# Patient Record
Sex: Male | Born: 1945 | Race: White | Hispanic: No | Smoking: Former smoker
Health system: Southern US, Community
[De-identification: ages and names within clinical notes are randomized; demographics above are authoritative.]

## PROBLEM LIST (undated history)

## (undated) DIAGNOSIS — R0601 Orthopnea: Secondary | ICD-10-CM

## (undated) DIAGNOSIS — I5032 Chronic diastolic (congestive) heart failure: Secondary | ICD-10-CM

## (undated) DIAGNOSIS — M542 Cervicalgia: Secondary | ICD-10-CM

## (undated) DIAGNOSIS — M5090 Cervical disc disorder, unspecified, unspecified cervical region: Secondary | ICD-10-CM

## (undated) DIAGNOSIS — E039 Hypothyroidism, unspecified: Secondary | ICD-10-CM

## (undated) DIAGNOSIS — M549 Dorsalgia, unspecified: Secondary | ICD-10-CM

## (undated) DIAGNOSIS — I2699 Other pulmonary embolism without acute cor pulmonale: Secondary | ICD-10-CM

## (undated) DIAGNOSIS — I2510 Atherosclerotic heart disease of native coronary artery without angina pectoris: Secondary | ICD-10-CM

## (undated) DIAGNOSIS — I119 Hypertensive heart disease without heart failure: Secondary | ICD-10-CM

## (undated) DIAGNOSIS — L309 Dermatitis, unspecified: Secondary | ICD-10-CM

## (undated) DIAGNOSIS — D649 Anemia, unspecified: Secondary | ICD-10-CM

## (undated) DIAGNOSIS — I499 Cardiac arrhythmia, unspecified: Secondary | ICD-10-CM

## (undated) DIAGNOSIS — I739 Peripheral vascular disease, unspecified: Secondary | ICD-10-CM

## (undated) DIAGNOSIS — S82409A Unspecified fracture of shaft of unspecified fibula, initial encounter for closed fracture: Secondary | ICD-10-CM

## (undated) DIAGNOSIS — F329 Major depressive disorder, single episode, unspecified: Secondary | ICD-10-CM

## (undated) DIAGNOSIS — I4892 Unspecified atrial flutter: Secondary | ICD-10-CM

## (undated) DIAGNOSIS — F419 Anxiety disorder, unspecified: Secondary | ICD-10-CM

## (undated) DIAGNOSIS — F32A Depression, unspecified: Secondary | ICD-10-CM

## (undated) DIAGNOSIS — I639 Cerebral infarction, unspecified: Secondary | ICD-10-CM

## (undated) DIAGNOSIS — S82892A Other fracture of left lower leg, initial encounter for closed fracture: Secondary | ICD-10-CM

## (undated) DIAGNOSIS — R011 Cardiac murmur, unspecified: Secondary | ICD-10-CM

## (undated) DIAGNOSIS — N183 Chronic kidney disease, stage 3 unspecified: Secondary | ICD-10-CM

## (undated) DIAGNOSIS — I480 Paroxysmal atrial fibrillation: Secondary | ICD-10-CM

## (undated) DIAGNOSIS — G629 Polyneuropathy, unspecified: Secondary | ICD-10-CM

## (undated) DIAGNOSIS — G4733 Obstructive sleep apnea (adult) (pediatric): Secondary | ICD-10-CM

## (undated) DIAGNOSIS — R609 Edema, unspecified: Secondary | ICD-10-CM

## (undated) DIAGNOSIS — F4310 Post-traumatic stress disorder, unspecified: Secondary | ICD-10-CM

## (undated) DIAGNOSIS — R0600 Dyspnea, unspecified: Secondary | ICD-10-CM

## (undated) DIAGNOSIS — I779 Disorder of arteries and arterioles, unspecified: Secondary | ICD-10-CM

## (undated) DIAGNOSIS — M1990 Unspecified osteoarthritis, unspecified site: Secondary | ICD-10-CM

## (undated) DIAGNOSIS — N200 Calculus of kidney: Secondary | ICD-10-CM

## (undated) DIAGNOSIS — Z972 Presence of dental prosthetic device (complete) (partial): Secondary | ICD-10-CM

## (undated) DIAGNOSIS — G8929 Other chronic pain: Secondary | ICD-10-CM

## (undated) HISTORY — DX: Other fracture of left lower leg, initial encounter for closed fracture: S82.892A

## (undated) HISTORY — DX: Paroxysmal atrial fibrillation: I48.0

## (undated) HISTORY — DX: Hypertensive heart disease without heart failure: I11.9

## (undated) HISTORY — DX: Atherosclerotic heart disease of native coronary artery without angina pectoris: I25.10

## (undated) HISTORY — DX: Polyneuropathy, unspecified: G62.9

## (undated) HISTORY — DX: Calculus of kidney: N20.0

## (undated) HISTORY — DX: Chronic diastolic (congestive) heart failure: I50.32

## (undated) HISTORY — DX: Cervical disc disorder, unspecified, unspecified cervical region: M50.90

## (undated) HISTORY — DX: Dorsalgia, unspecified: M54.9

## (undated) HISTORY — DX: Chronic kidney disease, stage 3 (moderate): N18.3

## (undated) HISTORY — DX: Unspecified atrial flutter: I48.92

## (undated) HISTORY — DX: Chronic kidney disease, stage 3 unspecified: N18.30

## (undated) HISTORY — DX: Peripheral vascular disease, unspecified: I73.9

## (undated) HISTORY — PX: HEMORRHOID SURGERY: SHX153

## (undated) HISTORY — PX: HERNIA REPAIR: SHX51

## (undated) HISTORY — DX: Post-traumatic stress disorder, unspecified: F43.10

## (undated) HISTORY — DX: Other chronic pain: G89.29

## (undated) HISTORY — DX: Cervicalgia: M54.2

## (undated) HISTORY — DX: Obstructive sleep apnea (adult) (pediatric): G47.33

## (undated) HISTORY — PX: ROTATOR CUFF REPAIR: SHX139

## (undated) HISTORY — DX: Disorder of arteries and arterioles, unspecified: I77.9

## (undated) HISTORY — PX: ABLATION: SHX5711

## (undated) HISTORY — DX: Unspecified fracture of shaft of unspecified fibula, initial encounter for closed fracture: S82.409A

---

## 1993-11-29 HISTORY — PX: CORONARY ARTERY BYPASS GRAFT: SHX141

## 1994-06-21 ENCOUNTER — Encounter: Payer: Self-pay | Admitting: Cardiology

## 2001-05-12 ENCOUNTER — Inpatient Hospital Stay (HOSPITAL_COMMUNITY): Admission: EM | Admit: 2001-05-12 | Discharge: 2001-05-15 | Payer: Self-pay | Admitting: Emergency Medicine

## 2001-05-12 ENCOUNTER — Encounter: Payer: Self-pay | Admitting: Emergency Medicine

## 2004-09-28 ENCOUNTER — Ambulatory Visit: Payer: Self-pay | Admitting: Oncology

## 2004-12-23 ENCOUNTER — Ambulatory Visit: Payer: Self-pay | Admitting: Oncology

## 2004-12-30 ENCOUNTER — Ambulatory Visit: Payer: Self-pay | Admitting: Oncology

## 2005-04-23 ENCOUNTER — Ambulatory Visit: Payer: Self-pay | Admitting: Internal Medicine

## 2005-04-29 ENCOUNTER — Ambulatory Visit: Payer: Self-pay | Admitting: Internal Medicine

## 2005-08-20 ENCOUNTER — Ambulatory Visit: Payer: Self-pay | Admitting: Oncology

## 2005-08-29 ENCOUNTER — Ambulatory Visit: Payer: Self-pay | Admitting: Oncology

## 2006-02-16 ENCOUNTER — Ambulatory Visit: Payer: Self-pay | Admitting: Oncology

## 2006-02-27 ENCOUNTER — Ambulatory Visit: Payer: Self-pay | Admitting: Oncology

## 2006-03-29 ENCOUNTER — Ambulatory Visit: Payer: Self-pay | Admitting: Oncology

## 2006-06-30 ENCOUNTER — Ambulatory Visit: Payer: Self-pay | Admitting: Oncology

## 2006-07-30 ENCOUNTER — Ambulatory Visit: Payer: Self-pay | Admitting: Oncology

## 2006-09-05 ENCOUNTER — Ambulatory Visit: Payer: Self-pay | Admitting: Oncology

## 2006-09-29 ENCOUNTER — Ambulatory Visit: Payer: Self-pay | Admitting: Oncology

## 2006-10-27 ENCOUNTER — Other Ambulatory Visit: Payer: Self-pay

## 2006-10-27 ENCOUNTER — Inpatient Hospital Stay: Payer: Self-pay | Admitting: Internal Medicine

## 2006-12-05 ENCOUNTER — Ambulatory Visit: Payer: Self-pay | Admitting: Oncology

## 2006-12-30 ENCOUNTER — Ambulatory Visit: Payer: Self-pay | Admitting: Oncology

## 2007-01-28 ENCOUNTER — Ambulatory Visit: Payer: Self-pay | Admitting: Internal Medicine

## 2007-02-07 ENCOUNTER — Ambulatory Visit: Payer: Self-pay | Admitting: Oncology

## 2007-02-14 ENCOUNTER — Other Ambulatory Visit: Payer: Self-pay

## 2007-02-14 ENCOUNTER — Emergency Department: Payer: Self-pay | Admitting: Emergency Medicine

## 2007-02-28 ENCOUNTER — Ambulatory Visit: Payer: Self-pay | Admitting: Oncology

## 2007-02-28 ENCOUNTER — Ambulatory Visit: Payer: Self-pay | Admitting: Internal Medicine

## 2007-03-30 ENCOUNTER — Ambulatory Visit: Payer: Self-pay | Admitting: Oncology

## 2007-03-30 ENCOUNTER — Ambulatory Visit: Payer: Self-pay | Admitting: Family Medicine

## 2007-03-30 ENCOUNTER — Ambulatory Visit: Payer: Self-pay | Admitting: Internal Medicine

## 2007-03-31 ENCOUNTER — Ambulatory Visit: Payer: Self-pay | Admitting: Cardiology

## 2007-04-30 ENCOUNTER — Ambulatory Visit: Payer: Self-pay | Admitting: Internal Medicine

## 2007-05-02 ENCOUNTER — Ambulatory Visit: Payer: Self-pay | Admitting: Internal Medicine

## 2007-05-17 ENCOUNTER — Encounter: Payer: Self-pay | Admitting: Family Medicine

## 2007-05-30 ENCOUNTER — Encounter: Payer: Self-pay | Admitting: Family Medicine

## 2007-05-30 ENCOUNTER — Ambulatory Visit: Payer: Self-pay | Admitting: Internal Medicine

## 2007-06-13 ENCOUNTER — Ambulatory Visit: Payer: Self-pay | Admitting: Internal Medicine

## 2007-06-30 ENCOUNTER — Encounter: Payer: Self-pay | Admitting: Family Medicine

## 2007-06-30 ENCOUNTER — Ambulatory Visit: Payer: Self-pay | Admitting: Internal Medicine

## 2007-07-31 ENCOUNTER — Ambulatory Visit: Payer: Self-pay | Admitting: Internal Medicine

## 2007-08-30 ENCOUNTER — Ambulatory Visit: Payer: Self-pay | Admitting: Internal Medicine

## 2007-09-30 ENCOUNTER — Ambulatory Visit: Payer: Self-pay | Admitting: Internal Medicine

## 2007-10-30 ENCOUNTER — Ambulatory Visit: Payer: Self-pay | Admitting: Internal Medicine

## 2007-11-30 ENCOUNTER — Ambulatory Visit: Payer: Self-pay | Admitting: Internal Medicine

## 2007-12-05 ENCOUNTER — Ambulatory Visit: Payer: Self-pay | Admitting: Internal Medicine

## 2007-12-31 ENCOUNTER — Ambulatory Visit: Payer: Self-pay | Admitting: Internal Medicine

## 2008-01-02 ENCOUNTER — Ambulatory Visit: Payer: Self-pay | Admitting: Family Medicine

## 2008-01-09 ENCOUNTER — Ambulatory Visit: Payer: Self-pay | Admitting: Internal Medicine

## 2008-01-28 ENCOUNTER — Ambulatory Visit: Payer: Self-pay | Admitting: Internal Medicine

## 2008-02-28 ENCOUNTER — Ambulatory Visit: Payer: Self-pay | Admitting: Internal Medicine

## 2008-03-29 ENCOUNTER — Ambulatory Visit: Payer: Self-pay | Admitting: Internal Medicine

## 2008-04-29 ENCOUNTER — Ambulatory Visit: Payer: Self-pay | Admitting: Internal Medicine

## 2008-05-29 ENCOUNTER — Ambulatory Visit: Payer: Self-pay | Admitting: Internal Medicine

## 2008-05-29 ENCOUNTER — Ambulatory Visit: Payer: Self-pay | Admitting: Urology

## 2008-06-12 ENCOUNTER — Ambulatory Visit: Payer: Self-pay | Admitting: Urology

## 2008-06-29 ENCOUNTER — Ambulatory Visit: Payer: Self-pay | Admitting: Internal Medicine

## 2008-07-23 ENCOUNTER — Ambulatory Visit: Payer: Self-pay | Admitting: Internal Medicine

## 2008-07-30 ENCOUNTER — Ambulatory Visit: Payer: Self-pay | Admitting: Internal Medicine

## 2008-08-29 ENCOUNTER — Ambulatory Visit: Payer: Self-pay | Admitting: Internal Medicine

## 2008-09-29 ENCOUNTER — Ambulatory Visit: Payer: Self-pay | Admitting: Internal Medicine

## 2008-10-29 ENCOUNTER — Ambulatory Visit: Payer: Self-pay | Admitting: Internal Medicine

## 2008-10-31 ENCOUNTER — Ambulatory Visit: Payer: Self-pay | Admitting: Internal Medicine

## 2008-11-26 ENCOUNTER — Inpatient Hospital Stay: Payer: Self-pay | Admitting: Specialist

## 2008-11-26 ENCOUNTER — Ambulatory Visit: Payer: Self-pay | Admitting: Cardiovascular Disease

## 2008-11-29 ENCOUNTER — Ambulatory Visit: Payer: Medicare Other | Admitting: Internal Medicine

## 2008-11-29 DIAGNOSIS — I2699 Other pulmonary embolism without acute cor pulmonale: Secondary | ICD-10-CM

## 2008-11-29 HISTORY — DX: Other pulmonary embolism without acute cor pulmonale: I26.99

## 2008-12-02 ENCOUNTER — Ambulatory Visit: Payer: Self-pay | Admitting: Cardiovascular Disease

## 2008-12-02 LAB — CONVERTED CEMR LAB
MCV: 90.1 fL (ref 78.0–100.0)
Platelets: 162 10*3/uL (ref 150–400)
WBC: 8.3 10*3/uL (ref 4.0–10.5)

## 2008-12-09 ENCOUNTER — Ambulatory Visit: Payer: Self-pay | Admitting: Internal Medicine

## 2008-12-13 ENCOUNTER — Inpatient Hospital Stay: Payer: Medicare Other | Admitting: Internal Medicine

## 2008-12-30 ENCOUNTER — Ambulatory Visit: Payer: Medicare Other | Admitting: Internal Medicine

## 2009-01-02 ENCOUNTER — Ambulatory Visit: Payer: Medicare Other | Admitting: Family Medicine

## 2009-01-06 ENCOUNTER — Ambulatory Visit: Payer: Self-pay | Admitting: Cardiology

## 2009-01-06 ENCOUNTER — Inpatient Hospital Stay: Payer: Medicare Other | Admitting: Internal Medicine

## 2009-01-09 ENCOUNTER — Ambulatory Visit: Payer: Self-pay | Admitting: Cardiology

## 2009-01-09 ENCOUNTER — Ambulatory Visit: Payer: Self-pay | Admitting: Pulmonary Disease

## 2009-01-09 ENCOUNTER — Inpatient Hospital Stay (HOSPITAL_COMMUNITY): Admission: AD | Admit: 2009-01-09 | Discharge: 2009-01-26 | Payer: Self-pay | Admitting: Pulmonary Disease

## 2009-01-10 ENCOUNTER — Ambulatory Visit: Payer: Medicare Other | Admitting: Internal Medicine

## 2009-01-14 ENCOUNTER — Encounter: Payer: Self-pay | Admitting: Cardiology

## 2009-01-16 ENCOUNTER — Encounter: Payer: Self-pay | Admitting: Internal Medicine

## 2009-01-16 ENCOUNTER — Ambulatory Visit: Payer: Self-pay | Admitting: Vascular Surgery

## 2009-01-24 ENCOUNTER — Encounter: Payer: Self-pay | Admitting: Cardiology

## 2009-01-27 ENCOUNTER — Ambulatory Visit: Payer: Medicare Other | Admitting: Internal Medicine

## 2009-01-30 ENCOUNTER — Ambulatory Visit: Payer: Medicare Other | Admitting: Internal Medicine

## 2009-01-31 ENCOUNTER — Ambulatory Visit: Payer: Self-pay | Admitting: Internal Medicine

## 2009-01-31 ENCOUNTER — Ambulatory Visit: Payer: Medicare Other | Admitting: Family Medicine

## 2009-02-17 ENCOUNTER — Ambulatory Visit: Payer: Self-pay | Admitting: Internal Medicine

## 2009-02-25 ENCOUNTER — Ambulatory Visit: Payer: Medicare Other | Admitting: Family Medicine

## 2009-02-27 ENCOUNTER — Ambulatory Visit: Payer: Medicare Other | Admitting: Internal Medicine

## 2009-03-28 ENCOUNTER — Ambulatory Visit: Payer: Medicare Other | Admitting: Family Medicine

## 2009-03-29 ENCOUNTER — Ambulatory Visit: Payer: Medicare Other | Admitting: Internal Medicine

## 2009-04-03 ENCOUNTER — Ambulatory Visit: Payer: Medicare Other | Admitting: Internal Medicine

## 2009-04-07 ENCOUNTER — Ambulatory Visit: Payer: Medicare Other | Admitting: Family Medicine

## 2009-04-29 ENCOUNTER — Ambulatory Visit: Payer: Medicare Other | Admitting: Internal Medicine

## 2009-05-01 ENCOUNTER — Ambulatory Visit: Payer: Medicare Other | Admitting: Internal Medicine

## 2009-05-27 ENCOUNTER — Ambulatory Visit: Payer: Medicare Other | Admitting: Family Medicine

## 2009-05-29 ENCOUNTER — Ambulatory Visit: Payer: Medicare Other | Admitting: Internal Medicine

## 2009-06-16 ENCOUNTER — Ambulatory Visit: Payer: Self-pay | Admitting: Cardiovascular Disease

## 2009-06-16 DIAGNOSIS — I10 Essential (primary) hypertension: Secondary | ICD-10-CM

## 2009-06-16 DIAGNOSIS — M79609 Pain in unspecified limb: Secondary | ICD-10-CM

## 2009-06-16 DIAGNOSIS — I4892 Unspecified atrial flutter: Secondary | ICD-10-CM

## 2009-06-16 DIAGNOSIS — I25810 Atherosclerosis of coronary artery bypass graft(s) without angina pectoris: Secondary | ICD-10-CM | POA: Insufficient documentation

## 2009-06-29 ENCOUNTER — Ambulatory Visit: Payer: Medicare Other | Admitting: Internal Medicine

## 2009-07-14 ENCOUNTER — Encounter: Payer: Self-pay | Admitting: *Deleted

## 2009-07-16 ENCOUNTER — Other Ambulatory Visit: Payer: Medicare Other | Admitting: Family Medicine

## 2009-07-18 ENCOUNTER — Ambulatory Visit: Payer: Medicare Other | Admitting: Internal Medicine

## 2009-07-30 ENCOUNTER — Ambulatory Visit: Payer: Medicare Other | Admitting: Internal Medicine

## 2009-08-11 ENCOUNTER — Encounter: Payer: Self-pay | Admitting: Cardiology

## 2009-08-12 ENCOUNTER — Ambulatory Visit: Payer: Self-pay | Admitting: Cardiology

## 2009-08-12 DIAGNOSIS — I5032 Chronic diastolic (congestive) heart failure: Secondary | ICD-10-CM

## 2009-08-12 DIAGNOSIS — R609 Edema, unspecified: Secondary | ICD-10-CM

## 2009-08-21 ENCOUNTER — Encounter: Payer: Self-pay | Admitting: Cardiology

## 2009-08-26 ENCOUNTER — Ambulatory Visit: Payer: Self-pay | Admitting: Cardiovascular Disease

## 2009-08-26 ENCOUNTER — Encounter: Payer: Self-pay | Admitting: Cardiology

## 2009-08-26 ENCOUNTER — Ambulatory Visit: Payer: Self-pay

## 2009-08-28 ENCOUNTER — Encounter (INDEPENDENT_AMBULATORY_CARE_PROVIDER_SITE_OTHER): Payer: Self-pay | Admitting: *Deleted

## 2009-08-28 LAB — CONVERTED CEMR LAB
Chloride: 98 meq/L (ref 96–112)
Creatinine, Ser: 1.93 mg/dL — ABNORMAL HIGH (ref 0.40–1.50)

## 2009-08-29 ENCOUNTER — Ambulatory Visit: Payer: Medicare Other | Admitting: Internal Medicine

## 2009-09-08 ENCOUNTER — Ambulatory Visit: Payer: Self-pay | Admitting: Cardiology

## 2009-09-08 DIAGNOSIS — I4891 Unspecified atrial fibrillation: Secondary | ICD-10-CM | POA: Insufficient documentation

## 2009-09-08 DIAGNOSIS — I6529 Occlusion and stenosis of unspecified carotid artery: Secondary | ICD-10-CM

## 2009-09-12 ENCOUNTER — Ambulatory Visit: Payer: Self-pay | Admitting: Internal Medicine

## 2009-09-12 LAB — CONVERTED CEMR LAB: POC INR: 1.3

## 2009-09-15 LAB — CONVERTED CEMR LAB
ALT: 14 units/L (ref 0–53)
AST: 17 units/L (ref 0–37)
Alkaline Phosphatase: 83 units/L (ref 39–117)
Bilirubin, Direct: <0.1 mg/dL (ref 0.0–0.3)
Cholesterol: 160 mg/dL (ref 0–200)
Total CHOL/HDL Ratio: 4.2

## 2009-09-16 ENCOUNTER — Ambulatory Visit: Payer: Self-pay

## 2009-09-16 ENCOUNTER — Encounter: Payer: Self-pay | Admitting: Cardiology

## 2009-09-19 ENCOUNTER — Ambulatory Visit: Payer: Self-pay | Admitting: Cardiology

## 2009-09-19 LAB — CONVERTED CEMR LAB: POC INR: 2.6

## 2009-09-29 ENCOUNTER — Ambulatory Visit: Payer: Medicare Other | Admitting: Internal Medicine

## 2009-10-06 ENCOUNTER — Ambulatory Visit: Payer: Self-pay | Admitting: Cardiovascular Disease

## 2009-10-07 ENCOUNTER — Ambulatory Visit: Payer: Medicare Other | Admitting: Internal Medicine

## 2009-10-15 ENCOUNTER — Ambulatory Visit: Payer: Self-pay | Admitting: Internal Medicine

## 2009-10-17 ENCOUNTER — Telehealth (INDEPENDENT_AMBULATORY_CARE_PROVIDER_SITE_OTHER): Payer: Self-pay | Admitting: *Deleted

## 2009-10-29 ENCOUNTER — Ambulatory Visit: Payer: Medicare Other | Admitting: Internal Medicine

## 2009-11-05 ENCOUNTER — Ambulatory Visit: Payer: Self-pay | Admitting: Internal Medicine

## 2009-11-05 LAB — CONVERTED CEMR LAB

## 2009-11-10 IMAGING — CT CT HEAD WITHOUT CONTRAST
1 series · 16 of 30 positions shown, 20 images · non-contrast
Comparison: none

REASON FOR EXAM: headaches
COMMENTS:

PROCEDURE:     CT  - CT HEAD WITHOUT CONTRAST  - January 31, 2009  [DATE]
RESULT:
COMPARISON STUDIES:  Comparison to prior CT of 01/02/2009.
TECHNIQUE: Nonenhanced head CT is obtained.

[Series 2: soft tissue · axial · 0.41mm/px · z∈[-606,-462]mm · 16 of 33 slices shown, 20 images]
[im 2/33  brain]
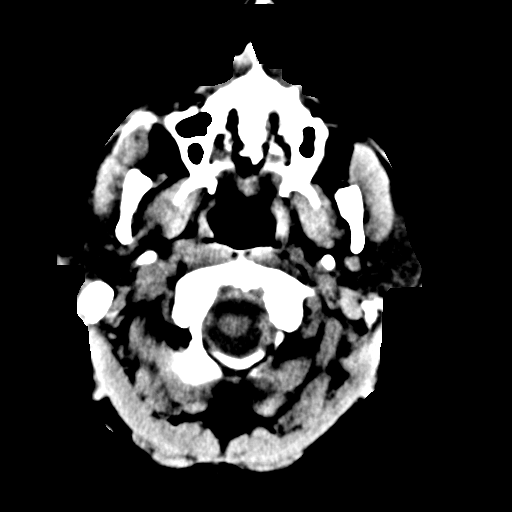
[im 2/33  bone]
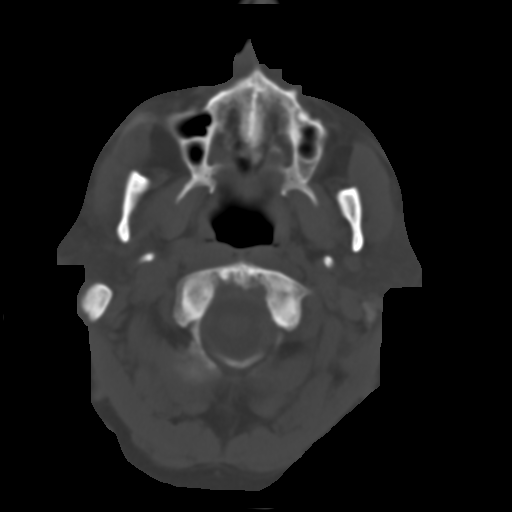
[im 4/33  brain]
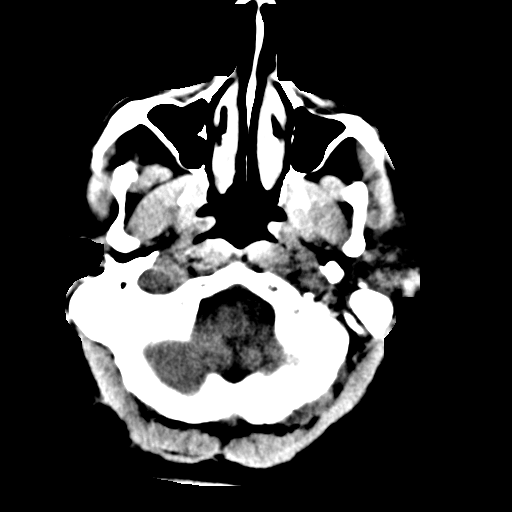
[im 6/33  brain]
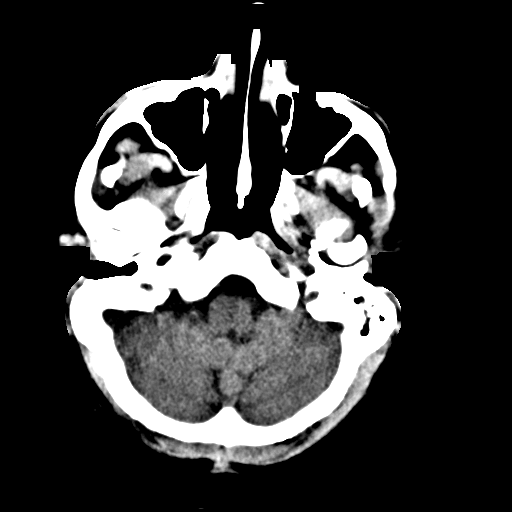
[im 8/33  brain]
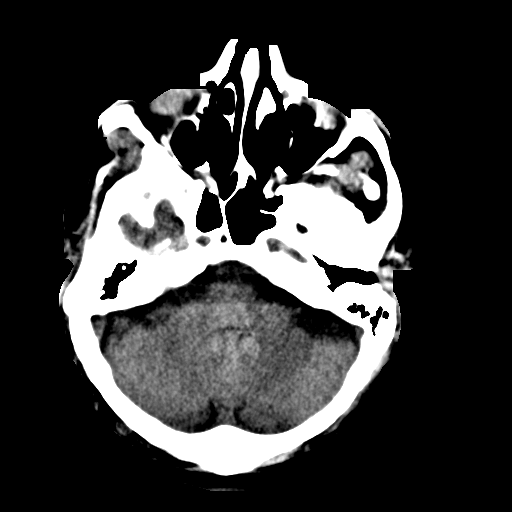
[im 9/33  brain]
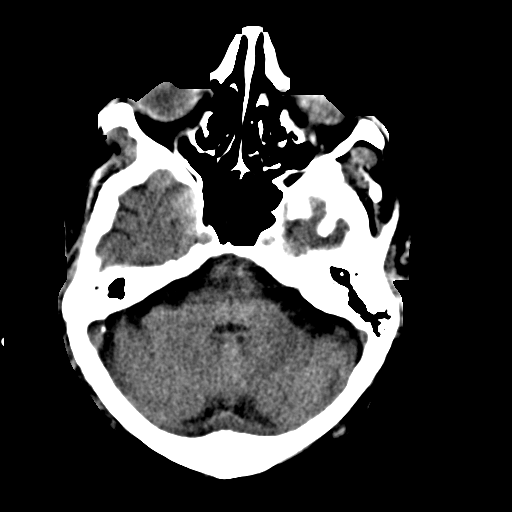
[im 9/33  bone]
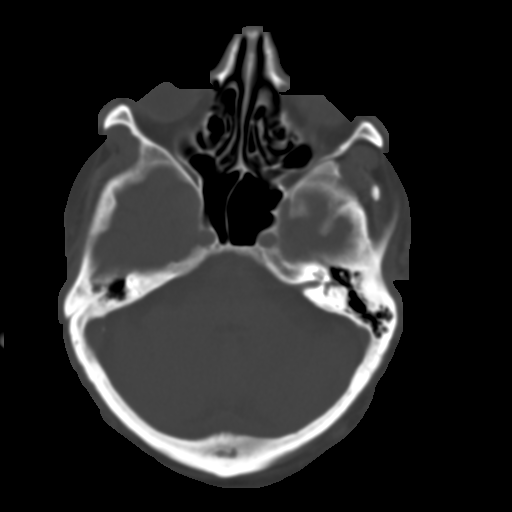
[im 12/33  brain]
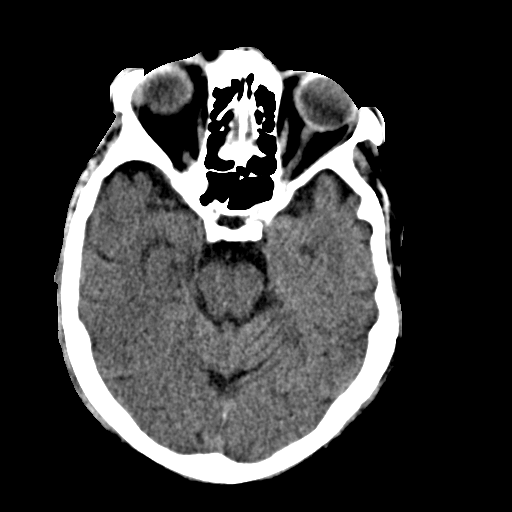
[im 14/33  brain]
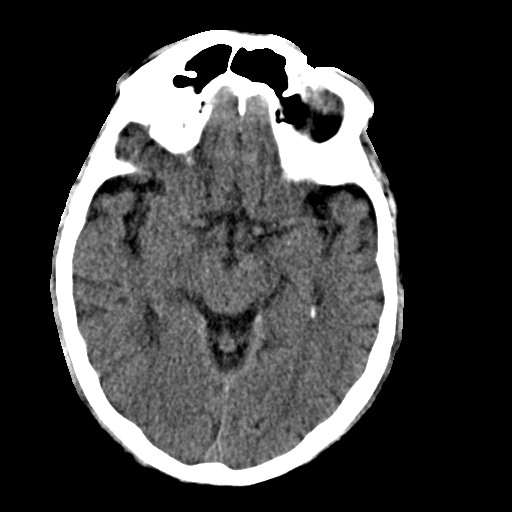
[im 16/33  brain]
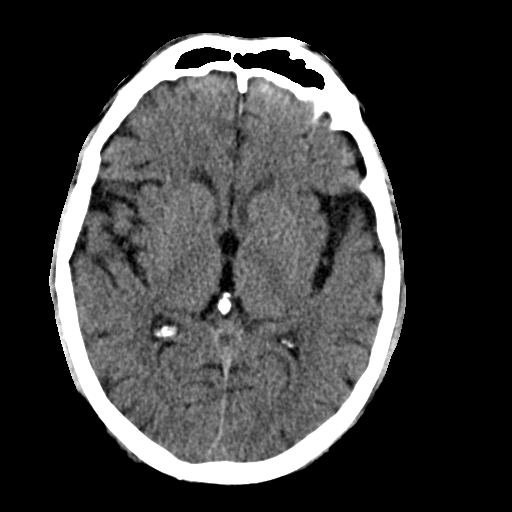
[im 17/33  brain]
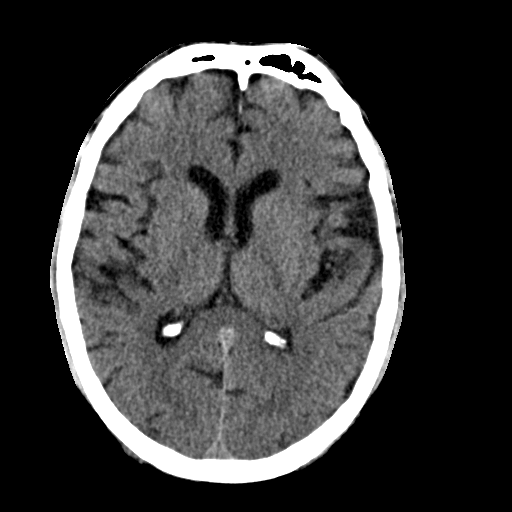
[im 17/33  bone]
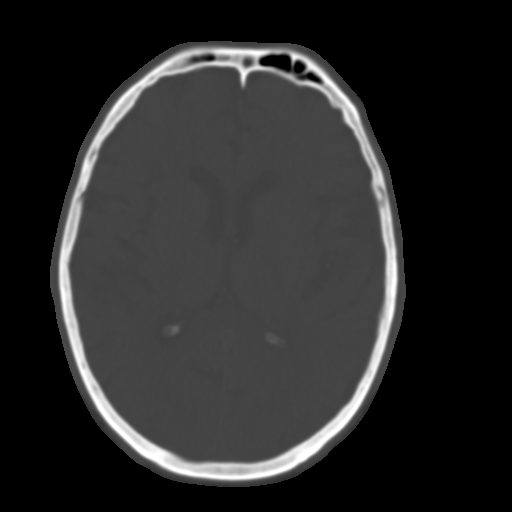
[im 19/33  brain]
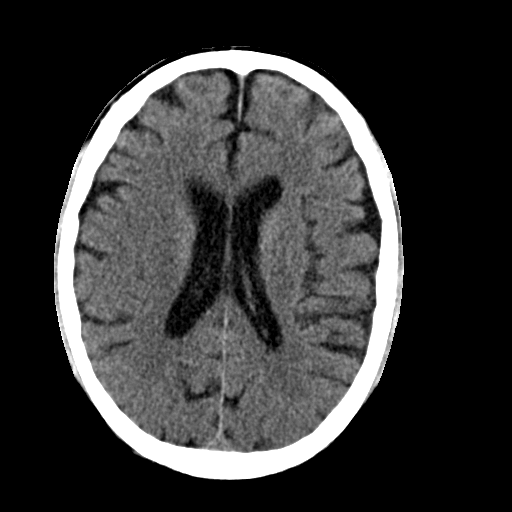
[im 21/33  brain]
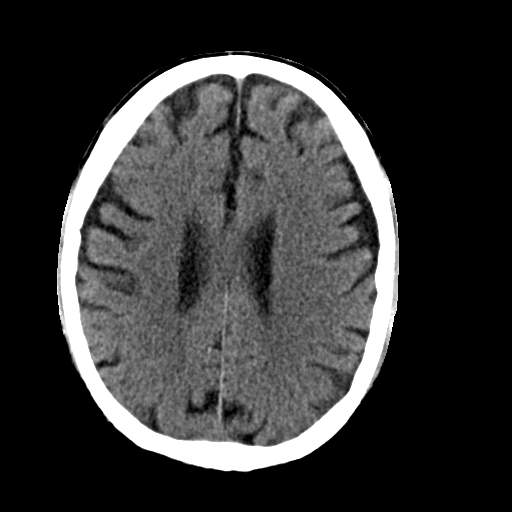
[im 24/33  brain]
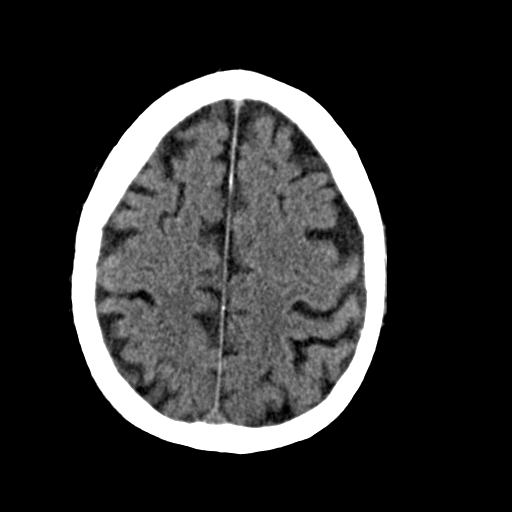
[im 25/33  brain]
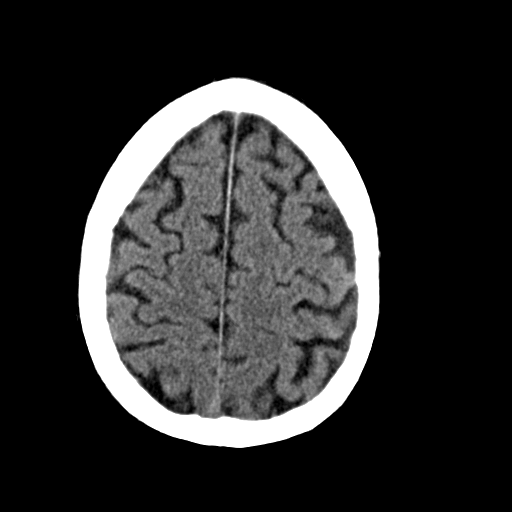
[im 25/33  bone]
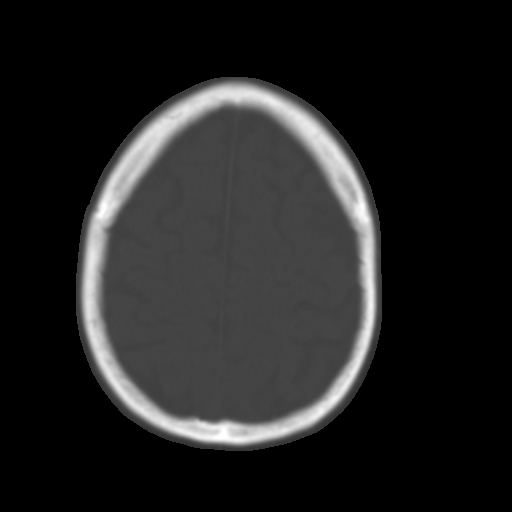
[im 27/33  brain]
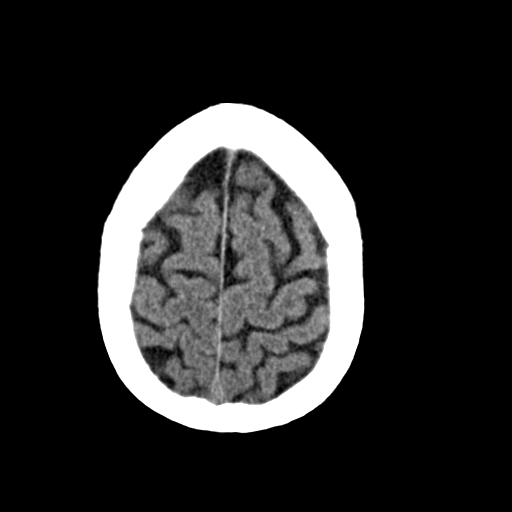
[im 29/33  brain]
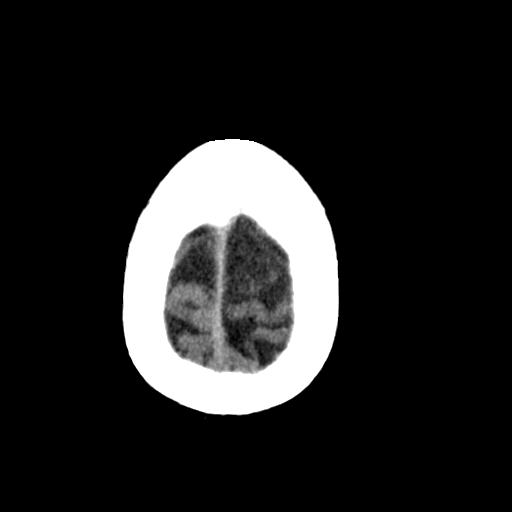
[im 31/33  brain]
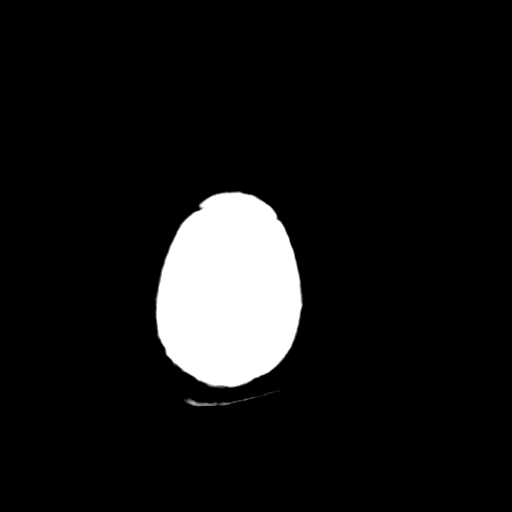

[16 of 30 positions shown; findings below may reference images not displayed]

FINDINGS: No intra-axial or extra-axial pathologic fluid or blood
collections are identified. No mass lesions are noted. There is no
hydrocephalus. No acute bony abnormalities are identified.
IMPRESSION: No acute or focal abnormalities.

## 2009-11-20 ENCOUNTER — Ambulatory Visit: Payer: Self-pay | Admitting: Cardiology

## 2009-11-20 LAB — CONVERTED CEMR LAB: POC INR: 2.1

## 2009-11-29 ENCOUNTER — Ambulatory Visit: Payer: Medicare Other | Admitting: Internal Medicine

## 2009-12-19 ENCOUNTER — Ambulatory Visit: Payer: Self-pay | Admitting: Cardiovascular Disease

## 2009-12-19 LAB — CONVERTED CEMR LAB: POC INR: 1.9

## 2010-01-15 IMAGING — NM NM BONE LIMITED
1 series · 4 of 4 positions shown · non-contrast
Comparison: none

REASON FOR EXAM: right foot  right foot pain and swelling  eval for
stress fx
COMMENTS:

[Series 1000: bone statics · 2.40mm/px · 2 acquisitions, 4 frames shown]
[im 1/2]
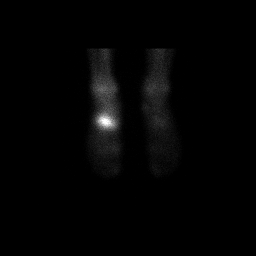
[im 1/2]
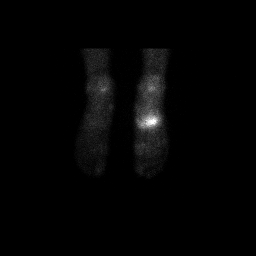
[im 2/2]
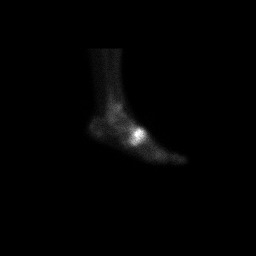
[im 2/2]
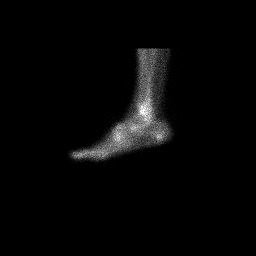

[4 of 4 positions shown; findings below may reference images not displayed]

PROCEDURE:     NM  - NM LIMITED BONE SCAN 3HR [DATE]  [DATE]

RESULT:     The patient has unexplained pain and swelling in the right foot
and is being evaluated for possible occult fracture. The patient received
19.92 mCi of technetium 99m labeled MDP. Adequate soft tissue clearance is
seen. There are no plain films for comparison.

There is increased uptake in the region of the metatarsal bases diffusely.
Mildly increased uptake is noted over the lateral aspect of the right ankle.
Activity in the left foot appears normal.
IMPRESSION: There is intensely increased uptake in the region of the
metatarsal bases diffusely on the right. Correlation with plain films would
be useful.

## 2010-01-20 ENCOUNTER — Ambulatory Visit: Payer: Self-pay | Admitting: Cardiology

## 2010-01-20 LAB — CONVERTED CEMR LAB: POC INR: 1.8

## 2010-01-21 ENCOUNTER — Ambulatory Visit: Payer: Self-pay | Admitting: Cardiology

## 2010-01-21 DIAGNOSIS — R42 Dizziness and giddiness: Secondary | ICD-10-CM

## 2010-01-27 ENCOUNTER — Ambulatory Visit: Payer: Medicare Other | Admitting: Internal Medicine

## 2010-02-18 ENCOUNTER — Ambulatory Visit: Payer: Self-pay | Admitting: Cardiology

## 2010-02-18 LAB — CONVERTED CEMR LAB: POC INR: 1.6

## 2010-02-24 ENCOUNTER — Ambulatory Visit: Payer: Medicare Other | Admitting: Internal Medicine

## 2010-02-25 LAB — CONVERTED CEMR LAB
ALT: 21 units/L (ref 0–53)
AST: 19 units/L (ref 0–37)
Creatinine, Ser: 1.71 mg/dL — ABNORMAL HIGH (ref 0.40–1.50)
Pro B Natriuretic peptide (BNP): 7 pg/mL (ref 0.0–100.0)
Total Bilirubin: 0.4 mg/dL (ref 0.3–1.2)
Total CHOL/HDL Ratio: 4.5
VLDL: 46 mg/dL — ABNORMAL HIGH (ref 0–40)

## 2010-02-27 ENCOUNTER — Ambulatory Visit: Payer: Medicare Other | Admitting: Internal Medicine

## 2010-03-04 ENCOUNTER — Ambulatory Visit: Payer: Self-pay | Admitting: Cardiovascular Disease

## 2010-03-04 LAB — CONVERTED CEMR LAB: POC INR: 2.2

## 2010-03-10 ENCOUNTER — Encounter: Payer: Self-pay | Admitting: Cardiology

## 2010-04-01 ENCOUNTER — Ambulatory Visit: Payer: Self-pay | Admitting: Cardiovascular Disease

## 2010-04-21 ENCOUNTER — Ambulatory Visit: Payer: Self-pay | Admitting: Cardiovascular Disease

## 2010-05-19 ENCOUNTER — Ambulatory Visit: Payer: Self-pay | Admitting: Cardiovascular Disease

## 2010-06-10 ENCOUNTER — Encounter (INDEPENDENT_AMBULATORY_CARE_PROVIDER_SITE_OTHER): Payer: Self-pay | Admitting: *Deleted

## 2010-06-10 ENCOUNTER — Telehealth: Payer: Self-pay | Admitting: Cardiology

## 2010-06-11 ENCOUNTER — Inpatient Hospital Stay: Payer: Medicare Other | Admitting: Internal Medicine

## 2010-06-11 ENCOUNTER — Ambulatory Visit: Payer: Self-pay | Admitting: Cardiovascular Disease

## 2010-06-22 ENCOUNTER — Ambulatory Visit: Payer: Self-pay | Admitting: Cardiology

## 2010-06-24 ENCOUNTER — Encounter: Payer: Self-pay | Admitting: Cardiology

## 2010-06-24 DIAGNOSIS — R269 Unspecified abnormalities of gait and mobility: Secondary | ICD-10-CM

## 2010-06-29 ENCOUNTER — Ambulatory Visit: Payer: Medicare Other | Admitting: Internal Medicine

## 2010-06-30 ENCOUNTER — Ambulatory Visit: Payer: Medicare Other | Admitting: Internal Medicine

## 2010-07-01 ENCOUNTER — Telehealth: Payer: Self-pay | Admitting: Cardiology

## 2010-07-03 ENCOUNTER — Ambulatory Visit: Payer: Medicare Other | Admitting: Family Medicine

## 2010-07-08 ENCOUNTER — Encounter: Payer: Self-pay | Admitting: Cardiovascular Disease

## 2010-07-21 ENCOUNTER — Ambulatory Visit: Payer: Self-pay | Admitting: Cardiology

## 2010-07-27 LAB — CONVERTED CEMR LAB
AST: 23 units/L (ref 0–37)
Bilirubin, Direct: 0.1 mg/dL (ref 0.0–0.3)
CO2: 28 meq/L (ref 19–32)
Calcium: 8.2 mg/dL — ABNORMAL LOW (ref 8.4–10.5)
Chloride: 102 meq/L (ref 96–112)
Creatinine, Ser: 1.67 mg/dL — ABNORMAL HIGH (ref 0.40–1.50)
Glucose, Bld: 133 mg/dL — ABNORMAL HIGH (ref 70–99)
LDL Cholesterol: 68 mg/dL (ref 0–99)
Sodium: 140 meq/L (ref 135–145)
Total Bilirubin: 0.3 mg/dL (ref 0.3–1.2)
Total CHOL/HDL Ratio: 2.8
Vitamin B-12: 730 pg/mL (ref 211–911)

## 2010-07-28 ENCOUNTER — Ambulatory Visit: Payer: Self-pay | Admitting: Cardiology

## 2010-07-30 ENCOUNTER — Ambulatory Visit: Payer: Medicare Other | Admitting: Internal Medicine

## 2010-08-04 ENCOUNTER — Encounter: Payer: Self-pay | Admitting: Cardiology

## 2010-08-21 ENCOUNTER — Encounter: Payer: Self-pay | Admitting: Cardiology

## 2010-08-21 ENCOUNTER — Encounter: Admission: RE | Admit: 2010-08-21 | Discharge: 2010-08-21 | Payer: Self-pay | Admitting: Diagnostic Neuroimaging

## 2010-08-21 ENCOUNTER — Encounter: Admission: RE | Admit: 2010-08-21 | Discharge: 2010-08-21 | Payer: Self-pay | Admitting: Neurosurgery

## 2010-10-20 ENCOUNTER — Ambulatory Visit: Payer: Medicare Other | Admitting: Internal Medicine

## 2010-10-29 ENCOUNTER — Ambulatory Visit: Payer: Medicare Other | Admitting: Internal Medicine

## 2010-11-04 ENCOUNTER — Ambulatory Visit: Payer: Self-pay | Admitting: Cardiology

## 2010-11-09 ENCOUNTER — Ambulatory Visit: Payer: Self-pay | Admitting: Cardiology

## 2010-11-09 ENCOUNTER — Encounter: Payer: Self-pay | Admitting: Cardiology

## 2010-11-11 LAB — CONVERTED CEMR LAB
BUN: 21 mg/dL (ref 6–23)
CO2: 30 meq/L (ref 19–32)
Calcium: 9.2 mg/dL (ref 8.4–10.5)
Chloride: 100 meq/L (ref 96–112)
Glucose, Bld: 175 mg/dL — ABNORMAL HIGH (ref 70–99)
Lymphocytes Relative: 23 % (ref 12–46)
Lymphs Abs: 1.7 10*3/uL (ref 0.7–4.0)
Neutrophils Relative %: 60 % (ref 43–77)
Platelets: 169 10*3/uL (ref 150–400)
Potassium: 4.4 meq/L (ref 3.5–5.3)
WBC: 7.4 10*3/uL (ref 4.0–10.5)

## 2010-12-15 ENCOUNTER — Telehealth (INDEPENDENT_AMBULATORY_CARE_PROVIDER_SITE_OTHER): Payer: Self-pay | Admitting: *Deleted

## 2010-12-27 LAB — CONVERTED CEMR LAB: Pro B Natriuretic peptide (BNP): 43 pg/mL

## 2010-12-31 NOTE — Assessment & Plan Note (Signed)
Summary: F3M/AMD   Visit Type:  Follow-up Primary Provider:  Julieanne Manson, MD  CC:  c/o chest muscle tightness, has fallen recently- states that he "blacks out", and lightheaded and dizzy esp when getting up in the morning.  History of Present Illness: 65 yo with history of CAD s/p CABG 1995, atrial fib/flutter s/p atrial flutter ablation 2/10, HTN, and diastolic CHF returns for followup.  Patient is symptomatically stable.  He continues to have baseline imbalance and walks with a cane.  He has long-standing orthostatic-type symptoms (gets lightheaded if he stands up too fast).  He fell once 3 weeks ago after getting lightheaded soon after standing up.  Prior to that episode, last fall was about a year ago.  We checked orthostatics today, and though he was lightheaded with standing, he was not orthostatic by BP or pulse.  He is on coumadin with goal INR 2-2.5 for paroxysmal atrial fibrillation. He has had no significant bleeding. No chest pain episodes.  He is chronically short of breath after walking about 200 feet.  No orthopnea or PND.   Labs (9/10): K 4.7, creatinine 1.93.  Labs (10/10): HDL 38, LDL 86  ECG: NSR, nonspecific T wave inversions   Current Medications (verified): 1)  Prazosin Hcl 5 Mg Caps (Prazosin Hcl) .... One Tab At Noon/ Two Tabs At Bedtime 2)  Omeprazole 20 Mg Tbec (Omeprazole) .... One Tab By Mouth Once Daily 3)  Niacin Cr 1000 Mg Cr-Tabs (Niacin) .... One Tab By Mouth Once Daily 4)  Methadone Hcl 10 Mg Tabs (Methadone Hcl) .... One Tab 10mg  Three Times A Day 5)  Lisinopril 10 Mg Tabs (Lisinopril) .... Take One Tablet By Mouth Daily 6)  Clonazepam 1 Mg Tabs (Clonazepam) .... One Tab Once Daily At [facility] and  Two Tabs At Bedtime 7)  Bupropion Hcl 150 Mg Xr24h-Tab (Bupropion Hcl) .... One Tab By Mouth Two Times A Day 8)  Docusate Sodium 100 Mg Caps (Docusate Sodium) .... 3 Tabs By Mouth Once Daily 9)  Simvastatin 40 Mg Tabs (Simvastatin) .... Take One Tablet By Mouth  Daily At Bedtime 10)  Aspirin 81 Mg Tbec (Aspirin) .... Take One Tablet By Mouth Daily 11)  Venlafaxine Hcl 225 Mg Xr24h-Tab (Venlafaxine Hcl) .... Take 1 By Mouth Once Daily 12)  Lantus 100 Unit/ml Soln (Insulin Glargine) .... 24 Units At Bedtime 13)  Novolog 100 Unit/ml Soln (Insulin Aspart) .... As Directed 14)  Metoprolol Succinate 100 Mg Xr24h-Tab (Metoprolol Succinate) .... Take 1 By Mouth Once Daily 15)  Cialis 5 Mg Tabs (Tadalafil) .... Take 1 By Mouth Once Daily 16)  Gabapentin 300 Mg Caps (Gabapentin) .... Take 1 By Mouth At Bedtime 17)  Furosemide 40 Mg Tabs (Furosemide) .... Take One Tablet By Mouth Every Other Day 18)  Warfarin Sodium 5 Mg Tabs (Warfarin Sodium) .... Use As Directed By Anticoagulation Clinic 19)  Vitamin D (Ergocalciferol) 50000 Unit Caps (Ergocalciferol) .Marland Kitchen.. 1 Tab By Mouth Weekly  Allergies (verified): No Known Drug Allergies  Past History:  Past Medical History: 1. Coronary artery disease status post coronary artery bypass grafting surgery in 1995. 2. Chronic kidney disease stage III. 3. Chronic anemia secondary to underlying kidney disease. 4. Type 2 diabetes mellitus. 5. History of nephrolithiasis. 6. Essential hypertension. 7. Peripheral neuropathy. 8. Post-traumatic stress disorder. Takes prazosin for this.  9. Hernia repair. 10. Rotator cuff repair. 11. Atrial fibrillation/atrial flutter with ablation of flutter 2/10. Holter monitor 9/10 showed predominantly NSR with some short runs of atrial fibrillation.  12. Diastolic CHF: Last echo was in 9/10 showing EF 50-55%, mild LVH, grade I diastolic dysfunction, mild MR.  13. OSA: Unable to tolerate CPAP because of allergic reaction to straps of face mask.  14. Carotid dopplers (10/10): bilateral ICAs did not show significant disease. The right vertebral may have been occluded.    Family History: Reviewed history from 08/12/2009 and no changes required. Noncontributory.   Social History: Reviewed  history from 05/30/2009 and no changes required. Disabled  Tobacco Use - Former.  Alcohol Use - no  Review of Systems       All systems reviewed and negative except as per HPI.   Vital Signs:  Patient profile:   65 year old male Weight:      214.50 pounds Pulse rate:   93 / minute Pulse (ortho):   96 / minute Pulse rhythm:   regular BP sitting:   158 / 70  (left arm) BP standing:   146 / 80 Cuff size:   regular  Vitals Entered By: Charlena Cross, RN, BSN (January 21, 2010 3:53 PM)  Serial Vital Signs/Assessments:  Time      Position  BP       Pulse  Resp  Temp     By 4:50      Lying LA  151/92   86                    Melissa Howdeshell, RN, BSN 4:51      Sitting   144/78   93                    Melissa Howdeshell, RN, BSN 4:52      Standing  146/80   96                    Melissa Howdeshell, RN, BSN 4:54      Standing  147/94   94                    Melissa Howdeshell, RN, BSN 4:57      Standing  154/97   96                    Melissa Howdeshell, RN, BSN   Physical Exam  General:  Chronically ill-appearing male in no apparent distress.  Neck:  Neck supple, JVP 7 cm. No masses, thyromegaly or abnormal cervical nodes. Lungs:  Clear bilaterally to auscultation and percussion. Heart:  Non-displaced PMI, chest non-tender; regular rate and rhythm, S1, S2 without rubs or gallops. 2/6 crescendo-descrescendo murmur at RUSB.  Carotid upstroke normal, right carotid bruit. Pedals normal pulses. No edema.  Abdomen:  Bowel sounds positive; abdomen soft and non-tender without masses, organomegaly, or hernias noted. No hepatosplenomegaly. Extremities:  No clubbing or cyanosis. Neurologic:  Alert and oriented x 3. Psych:  depressed affect.     Impression & Recommendations:  Problem # 1:  ATRIAL FIBRILLATION (ICD-427.31) Patient has h/o atrial fibrillation.  On holter, he was noted to have short bursts of paroxysmal atrial fibrillation.  He is on Toprol XL to control his rate.  He  is at increased risk of stroke with CHADS2 score 2 (CHF, HTN).  I do worry about his fall risk.  We talked extensively about coumadin. He has had mechanical falls without injury two times in the last year.  He is careful when standing from a seated position and is using a cane.  I am aiming for a lower goal INR (2-2.5).  I do not think he would be a good candidate for Pradaxa given his CKD (last creatinine 1.9).   Problem # 2:  DIZZINESS (ICD-780.4) Patient has orthostatic-type symptoms.  He is unstable on his feet but uses a cane and moves slowly, allowing him to avoid falls most of the time.  I have not been able to document clear orthostasis by BP or pulse.  Orthostatic BP and pulse check was negative today though he felt lightheaded when he stood up. I will have him wear compression stockings.  He is on prazosin for PTSD.  This could certainly cause orthostasis.  However, patient states that prazosin is the only thing that has helped his nightmares.  [facility].  I asked him to talk to the psychiatrist about any possible alternatives prazosin use.   Problem # 3:  DIASTOLIC HEART FAILURE, CHRONIC (ICD-428.32) Patient does not appear particularly volume overloaded on exam.  Continue current lasix dosing.   Problem # 4:  CAD, ARTERY BYPASS GRAFT (ICD-414.04) Continue ASA, ACEI, beta blocker, and statin.  Needs lipids/LFTs.  Goal LDL <   Will check BMET as well.   Patient Instructions: 1)  Your physician recommends that you schedule a follow-up appointment in: 4 months 2)  Your physician recommends that you return for a FASTING lipid profile: at your earliest convenience (Lipids. cmet. bnp) 3)  Your physician recommends that you continue on your current medications as directed. Please refer to the Current Medication list given to you today. 4)  Please wear compression stockings during the day.

## 2010-12-31 NOTE — Letter (Signed)
Summary: [facility]  [facility]   Imported By: Harlon Flor 08/12/2010 15:31:41  _____________________________________________________________________  External Attachment:    Type:   Image     Comment:   External Document

## 2010-12-31 NOTE — Assessment & Plan Note (Signed)
Summary: f69m   Visit Type:  Follow-up Primary Provider:  Julieanne Manson, MD  CC:  Has been falling a lot and has cellulitis in legs & feet.  Denies chest pain or shortness of breath..  History of Present Illness: 65 yo with history of CAD s/p CABG 1995, atrial fib/flutter s/p atrial flutter ablation 2/10, HTN, and diastolic CHF returns for followup. Patient has been falling more recently and has been less stable.  He had a mechanical fall recently with plan films showing a C2 fracture (? old versus acute).  His coumadin has been stopped.  He continues to have significant gait difficulty with decreased sensation in his feet due to peripheral neuropathy.  He is now walking with a walker.  He has had increased lower extremity edema recently and has had left lower leg cellulitis recently treated with antibiotics.  He denies exertional dyspnea but is not doing much.  No orthopnea.  No chest pain.   Labs (9/10): K 4.7, creatinine 1.93.  Labs (10/10): HDL 38, LDL 86 Labs (3/11): K 4.2, creatinine 1.7, BNP 7   Current Medications (verified): 1)  Prazosin Hcl 5 Mg Caps (Prazosin Hcl) .... One Tab At Noon/ Two Tabs At Bedtime 2)  Omeprazole 20 Mg Tbec (Omeprazole) .... One Tab By Mouth Once Daily 3)  Niacin Cr 1000 Mg Cr-Tabs (Niacin) .... One Tab By Mouth Once Daily 4)  Methadone Hcl 10 Mg Tabs (Methadone Hcl) .... One Tab 10mg  Three Times A Day 5)  Lisinopril 10 Mg Tabs (Lisinopril) .... Take One Tablet By Mouth Daily 6)  Clonazepam 1 Mg Tabs (Clonazepam) .... One Tab Once Daily At [facility] and  Two Tabs At Bedtime 7)  Bupropion Hcl 150 Mg Xr24h-Tab (Bupropion Hcl) .... One Tab By Mouth Two Times A Day 8)  Docusate Sodium 100 Mg Caps (Docusate Sodium) .... 2 Tabs By Mouth Once Daily 9)  Crestor 20 Mg Tabs (Rosuvastatin Calcium) .... Take One Tablet By Mouth Daily. 10)  Aspirin 81 Mg Tbec (Aspirin) .... Take One Tablet By Mouth Daily 11)  Venlafaxine Hcl 225 Mg Xr24h-Tab (Venlafaxine Hcl) .... Take 1  By Mouth Once Daily 12)  Lantus 100 Unit/ml Soln (Insulin Glargine) .... 24 Units At Bedtime 13)  Novolog 100 Unit/ml Soln (Insulin Aspart) .... As Directed 14)  Metoprolol Succinate 100 Mg Xr24h-Tab (Metoprolol Succinate) .... Take 1 By Mouth Once Daily 15)  Cialis 5 Mg Tabs (Tadalafil) .... Take 1 By Mouth Once Daily 16)  Gabapentin 300 Mg Caps (Gabapentin) .... Take 1 By Mouth At Bedtime 17)  Furosemide 40 Mg Tabs (Furosemide) .... Take One Tablet By Mouth Every Other Day 18)  Vitamin D (Ergocalciferol) 50000 Unit Caps (Ergocalciferol) .Marland Kitchen.. 1 Tab By Mouth Weekly 19)  Testosterone Injection 400 Mg .... Every 2 Weeks 20)  Protopic 0.03 % Oint (Tacrolimus) .... Apply To Face and Neck Three Times A Week 21)  Ketoconazole 2 % Sham (Ketoconazole) .... Use Daily For Head 22)  Eucerin Plus 2.5-10 % Crea (Emollient) .... Apply Daily or As Needed  Allergies (verified): No Known Drug Allergies  Past History:  Past Surgical History: Last updated: 05/30/2009 CABG Rotator Cuff Repair  Family History: Last updated: 08/12/2009 Noncontributory.   Social History: Last updated: 05/30/2009 Disabled  Tobacco Use - Former.  Alcohol Use - no  Risk Factors: Smoking Status: quit (05/30/2009)  Past Medical History: 1. Coronary artery disease status post coronary artery bypass grafting surgery in 1995. 2. Chronic kidney disease stage III. 3. Chronic anemia  secondary to underlying kidney disease. 4. Type 2 diabetes mellitus. 5. History of nephrolithiasis. 6. Essential hypertension. 7. 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  8. Post-traumatic stress disorder. Takes prazosin for this.  9. Hernia repair. 10. Rotator cuff repair. 11. Atrial fibrillation/atrial flutter with ablation of flutter 2/10. Holter monitor 9/10 showed predominantly NSR with some short runs of atrial fibrillation.  Coumadin stopped in 7/11 due to frequent falls.  12. Diastolic CHF: Last echo was in 9/10 showing EF  50-55%, mild LVH, grade I diastolic dysfunction, mild MR.  13. OSA: Unable to tolerate CPAP because of allergic reaction to straps of face mask.  14. Carotid dopplers (10/10): bilateral ICAs did not show significant disease. The right vertebral may have been occluded.    Family History: Reviewed history from 08/12/2009 and no changes required. Noncontributory.   Social History: Reviewed history from 05/30/2009 and no changes required. Disabled  Tobacco Use - Former.  Alcohol Use - no  Review of Systems       All systems reviewed and negative except as per HPI.   Vital Signs:  Patient profile:   65 year old male Height:      68 inches Weight:      204 pounds BMI:     31.13 Pulse rate:   71 / minute BP sitting:   128 / 65  (left arm) Cuff size:   large  Vitals Entered By: Bishop Dublin, CMA (June 22, 2010 1:39 PM)  Physical Exam  General:  Chronically ill-appearing male in no apparent distress.  Neck:  Has [place] cervical collar in place.  Lungs:  Clear bilaterally to auscultation and percussion. Heart:  Non-displaced PMI, chest non-tender; regular rate and rhythm, S1, S2 without rubs or gallops. 2/6 crescendo-descrescendo murmur at RUSB.  Carotid upstroke normal, right carotid bruit. 2+ edema to knees bilaterally, erythema (mild) of left lower leg.  Abdomen:  Bowel sounds positive; abdomen soft and non-tender without masses, organomegaly, or hernias noted. No hepatosplenomegaly. Extremities:  No clubbing or cyanosis. Neurologic:  Alert and oriented x 3. Psych:  Normal affect.   Impression & Recommendations:  Problem # 1:  ATRIAL FIBRILLATION (ICD-427.31) Patient has h/o atrial fibrillation.  On holter, he was noted to have short bursts of paroxysmal atrial fibrillation.  He is on Toprol XL to control his rate.  He is at increased risk of stroke with CHADS2 score 2 (CHF, HTN).  However, he is having more frequent falls (mechanical, seem to be due to gait difficulty from  peripheral neuropathy).  He is off coumadin because of the falls.  I will increase his aspirin to 325 mg daily.   Problem # 2:  DIASTOLIC HEART FAILURE, CHRONIC (ICD-428.32) Significantly increased lower extremity edema though unable to assess JVP due to cervical collar.  Will increase Lasix to 40 mg daily.  He needs to avoid sodium. I will have him followup for reassessment in about a month.   Problem # 3:  CAD, ARTERY BYPASS GRAFT (ICD-414.04) Stable, no ischemic symptoms.  Continue current meds including ASA, lisinopril, Toprol XL, Crestor.  Will need lipids/LFTs at next appointment, goal LDL < 70.   Problem # 4:  GAIT INSTABILITY This seems to be due to peripheral neuropathy.  He has decreased sensation in his feet.  I suspect this is due to diabetes.  I will check B12 and TSH.  He needs meticulous blood glucose control to help ameliorate this.  I will have him see a neurologist to see if there is  any further workup that should be done for his gait instability.   Problem # 5:  CELLULITIS He is going to see Dr. Sullivan Lone today for followup on this.  He still has significant erythema.   Other Orders: EKG w/ Interpretation (93000)  Patient Instructions: 1)  Your physician recommends that you schedule a follow-up appointment in: 4-6 weeks 2)  Your physician recommends that you return for a FASTING lipid profile: 3 weeks (B12/TSH/BMET/Lipid/LFT) 3)  Your physician has recommended you make the following change in your medication: Increase furosemide 40 daily 4)  You have been referred to [facility] Prescriptions: FUROSEMIDE 40 MG TABS (FUROSEMIDE) Take one tablet by mouth daily  #30 x 4   Entered by:   Benedict Needy, RN   Authorized by:   Marca Ancona, MD   Signed by:   Benedict Needy, RN on 06/22/2010   Method used:   Electronically to        [facility]* (retail)       [address]       [place], [place]  [address]       Ph: [phone]       Fax:  [phone]   RxID:   [id number]

## 2010-12-31 NOTE — Medication Information (Signed)
Summary: CCR/AMD  Anticoagulant Therapy  Managed by: Charlena Cross, RN, BSN Referring MD: Verne Carrow MD PCP: Julieanne Manson, MD Supervising MD: Mariah Milling Indication 1: Atrial Fibrillation (ICD-427.31)  Site: [place] INR POC 1.8 INR RANGE 2.0-2.5  Dietary changes: no    Health status changes: no    Bleeding/hemorrhagic complications: no    Recent/future hospitalizations: no    Any changes in medication regimen? no    Recent/future dental: no  Any missed doses?: no       Is patient compliant with meds? yes       Allergies: No Known Drug Allergies  Anticoagulation Management History:      The patient is taking warfarin and comes in today for a routine follow up visit.  Positive risk factors for bleeding include presence of serious comorbidities.  Negative risk factors for bleeding include an age less than 5 years old.  The bleeding index is 'intermediate risk'.  Positive CHADS2 values include History of CHF and History of HTN.  Negative CHADS2 values include Age > 66 years old.  The start date was 11/29/2008.  Anticoagulation responsible provider: [facility].  INR POC: 1.8.    Anticoagulation Management Assessment/Plan:      The patient's current anticoagulation dose is Warfarin sodium 5 mg tabs: Use as directed by Anticoagulation Clinic.  The target INR is 0 - 0.  The next INR is due 02/17/2010.  Results were reviewed/authorized by Charlena Cross, RN, BSN.  He was notified by Charlena Cross, RN, BSN.         Prior Anticoagulation Instructions: coumadin 7.5 mg today then resume dose of coumadin 5 mg daily with 7.5 mg on Tues.  Current Anticoagulation Instructions: coumadin 5 mg daily with 7.5 mg on Tues and Thurs

## 2010-12-31 NOTE — Letter (Signed)
Summary: Department of [facility]  Department of [facility]   Imported By: Harlon Flor 03/24/2010 15:05:07  _____________________________________________________________________  External Attachment:    Type:   Image     Comment:   External Document

## 2010-12-31 NOTE — Medication Information (Signed)
Summary: CCR  Anticoagulant Therapy  Managed by: Cloyde Reams, RN, BSN Referring MD: Verne Carrow MD PCP: Julieanne Manson, MD Supervising MD: Mariah Milling Indication 1: Atrial Fibrillation (ICD-427.31) [place] Site: [place] INR POC 2.2 INR RANGE 2.0-2.5    Bleeding/hemorrhagic complications: no     Any changes in medication regimen? no     Any missed doses?: yes     Details: missed 1 dosage a few weeks ago.    Is patient compliant with meds? yes       Allergies: No Known Drug Allergies  Anticoagulation Management History:      The patient is taking warfarin and comes in today for a routine follow up visit.  Positive risk factors for bleeding include an age of 24 years or older and presence of serious comorbidities.  The bleeding index is 'intermediate risk'.  Positive CHADS2 values include History of CHF and History of HTN.  Negative CHADS2 values include Age > 64 years old.  The start date was 11/29/2008.  Anticoagulation responsible provider: [facility].  INR POC: 2.2.  Cuvette Lot#: [id number].  Exp: 06/2011.    Anticoagulation Management Assessment/Plan:      The patient's current anticoagulation dose is Warfarin sodium 5 mg tabs: Use as directed by Anticoagulation Clinic.  The target INR is 0 - 0.  The next INR is due 05/19/2010.  Anticoagulation instructions were given to patient.  Results were reviewed/authorized by Cloyde Reams, RN, BSN.  He was notified by Cloyde Reams RN.         Prior Anticoagulation Instructions: INR 1.7  Take an extra 1/2 tablet today, then resume same dosage 1 tablet daily except 1.5 tablets on Tuesdays, Thursdays, and Saturdays.  Recheck in 2 weeks.    Current Anticoagulation Instructions: INR 2.2  Continue on same dosage 1 tablet daily except 1.5 tablets on Tuesdays, Thursdays, and Saturdays.  Recheck in 4 weeks.

## 2010-12-31 NOTE — Medication Information (Signed)
Summary: ccr  Anticoagulant Therapy  Managed by: Charlena Cross, RN, BSN Referring MD: Verne Carrow MD PCP: Julieanne Manson, MD Supervising MD: Mariah Milling Indication 1: Atrial Fibrillation (ICD-427.31) [place] Site: [place] INR POC 1.6 INR RANGE 2.0-2.5  Dietary changes: no    Health status changes: no    Bleeding/hemorrhagic complications: no    Recent/future hospitalizations: no    Any changes in medication regimen? yes       Details: on ABT- unsure which one tho  Recent/future dental: no  Any missed doses?: no       Is patient compliant with meds? yes       Allergies: No Known Drug Allergies  Anticoagulation Management History:      The patient is taking warfarin and comes in today for a routine follow up visit.  Positive risk factors for bleeding include presence of serious comorbidities.  Negative risk factors for bleeding include an age less than 38 years old.  The bleeding index is 'intermediate risk'.  Positive CHADS2 values include History of CHF and History of HTN.  Negative CHADS2 values include Age > 14 years old.  The start date was 11/29/2008.  Anticoagulation responsible provider: Donivan Thammavong.  INR POC: 1.6.    Anticoagulation Management Assessment/Plan:      The patient's current anticoagulation dose is Warfarin sodium 5 mg tabs: Use as directed by Anticoagulation Clinic.  The target INR is 0 - 0.  The next INR is due 03/04/2010.  Results were reviewed/authorized by Charlena Cross, RN, BSN.  He was notified by Charlena Cross, RN, BSN.         Prior Anticoagulation Instructions: coumadin 5 mg daily with 7.5 mg on Tues and Thurs  Current Anticoagulation Instructions: coumadin 7.5 mg today then coumadin 5 mg daily with 7.5 mg on Tues Thurs Sat

## 2010-12-31 NOTE — Assessment & Plan Note (Signed)
Summary: F3M/AMD   Visit Type:  Follow-up Primary Provider:  Julieanne Manson, MD  CC:  f/u 3 mths. c/o fatigue.Marland Kitchen  History of Present Illness: 65 yo with history of CAD s/p CABG 1995, atrial fib/flutter s/p atrial flutter ablation 2/10, HTN, gait instability/peripheral neuropathy, and diastolic CHF returns for followup.  He has continued to have periodic falls.  He saw neurology.  I reviewed the note, and it appears that they think his gait instability is primarily due to his peripheral neuropathy (probably from diabetes).  MRI of brain only showed some atrophy.  1 of the falls sounded like it was orthostatic in nature, however, and involved brief syncope.  Patient stood up too quickly and got very lightheaded and fell.  He thinks he may have briefly passed out.  He has been getting milder orthostatic-type symptoms.  He will get lightheaded if he stands too fast, but if he takes his time when standing up he tends to do ok.  His weight is down 7 lbs.  SBP at home has been running in the 110s-120s.  No chest pain, no significant exertional dyspnea, though he is not particularly active.   Labs (9/10): K 4.7, creatinine 1.93.  Labs (10/10): HDL 38, LDL 86 Labs (3/11): K 4.2, creatinine 1.7 Labs (8/11): K 4.3, creatinine 1.67, LDL 68, HDL 47 Labs (12/11): K 4.4, creatinine 1.4  ECG: NSR, normal   Current Medications (verified): 1)  Prazosin Hcl 5 Mg Caps (Prazosin Hcl) .... One Tab At Noon/ Two Tabs At Bedtime 2)  Omeprazole 20 Mg Tbec (Omeprazole) .... One Tab By Mouth Once Daily 3)  Niacin Cr 1000 Mg Cr-Tabs (Niacin) .... One Tab By Mouth Once Daily 4)  Methadone Hcl 10 Mg Tabs (Methadone Hcl) .... One Tab 10mg  Three Times A Day 5)  Lisinopril 10 Mg Tabs (Lisinopril) .... Take 1/2  Tablet By Mouth Daily 6)  Clonazepam 1 Mg Tabs (Clonazepam) .... One Tab Once Daily At [facility] and  Two Tabs At Bedtime 7)  Bupropion Hcl 150 Mg Xr24h-Tab (Bupropion Hcl) .... One Tab By Mouth Two Times A Day 8)   Docusate Sodium 100 Mg Caps (Docusate Sodium) .... 2 Tabs By Mouth Once Daily 9)  Crestor 20 Mg Tabs (Rosuvastatin Calcium) .... Take One Tablet By Mouth Daily. 10)  Aspirin 325 Mg Tabs (Aspirin) .... One Tablet Once Daily 11)  Venlafaxine Hcl 225 Mg Xr24h-Tab (Venlafaxine Hcl) .... Take 1 By Mouth Once Daily 12)  Lantus 100 Unit/ml Soln (Insulin Glargine) .... 24 Units At Bedtime 13)  Novolog 100 Unit/ml Soln (Insulin Aspart) .... As Directed 14)  Metoprolol Succinate 100 Mg Xr24h-Tab (Metoprolol Succinate) .... Take 1 By Mouth Once Daily 15)  Cialis 5 Mg Tabs (Tadalafil) .... Take 1 By Mouth Once Daily 16)  Gabapentin 300 Mg Caps (Gabapentin) .... Take 1 By Mouth At Bedtime 17)  Furosemide 40 Mg Tabs (Furosemide) .... Take One Tablet By Mouth Daily 18)  Vitamin D (Ergocalciferol) 50000 Unit Caps (Ergocalciferol) .Marland Kitchen.. 1 Tab By Mouth Weekly 19)  Testosterone Injection 400 Mg .... Every 2 Weeks 20)  Protopic 0.03 % Oint (Tacrolimus) .... Apply To Face and Neck Three Times A Week 21)  Ketoconazole 2 % Sham (Ketoconazole) .... Use Daily For Head 22)  Eucerin Plus 2.5-10 % Crea (Emollient) .... Apply Daily or As Needed 23)  Metoprolol Tartrate 100 Mg Tabs (Metoprolol Tartrate) .Marland Kitchen.. 1  Tablet Two Times A Day  Allergies (verified): No Known Drug Allergies  Past History:  Past  Surgical History: Last updated: 05/30/2009 CABG Rotator Cuff Repair  Family History: Last updated: 08/12/2009 Noncontributory.   Social History: Last updated: 11/09/2010 Disabled, lives in [place] Tobacco Use - Former.  Alcohol Use - no  Risk Factors: Smoking Status: quit (05/30/2009)  Past Medical History: 1. Coronary artery disease status post coronary artery bypass grafting surgery in 1995. 2. Chronic kidney disease stage III. 3. Chronic anemia secondary to underlying kidney disease. 4. Type 2 diabetes mellitus. 5. History of nephrolithiasis. 6. Essential hypertension. 7. Peripheral neuropathy,  suspect diabetes-related.  Has led to gait instability.  Seen by [facility] Neurology, head MRI showed only mild diffuse atrophy with moderate peri-sylvan atrophy.  8. Post-traumatic stress disorder. Takes prazosin for this.  9. Hernia repair. 10. Rotator cuff repair. 11. Atrial fibrillation/atrial flutter with ablation of flutter 2/10. Holter monitor 9/10 showed predominantly NSR with some short runs of atrial fibrillation.  Coumadin stopped in 7/11 due to frequent falls.  12. Diastolic CHF: Last echo was in 9/10 showing EF 50-55%, mild LVH, grade I diastolic dysfunction, mild MR.  13. OSA: Unable to tolerate CPAP because of allergic reaction to straps of face mask.  14. Carotid dopplers (10/10): bilateral ICAs did not show significant disease. The right vertebral may have been occluded.  Has right carotid bruit.   Family History: Reviewed history from 08/12/2009 and no changes required. Noncontributory.   Social History: Disabled, lives in [place] Tobacco Use - Former.  Alcohol Use - no  Review of Systems       All systems reviewed and negative except as per HPI.   Vital Signs:  Patient profile:   65 year old male Height:      68 inches Weight:      183.50 pounds BMI:     28.00 Pulse rate:   92 / minute BP sitting:   94 / 58  (left arm) Cuff size:   regular  Vitals Entered By: Lysbeth Galas CMA (November 09, 2010 3:54 PM)  Physical Exam  General:  Well developed, well nourished, in no acute distress.  Somewhat pale-appearing Neck:  Neck supple, no JVD. No masses, thyromegaly or abnormal cervical nodes. Lungs:  Clear bilaterally to auscultation and percussion. Heart:  Non-displaced PMI, chest non-tender; regular rate and rhythm, S1, S2 without rubs or gallops. 2/6 crescendo-descrescendo murmur at RUSB.  Carotid upstroke normal, right carotid bruit. No ankle edema.  Abdomen:  Bowel sounds positive; abdomen soft and non-tender without masses, organomegaly, or hernias noted. No  hepatosplenomegaly. Extremities:  No clubbing or cyanosis. Neurologic:  Alert and oriented x 3. Psych:  Normal affect.   Impression & Recommendations:  Problem # 1:  DIZZINESS (ICD-780.4) Part of patient's gait instability is certainly due to his peripheral neuropathy (likely from diabetes).  However, there also seems to be an orthostatic component. He is having to be very careful when he stands up to avoid severe lightheadedness.  He cannot stop his prazosin due to risk of PTSD deterioration though this likely contributes to orthostasis.  As blood pressure is low today and he has orthostatic symptoms,  I am going to have him stop his lisinopril for now and change his beta blocker to Toprol XL 75 mg daily.   Problem # 2:  ATRIAL FIBRILLATION (ICD-427.31) Patient has h/o atrial fibrillation.  On holter, he was noted to have short bursts of paroxysmal atrial fibrillation.  He is on metoprolol to control his rate.  He is at increased risk of stroke with CHADS2 score 2 (CHF,  HTN).  However, he is having frequent falls (mechanical, seem to be due to gait difficulty from peripheral neuropathy as well as orthostatic hypotension).  He is off coumadin because of the falls. He is taking ASA 325 mg daily.   Problem # 3:  DIASTOLIC HEART FAILURE, CHRONIC (ICD-428.32) Edema has resolved with Lasix and breathing is ok.  Continue current Lasix dose.    Problem # 4:  ESSENTIAL HYPERTENSION, BENIGN (ICD-401.1) BP running on the low side now.  Med changes as above.   Problem # 5:  CAD, ARTERY BYPASS GRAFT (ICD-414.04) Stable, no ischemic symptoms.  Continue ASA, metoprolol, and Crestor.  Last lipids were at goal (LDL < 70).   Patient is very pale-appearing today.  I will get a CBC to make sure that he is not significantly anemic.   Other Orders: T-CBC w/Diff [phone])  Patient Instructions: 1)  Your physician recommends that you schedule a follow-up appointment in: 3 months 2)  Your physician has  recommended you make the following change in your medication: Stop Lisinopril, Stop Metoprolol Tartrate.  Start Metoprolol Succinate 75mg  daily. Prescriptions: METOPROLOL SUCCINATE 25 MG XR24H-TAB (METOPROLOL SUCCINATE) Take 3 tablets once daily  #90 x 6   Entered by:   Cloyde Reams RN   Authorized by:   Marca Ancona, MD   Signed by:   Cloyde Reams RN on 11/09/2010   Method used:   Electronically to        [facility]* (retail)       [address]       [place], [place]  [address]       Ph: [phone]       Fax: [phone]   RxID:   [id number]

## 2010-12-31 NOTE — Assessment & Plan Note (Signed)
Summary: F/U 1 MONTH   Visit Type:  Follow-up Primary Provider:  Julieanne Manson, MD  CC:  "Doing well"..  History of Present Illness: 65 yo with history of CAD s/p CABG 1995, atrial fib/flutter s/p atrial flutter ablation 2/10, HTN, gait instability/peripheral neuropathy, and diastolic CHF returns for followup.  He had a mechanical fall recently with plan films showing a C2 fracture (? old versus acute).  He is still in a cervical collar today.  His coumadin has been stopped.  He continues to have significant gait difficulty with decreased sensation in his feet due to peripheral neuropathy (fell again last week).  He is now walking with a walker when outside the house.  Since starting Lasix, pain in his feet and the swelling have both almost disappeared.  He does report some lightheadedness with standing.  No chest pain.  No dyspnea walkling in his house.   Labs (9/10): K 4.7, creatinine 1.93.  Labs (10/10): HDL 38, LDL 86 Labs (3/11): K 4.2, creatinine 1.7 Labs (8/11): K 4.3, creatinine 1.67, LDL 68, HDL 47   Current Medications (verified): 1)  Prazosin Hcl 5 Mg Caps (Prazosin Hcl) .... One Tab At Noon/ Two Tabs At Bedtime 2)  Omeprazole 20 Mg Tbec (Omeprazole) .... One Tab By Mouth Once Daily 3)  Niacin Cr 1000 Mg Cr-Tabs (Niacin) .... One Tab By Mouth Once Daily 4)  Methadone Hcl 10 Mg Tabs (Methadone Hcl) .... One Tab 10mg  Three Times A Day 5)  Lisinopril 10 Mg Tabs (Lisinopril) .... Take One Tablet By Mouth Daily 6)  Clonazepam 1 Mg Tabs (Clonazepam) .... One Tab Once Daily At [facility] and  Two Tabs At Bedtime 7)  Bupropion Hcl 150 Mg Xr24h-Tab (Bupropion Hcl) .... One Tab By Mouth Two Times A Day 8)  Docusate Sodium 100 Mg Caps (Docusate Sodium) .... 2 Tabs By Mouth Once Daily 9)  Crestor 20 Mg Tabs (Rosuvastatin Calcium) .... Take One Tablet By Mouth Daily. 10)  Aspirin 325 Mg Tabs (Aspirin) .... One Tablet Once Daily 11)  Venlafaxine Hcl 225 Mg Xr24h-Tab (Venlafaxine Hcl) .... Take 1  By Mouth Once Daily 12)  Lantus 100 Unit/ml Soln (Insulin Glargine) .... 24 Units At Bedtime 13)  Novolog 100 Unit/ml Soln (Insulin Aspart) .... As Directed 14)  Metoprolol Succinate 100 Mg Xr24h-Tab (Metoprolol Succinate) .... Take 1 By Mouth Once Daily 15)  Cialis 5 Mg Tabs (Tadalafil) .... Take 1 By Mouth Once Daily 16)  Gabapentin 300 Mg Caps (Gabapentin) .... Take 1 By Mouth At Bedtime 17)  Furosemide 40 Mg Tabs (Furosemide) .... Take One Tablet By Mouth Daily 18)  Vitamin D (Ergocalciferol) 50000 Unit Caps (Ergocalciferol) .Marland Kitchen.. 1 Tab By Mouth Weekly 19)  Testosterone Injection 400 Mg .... Every 2 Weeks 20)  Protopic 0.03 % Oint (Tacrolimus) .... Apply To Face and Neck Three Times A Week 21)  Ketoconazole 2 % Sham (Ketoconazole) .... Use Daily For Head 22)  Eucerin Plus 2.5-10 % Crea (Emollient) .... Apply Daily or As Needed  Allergies (verified): No Known Drug Allergies  Past History:  Past Surgical History: Last updated: 05/30/2009 CABG Rotator Cuff Repair  Family History: Last updated: 08/12/2009 Noncontributory.   Social History: Last updated: 05/30/2009 Disabled  Tobacco Use - Former.  Alcohol Use - no  Risk Factors: Smoking Status: quit (05/30/2009)  Past Medical History: Reviewed history from 06/22/2010 and no changes required. 1. Coronary artery disease status post coronary artery bypass grafting surgery in 1995. 2. Chronic kidney disease stage III. 3.  Chronic anemia secondary to underlying kidney disease. 4. Type 2 diabetes mellitus. 5. History of nephrolithiasis. 6. Essential hypertension. 7. 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  8. Post-traumatic stress disorder. Takes prazosin for this.  9. Hernia repair. 10. Rotator cuff repair. 11. Atrial fibrillation/atrial flutter with ablation of flutter 2/10. Holter monitor 9/10 showed predominantly NSR with some short runs of atrial fibrillation.  Coumadin stopped in 7/11 due to frequent falls.  12.  Diastolic CHF: Last echo was in 9/10 showing EF 50-55%, mild LVH, grade I diastolic dysfunction, mild MR.  13. OSA: Unable to tolerate CPAP because of allergic reaction to straps of face mask.  14. Carotid dopplers (10/10): bilateral ICAs did not show significant disease. The right vertebral may have been occluded.    Family History: Reviewed history from 08/12/2009 and no changes required. Noncontributory.   Social History: Reviewed history from 05/30/2009 and no changes required. Disabled  Tobacco Use - Former.  Alcohol Use - no  Review of Systems       All systems reviewed and negative except as per HPI.   Vital Signs:  Patient profile:   64 year old male Height:      68 inches Weight:      190 pounds BMI:     28.99 Pulse rate:   74 / minute BP sitting:   112 / 71  (left arm) Cuff size:   large  Vitals Entered By: Bishop Dublin, CMA (July 28, 2010 2:02 PM)  Physical Exam  General:  Chronically ill-appearing male in no apparent distress.  Neck:  Neck supple, no JVD. No masses, thyromegaly or abnormal cervical nodes. Lungs:  Clear bilaterally to auscultation and percussion. Heart:  Non-displaced PMI, chest non-tender; regular rate and rhythm, S1, S2 without rubs or gallops. 2/6 crescendo-descrescendo murmur at RUSB.  Carotid upstroke normal, right carotid bruit. Trace ankle edema.  Abdomen:  Bowel sounds positive; abdomen soft and non-tender without masses, organomegaly, or hernias noted. No hepatosplenomegaly. Extremities:  No clubbing or cyanosis. Neurologic:  Alert and oriented x 3. Psych:  Normal affect.   Impression & Recommendations:  Problem # 1:  UNSTEADY GAIT (ICD-781.2) This seems to be due to peripheral neuropathy.  He has decreased sensation in his feet.  I suspect this is due to diabetes.  He needs meticulous blood glucose control to help ameliorate this.  I will have him see a neurologist to see if there is any further workup that should be done for his  gait instability.   Problem # 2:  ATRIAL FIBRILLATION (ICD-427.31) \Patient has h/o atrial fibrillation.  On holter, he was noted to have short bursts of paroxysmal atrial fibrillation.  He is on metoprolol to control his rate.  He is at increased risk of stroke with CHADS2 score 2 (CHF, HTN).  However, he is having more frequent falls (mechanical, seem to be due to gait difficulty from peripheral neuropathy).  He is off coumadin because of the falls. He is taking ASA 325 mg daily.   Problem # 3:  DIASTOLIC HEART FAILURE, CHRONIC (ICD-428.32) Edema has resolved with Lasix and breathing is ok.  Continue current Lasix dose.    Problem # 4:  CAD, ARTERY BYPASS GRAFT (ICD-414.04) Stable, no ischemic symptoms.  Continue current meds including ASA, lisinopril,  metoprolol, Crestor.  Last lipids were at goal (LDL < 70).   Problem # 5:  DIZZINESS (ICD-780.4) Patient does have orthostatic-type symptoms upon standing up from a seated position.  I will have him cut lisinopril  back to 5 mg daily and cut Lopressor to 100 mg two times a day.  He will check his BP daily an we will call in 2 wks to see what it is running.   Other Orders: EKG w/ Interpretation (93000)  Patient Instructions: 1)  Your physician recommends that you schedule a follow-up appointment in: 3 months  2)  Your physician recommends that you return for lab work in: before your next appointment (BMP) 3)  Your physician has recommended you make the following change in your medication: DECREASE lisinopril 1/2 tab daily, metoprolol 1 tab two times a day

## 2010-12-31 NOTE — Progress Notes (Signed)
Summary: Falls  Phone Note Call from Patient   Caller: Patient Summary of Call: The pt called today stating he was just going to lay down for a nap a few minutes ago. He was wearing some socks from the hospital and he slipped on the floor in his bedroom. He hit the back of his head on a heating unit. He was calling us asking what he should do. He denies headaches, blurred vision, or bleeding where he hit his head. He states he does have a history of falls, but he feels like he is falling more frequently. He states he feels like he falls every other day. He is not passing out, but he states he is unsteady on his feet. I asked if he has a walker. He states he does, but he trips over it. It sounds as though he does not have a lot of carpet in his home. He states he is probably going to replace his current floors with carpet. I explained he should wear shoes that have rubber soles in order to give him some grip. I have also advised the pt to report to the ER for evaluation and for possible head CT. He is unsure if he will do this. I have advised the pt that is our recommendation. I will also leave this message for Dr. Shirlee Latch so he is aware of the pt's ongoing problems with his falls. The pt is agreeable. I will call him back with any recommendations from Dr. Shirlee Latch regarding coumadin. Initial call taken by: Sherri Rad, RN, BSN,  June 10, 2010 3:26 PM  Follow-up for Phone Call        The pt called back and stated he is going to the ER at [facility] for evaluation after thinking about our conversation. Follow-up by: Sherri Rad, RN, BSN,  June 10, 2010 5:07 PM     Appended Document: Falls Mr. Baugher needs to stop coumadin due to falls.  He needs to followup soon in the office. He needs to see a neurologist regarding lack of balance (if he has not done so already).   Appended Document: Falls Patient was admitted yesterday at [facility] due to fall with a C2 fracture, hospitalist stopped coumadin Dr. Mariah Milling  was consulted later and will be discharged off coumadin.  He is awaiting a neck brace that was ordered and will be discharged tomorrow and will follow up with neurologist Dr. Phoebe Perch.  Appended Document: Falls I left a messge on the pt's voice mail that Dr. Shirlee Latch is agreeable with him stopping the coumadin. He is scheduled to f/u with Dr. Shirlee Latch on 06/22/10.

## 2010-12-31 NOTE — Letter (Signed)
Summary: ER Notification  Architectural technologist at [address]   [place], [place] [address]   Phone: [phone]  Fax: [phone]    June 10, 2010 5:07 PM  Michael Lucas  The above referenced patient has been advised to report directly to the Emergency Room. Please see below for more information:  Dx: Fall- on coumadin     Private Vehicle  _____x_____ or EMS:  ________________   Orders:  Yes ______ or No  ___x____   Notify upon arrival:  ER MD         Thank you,    [facility] Staff  Appended Document: ER Notification Faxed to the [facility] ER at [phone].

## 2010-12-31 NOTE — Progress Notes (Signed)
Summary: question about referral  Phone Note Call from Patient   Summary of Call: Referred to [facility] by Dr. Shirlee Latch.  Pt wants to know why there instead of [facility].  Please call and explain Initial call taken by: Park Breed,  July 01, 2010 2:38 PM  Follow-up for Phone Call        pt's questions answered  Follow-up by: Benedict Needy, RN,  July 01, 2010 3:01 PM

## 2010-12-31 NOTE — Miscellaneous (Signed)
Summary: Neuro Consult  Clinical Lists Changes  Problems: Added new problem of UNSTEADY GAIT (ICD-781.2) Orders: Added new Referral order of Neurology Referral (Neuro) - Signed

## 2010-12-31 NOTE — Medication Information (Signed)
Summary: Coumadin Clinic  Anticoagulant Therapy  Managed by: Inactive Referring MD: Verne Carrow MD PCP: Julieanne Manson, MD Supervising MD: Graciela Husbands MD, Viviann Spare Indication 1: Atrial Fibrillation (ICD-427.31) [place] Site: [place] INR RANGE 2.0-2.5          Comments: Per Dr Alford Highland office note from 06/22/10 pt is off Coumadin d/t frequent falls and incr fall risk. Michael Reams RN  July 08, 2010 3:43 PM   Allergies: No Known Drug Allergies  Anticoagulation Management History:      Positive risk factors for bleeding include an age of 66 years or older and presence of serious comorbidities.  The bleeding index is 'intermediate risk'.  Positive CHADS2 values include History of CHF and History of HTN.  Negative CHADS2 values include Age > 58 years old.  The start date was 11/29/2008.  Anticoagulation responsible Michael Lucas: Graciela Husbands MD, Viviann Spare.  Exp: 07/2011.    Anticoagulation Management Assessment/Plan:      The target INR is 0 - 0.  The next INR is due 06/16/2010.  Anticoagulation instructions were given to patient.  Results were reviewed/authorized by Inactive.         Prior Anticoagulation Instructions: INR 2.3  Continue on same dosage 1 tablet daily except 1.5 tablets on Tuesdays, Thursdays, and Saturdays.  Recheck in 4 weeks.

## 2010-12-31 NOTE — Medication Information (Signed)
Summary: CCR/AMD  Anticoagulant Therapy  Managed by: Charlena Cross, RN, BSN Referring MD: Verne Carrow MD PCP: Julieanne Manson, MD Supervising MD: Mariah Milling Indication 1: Atrial Fibrillation (ICD-427.31) [place] Site: [place] INR POC 2.2 INR RANGE 2.0-2.5  Dietary changes: no    Health status changes: no    Bleeding/hemorrhagic complications: no    Recent/future hospitalizations: no    Any changes in medication regimen? no    Recent/future dental: no  Any missed doses?: yes     Details: missed a dose last week  Is patient compliant with meds? yes       Allergies: No Known Drug Allergies  Anticoagulation Management History:      The patient is taking warfarin and comes in today for a routine follow up visit.  Positive risk factors for bleeding include presence of serious comorbidities.  Negative risk factors for bleeding include an age less than 81 years old.  The bleeding index is 'intermediate risk'.  Positive CHADS2 values include History of CHF and History of HTN.  Negative CHADS2 values include Age > 52 years old.  The start date was 11/29/2008.  Anticoagulation responsible provider: [place].  INR POC: 2.2.    Anticoagulation Management Assessment/Plan:      The patient's current anticoagulation dose is Warfarin sodium 5 mg tabs: Use as directed by Anticoagulation Clinic.  The target INR is 0 - 0.  The next INR is due 04/01/2010.  Anticoagulation instructions were given to patient.  Results were reviewed/authorized by Charlena Cross, RN, BSN.  He was notified by Charlena Cross, RN, BSN.         Prior Anticoagulation Instructions: coumadin 7.5 mg today then coumadin 5 mg daily with 7.5 mg on Tues Thurs Sat  Current Anticoagulation Instructions: The patient is to continue with the same dose of coumadin.  This dosage includes: coumadin 5 mg daily with 7.5 mg on Tues and Thurs and Sat.  Appended Document: CCR/AMD    Clinical Lists  Changes  Medications: Added new medication of * TESTOSTERONE INJECTION 400 MG every 2 weeks Added new medication of PROTOPIC 0.03 % OINT (TACROLIMUS) apply to face and neck three times a week Added new medication of KETOCONAZOLE 2 % SHAM (KETOCONAZOLE) use daily for head Added new medication of EUCERIN PLUS 2.5-10 % CREA (EMOLLIENT) apply daily or as needed

## 2010-12-31 NOTE — Progress Notes (Signed)
Summary: Called pt  Phone Note Outgoing Call Call back at [facility] Phone [phone]   Call placed by: Harlon Flor,  December 15, 2010 9:05 AM Call placed to: Patient Summary of Call: [facility] TCB to change the time of the appt scheduled for 3/14 w/ [place]. Initial call taken by: Harlon Flor,  December 15, 2010 9:06 AM

## 2010-12-31 NOTE — Medication Information (Signed)
Summary: CCR/AMD  Anticoagulant Therapy  Managed by: Cloyde Reams, RN, BSN Referring MD: Verne Carrow MD PCP: Julieanne Manson, MD Supervising MD: Mariah Milling Indication 1: Atrial Fibrillation (ICD-427.31) [place] Site: [place] INR POC 1.7 INR RANGE 2.0-2.5    Bleeding/hemorrhagic complications: no     Any changes in medication regimen? no     Any missed doses?: yes     Details: May have missed 1 dosage on Monday.  Is patient compliant with meds? yes       Allergies: No Known Drug Allergies  Anticoagulation Management History:      The patient is taking warfarin and comes in today for a routine follow up visit.  Positive risk factors for bleeding include presence of serious comorbidities.  Negative risk factors for bleeding include an age less than 36 years old.  The bleeding index is 'intermediate risk'.  Positive CHADS2 values include History of CHF and History of HTN.  Negative CHADS2 values include Age > 28 years old.  The start date was 11/29/2008.  Anticoagulation responsible provider: Kameran Lallier.  INR POC: 1.7.  Cuvette Lot#: [id number].  Exp: 03/2011.    Anticoagulation Management Assessment/Plan:      The patient's current anticoagulation dose is Warfarin sodium 5 mg tabs: Use as directed by Anticoagulation Clinic.  The target INR is 0 - 0.  The next INR is due 04/15/2010.  Anticoagulation instructions were given to patient.  Results were reviewed/authorized by Cloyde Reams, RN, BSN.  He was notified by Cloyde Reams RN.         Prior Anticoagulation Instructions: The patient is to continue with the same dose of coumadin.  This dosage includes: coumadin 5 mg daily with 7.5 mg on Tues and Thurs and Sat.  Current Anticoagulation Instructions: INR 1.7  Take an extra 1/2 tablet today, then resume same dosage 1 tablet daily except 1.5 tablets on Tuesdays, Thursdays, and Saturdays.  Recheck in 2 weeks.   Prescriptions: WARFARIN SODIUM 5 MG TABS (WARFARIN SODIUM) Use as  directed by Anticoagulation Clinic  #45 x 3   Entered by:   Cloyde Reams RN   Authorized by:   Dossie Arbour MD   Signed by:   Cloyde Reams RN on 04/01/2010   Method used:   Electronically to        [facility]* (retail)       [address]       [place], [place]  [address]       Ph: [phone]       Fax: [phone]   RxID:   [id number]

## 2010-12-31 NOTE — Progress Notes (Signed)
Summary: [facility]  Phone Note Outgoing Call   Call placed by: Benedict Needy, RN,  July 01, 2010 9:46 AM Call placed to: Patient Summary of Call: Pt has appointment with Dr. Yaakov Guthrie at [facility] 08/04/10 at 11:30. No answer after 10+rings  Initial call taken by: Benedict Needy, RN,  July 01, 2010 9:47 AM  Follow-up for Phone Call        pt aware of appointment. Follow-up by: Benedict Needy, RN,  July 01, 2010 11:48 AM

## 2010-12-31 NOTE — Medication Information (Signed)
Summary: CCR/AMD  Anticoagulant Therapy  Managed by: Cloyde Reams, RN, BSN Referring MD: Verne Carrow MD PCP: Julieanne Manson, MD Supervising MD: Graciela Husbands MD, Viviann Spare Indication 1: Atrial Fibrillation (ICD-427.31)  Site: [place] INR POC 2.3 INR RANGE 2.0-2.5  Dietary changes: no    Health status changes: no    Bleeding/hemorrhagic complications: yes    Recent/future hospitalizations: no    Any changes in medication regimen? no    Recent/future dental: yes     Details: Dental extraction on 05/14/10, some bleeding secondary to.    Any missed doses?: yes     Details: May have missed 1 dosage  Is patient compliant with meds? yes       Allergies: No Known Drug Allergies  Anticoagulation Management History:      The patient is taking warfarin and comes in today for a routine follow up visit.  Positive risk factors for bleeding include an age of 66 years or older and presence of serious comorbidities.  The bleeding index is 'intermediate risk'.  Positive CHADS2 values include History of CHF and History of HTN.  Negative CHADS2 values include Age > 66 years old.  The start date was 11/29/2008.  Anticoagulation responsible provider: Graciela Husbands MD, Viviann Spare.  INR POC: 2.3.  Cuvette Lot#: [id number].  Exp: 07/2011.    Anticoagulation Management Assessment/Plan:      The patient's current anticoagulation dose is Warfarin sodium 5 mg tabs: Use as directed by Anticoagulation Clinic.  The target INR is 0 - 0.  The next INR is due 06/16/2010.  Anticoagulation instructions were given to patient.  Results were reviewed/authorized by Cloyde Reams, RN, BSN.  He was notified by Cloyde Reams RN.         Prior Anticoagulation Instructions: INR 2.2  Continue on same dosage 1 tablet daily except 1.5 tablets on Tuesdays, Thursdays, and Saturdays.  Recheck in 4 weeks.    Current Anticoagulation Instructions: INR 2.3  Continue on same dosage 1 tablet daily except 1.5 tablets on Tuesdays,  Thursdays, and Saturdays.  Recheck in 4 weeks.   Prescriptions: WARFARIN SODIUM 5 MG TABS (WARFARIN SODIUM) Use as directed by Anticoagulation Clinic  #60 x 3   Entered by:   Cloyde Reams RN   Authorized by:   Marca Ancona, MD   Signed by:   Cloyde Reams RN on 05/19/2010   Method used:   Electronically to        [facility]* (retail)       [address]       [place], [place]  [address]       Ph: [phone]       Fax: [phone]   RxID:   [id number]

## 2010-12-31 NOTE — Medication Information (Signed)
Summary: ccr  Anticoagulant Therapy  Managed by: Charlena Cross, RN, BSN Referring MD: Verne Carrow MD PCP: Julieanne Manson, MD Supervising MD: Clifton James MD, Cristal Deer Indication 1: Atrial Fibrillation (ICD-427.31) [place] Site: [place] INR POC 1.9 INR RANGE 2.0-2.5  Dietary changes: no    Health status changes: no    Bleeding/hemorrhagic complications: no    Recent/future hospitalizations: no    Any changes in medication regimen? no    Recent/future dental: no  Any missed doses?: no       Is patient compliant with meds? yes       Allergies: No Known Drug Allergies  Anticoagulation Management History:      The patient is taking warfarin and comes in today for a routine follow up visit.  Positive risk factors for bleeding include presence of serious comorbidities.  Negative risk factors for bleeding include an age less than 93 years old.  The bleeding index is 'intermediate risk'.  Positive CHADS2 values include History of CHF and History of HTN.  Negative CHADS2 values include Age > 43 years old.  The start date was 11/29/2008.  Anticoagulation responsible provider: Clifton James MD, Cristal Deer.  INR POC: 1.9.    Anticoagulation Management Assessment/Plan:      The patient's current anticoagulation dose is Warfarin sodium 5 mg tabs: Use as directed by Anticoagulation Clinic.  The target INR is 0 - 0.  The next INR is due 01/16/2010.  Results were reviewed/authorized by Charlena Cross, RN, BSN.  He was notified by Charlena Cross, RN, BSN.         Prior Anticoagulation Instructions: The patient is to continue with the same dose of coumadin.  This dosage includes: coumadin 5 mg daily with 7.5 mg on Tues  Current Anticoagulation Instructions: coumadin 7.5 mg today then resume dose of coumadin 5 mg daily with 7.5 mg on Tues.

## 2011-02-06 ENCOUNTER — Encounter: Payer: Self-pay | Admitting: Cardiology

## 2011-02-09 ENCOUNTER — Encounter: Payer: Self-pay | Admitting: Cardiology

## 2011-02-09 ENCOUNTER — Ambulatory Visit: Payer: Medicare Other | Admitting: Internal Medicine

## 2011-02-10 ENCOUNTER — Ambulatory Visit: Payer: Self-pay | Admitting: Cardiology

## 2011-02-16 ENCOUNTER — Telehealth: Payer: Self-pay | Admitting: Cardiology

## 2011-02-16 NOTE — Telephone Encounter (Signed)
[facility]   Imported By: Harlon Flor 02/11/2011 10:58:17  _____________________________________________________________________  External Attachment:    Type: Image     Comment: External Document   Signed by Lanny Hurst RN on 02/12/2011 at 2:05 PM  Signed by Marca Ancona, MD on 02/14/2011 at 3:25 PM ________________________________________________________________________ Pt had appt 3/14, rescheduled for 4/13 with Dr. Shirlee Latch. Labs from [facility], low H/H. Preliminarily reviewed. Forwarded to MD desktop for review and signature /MES   Signed by Lanny Hurst RN on 02/12/2011 at 2:05 PM  Signed by Marca Ancona, MD on 02/14/2011 at 3:25 PM ________________________________________________________________________ Already off coumadin.  Needs anemia evaluation via PCP   Signed by Marca Ancona, MD on 02/14/2011 at 3:25 PM  ________________________________________________________________________

## 2011-02-22 NOTE — Telephone Encounter (Signed)
Pt has been seeing Hematology (Dr. Kerin Salen) with weekly Procrit injections and bloodwork. Pt is due tomorrow. Will request notes/labs. Pt will f/u with Dr. Shirlee Latch 03/12/11.

## 2011-02-28 ENCOUNTER — Ambulatory Visit: Payer: Medicare Other | Admitting: Internal Medicine

## 2011-03-03 ENCOUNTER — Encounter: Payer: Self-pay | Admitting: Cardiology

## 2011-03-12 ENCOUNTER — Ambulatory Visit: Payer: Self-pay | Admitting: Cardiology

## 2011-03-16 LAB — POCT I-STAT 3, ART BLOOD GAS (G3+)
Acid-Base Excess: 2 mmol/L (ref 0.0–2.0)
Bicarbonate: 27.3 mEq/L — ABNORMAL HIGH (ref 20.0–24.0)
Bicarbonate: 28.7 mEq/L — ABNORMAL HIGH (ref 20.0–24.0)
Bicarbonate: 29.4 mEq/L — ABNORMAL HIGH (ref 20.0–24.0)
Bicarbonate: 32.5 mEq/L — ABNORMAL HIGH (ref 20.0–24.0)
Bicarbonate: 31.2 mEq/L — ABNORMAL HIGH (ref 20.0–24.0)
O2 Saturation: 89 %
O2 Saturation: 94 %
O2 Saturation: 98 %
O2 Saturation: 96 %
O2 Saturation: 98 %
Patient temperature: 98.7
Patient temperature: 99
Patient temperature: 99.6
TCO2: 30 mmol/L (ref 0–100)
TCO2: 31 mmol/L (ref 0–100)
TCO2: 34 mmol/L (ref 0–100)
pCO2 arterial: 46.2 mmHg — ABNORMAL HIGH (ref 35.0–45.0)
pCO2 arterial: 49 mmHg — ABNORMAL HIGH (ref 35.0–45.0)
pCO2 arterial: 43.6 mmHg (ref 35.0–45.0)
pCO2 arterial: 45.1 mmHg — ABNORMAL HIGH (ref 35.0–45.0)
pCO2 arterial: 47 mmHg — ABNORMAL HIGH (ref 35.0–45.0)
pH, Arterial: 7.373 (ref 7.350–7.450)
pH, Arterial: 7.383 (ref 7.350–7.450)
pH, Arterial: 7.416 (ref 7.350–7.450)
pH, Arterial: 7.455 — ABNORMAL HIGH (ref 7.350–7.450)
pO2, Arterial: 101 mmHg — ABNORMAL HIGH (ref 80.0–100.0)
pO2, Arterial: 342 mmHg — ABNORMAL HIGH (ref 80.0–100.0)
pO2, Arterial: 78 mmHg — ABNORMAL LOW (ref 80.0–100.0)
pO2, Arterial: 82 mmHg (ref 80.0–100.0)
pO2, Arterial: 95 mmHg (ref 80.0–100.0)

## 2011-03-16 LAB — GLUCOSE, CAPILLARY
Glucose-Capillary: 103 mg/dL — ABNORMAL HIGH (ref 70–99)
Glucose-Capillary: 107 mg/dL — ABNORMAL HIGH (ref 70–99)
Glucose-Capillary: 110 mg/dL — ABNORMAL HIGH (ref 70–99)
Glucose-Capillary: 113 mg/dL — ABNORMAL HIGH (ref 70–99)
Glucose-Capillary: 114 mg/dL — ABNORMAL HIGH (ref 70–99)
Glucose-Capillary: 118 mg/dL — ABNORMAL HIGH (ref 70–99)
Glucose-Capillary: 118 mg/dL — ABNORMAL HIGH (ref 70–99)
Glucose-Capillary: 120 mg/dL — ABNORMAL HIGH (ref 70–99)
Glucose-Capillary: 122 mg/dL — ABNORMAL HIGH (ref 70–99)
Glucose-Capillary: 123 mg/dL — ABNORMAL HIGH (ref 70–99)
Glucose-Capillary: 125 mg/dL — ABNORMAL HIGH (ref 70–99)
Glucose-Capillary: 135 mg/dL — ABNORMAL HIGH (ref 70–99)
Glucose-Capillary: 144 mg/dL — ABNORMAL HIGH (ref 70–99)
Glucose-Capillary: 144 mg/dL — ABNORMAL HIGH (ref 70–99)
Glucose-Capillary: 147 mg/dL — ABNORMAL HIGH (ref 70–99)
Glucose-Capillary: 147 mg/dL — ABNORMAL HIGH (ref 70–99)
Glucose-Capillary: 150 mg/dL — ABNORMAL HIGH (ref 70–99)
Glucose-Capillary: 150 mg/dL — ABNORMAL HIGH (ref 70–99)
Glucose-Capillary: 151 mg/dL — ABNORMAL HIGH (ref 70–99)
Glucose-Capillary: 155 mg/dL — ABNORMAL HIGH (ref 70–99)
Glucose-Capillary: 158 mg/dL — ABNORMAL HIGH (ref 70–99)
Glucose-Capillary: 162 mg/dL — ABNORMAL HIGH (ref 70–99)
Glucose-Capillary: 165 mg/dL — ABNORMAL HIGH (ref 70–99)
Glucose-Capillary: 169 mg/dL — ABNORMAL HIGH (ref 70–99)
Glucose-Capillary: 172 mg/dL — ABNORMAL HIGH (ref 70–99)
Glucose-Capillary: 179 mg/dL — ABNORMAL HIGH (ref 70–99)
Glucose-Capillary: 185 mg/dL — ABNORMAL HIGH (ref 70–99)
Glucose-Capillary: 192 mg/dL — ABNORMAL HIGH (ref 70–99)
Glucose-Capillary: 204 mg/dL — ABNORMAL HIGH (ref 70–99)
Glucose-Capillary: 206 mg/dL — ABNORMAL HIGH (ref 70–99)
Glucose-Capillary: 218 mg/dL — ABNORMAL HIGH (ref 70–99)
Glucose-Capillary: 222 mg/dL — ABNORMAL HIGH (ref 70–99)
Glucose-Capillary: 228 mg/dL — ABNORMAL HIGH (ref 70–99)
Glucose-Capillary: 231 mg/dL — ABNORMAL HIGH (ref 70–99)
Glucose-Capillary: 90 mg/dL (ref 70–99)
Glucose-Capillary: 99 mg/dL (ref 70–99)
Glucose-Capillary: 101 mg/dL — ABNORMAL HIGH (ref 70–99)
Glucose-Capillary: 101 mg/dL — ABNORMAL HIGH (ref 70–99)
Glucose-Capillary: 102 mg/dL — ABNORMAL HIGH (ref 70–99)
Glucose-Capillary: 105 mg/dL — ABNORMAL HIGH (ref 70–99)
Glucose-Capillary: 105 mg/dL — ABNORMAL HIGH (ref 70–99)
Glucose-Capillary: 107 mg/dL — ABNORMAL HIGH (ref 70–99)
Glucose-Capillary: 117 mg/dL — ABNORMAL HIGH (ref 70–99)
Glucose-Capillary: 117 mg/dL — ABNORMAL HIGH (ref 70–99)
Glucose-Capillary: 117 mg/dL — ABNORMAL HIGH (ref 70–99)
Glucose-Capillary: 120 mg/dL — ABNORMAL HIGH (ref 70–99)
Glucose-Capillary: 123 mg/dL — ABNORMAL HIGH (ref 70–99)
Glucose-Capillary: 124 mg/dL — ABNORMAL HIGH (ref 70–99)
Glucose-Capillary: 125 mg/dL — ABNORMAL HIGH (ref 70–99)
Glucose-Capillary: 125 mg/dL — ABNORMAL HIGH (ref 70–99)
Glucose-Capillary: 125 mg/dL — ABNORMAL HIGH (ref 70–99)
Glucose-Capillary: 131 mg/dL — ABNORMAL HIGH (ref 70–99)
Glucose-Capillary: 132 mg/dL — ABNORMAL HIGH (ref 70–99)
Glucose-Capillary: 134 mg/dL — ABNORMAL HIGH (ref 70–99)
Glucose-Capillary: 134 mg/dL — ABNORMAL HIGH (ref 70–99)
Glucose-Capillary: 134 mg/dL — ABNORMAL HIGH (ref 70–99)
Glucose-Capillary: 141 mg/dL — ABNORMAL HIGH (ref 70–99)
Glucose-Capillary: 145 mg/dL — ABNORMAL HIGH (ref 70–99)
Glucose-Capillary: 148 mg/dL — ABNORMAL HIGH (ref 70–99)
Glucose-Capillary: 152 mg/dL — ABNORMAL HIGH (ref 70–99)
Glucose-Capillary: 156 mg/dL — ABNORMAL HIGH (ref 70–99)
Glucose-Capillary: 159 mg/dL — ABNORMAL HIGH (ref 70–99)
Glucose-Capillary: 163 mg/dL — ABNORMAL HIGH (ref 70–99)
Glucose-Capillary: 163 mg/dL — ABNORMAL HIGH (ref 70–99)
Glucose-Capillary: 172 mg/dL — ABNORMAL HIGH (ref 70–99)
Glucose-Capillary: 176 mg/dL — ABNORMAL HIGH (ref 70–99)
Glucose-Capillary: 177 mg/dL — ABNORMAL HIGH (ref 70–99)
Glucose-Capillary: 184 mg/dL — ABNORMAL HIGH (ref 70–99)
Glucose-Capillary: 79 mg/dL (ref 70–99)
Glucose-Capillary: 79 mg/dL (ref 70–99)
Glucose-Capillary: 82 mg/dL (ref 70–99)
Glucose-Capillary: 90 mg/dL (ref 70–99)
Glucose-Capillary: 91 mg/dL (ref 70–99)
Glucose-Capillary: 95 mg/dL (ref 70–99)
Glucose-Capillary: 97 mg/dL (ref 70–99)
Glucose-Capillary: 97 mg/dL (ref 70–99)
Glucose-Capillary: 98 mg/dL (ref 70–99)

## 2011-03-16 LAB — BASIC METABOLIC PANEL
BUN: 15 mg/dL (ref 6–23)
BUN: 18 mg/dL (ref 6–23)
BUN: 22 mg/dL (ref 6–23)
BUN: 14 mg/dL (ref 6–23)
BUN: 17 mg/dL (ref 6–23)
BUN: 18 mg/dL (ref 6–23)
BUN: 19 mg/dL (ref 6–23)
BUN: 19 mg/dL (ref 6–23)
BUN: 20 mg/dL (ref 6–23)
CO2: 33 mEq/L — ABNORMAL HIGH (ref 19–32)
CO2: 22 mEq/L (ref 19–32)
CO2: 22 mEq/L (ref 19–32)
CO2: 24 mEq/L (ref 19–32)
CO2: 26 mEq/L (ref 19–32)
Calcium: 8.1 mg/dL — ABNORMAL LOW (ref 8.4–10.5)
Calcium: 8.2 mg/dL — ABNORMAL LOW (ref 8.4–10.5)
Calcium: 8.5 mg/dL (ref 8.4–10.5)
Calcium: 8.6 mg/dL (ref 8.4–10.5)
Calcium: 8.7 mg/dL (ref 8.4–10.5)
Calcium: 8.8 mg/dL (ref 8.4–10.5)
Calcium: 9.1 mg/dL (ref 8.4–10.5)
Chloride: 100 mEq/L (ref 96–112)
Chloride: 101 mEq/L (ref 96–112)
Chloride: 99 mEq/L (ref 96–112)
Chloride: 100 mEq/L (ref 96–112)
Chloride: 101 mEq/L (ref 96–112)
Chloride: 104 mEq/L (ref 96–112)
Chloride: 105 mEq/L (ref 96–112)
Chloride: 106 mEq/L (ref 96–112)
Creatinine, Ser: 1.55 mg/dL — ABNORMAL HIGH (ref 0.4–1.5)
Creatinine, Ser: 1.95 mg/dL — ABNORMAL HIGH (ref 0.4–1.5)
Creatinine, Ser: 1.43 mg/dL (ref 0.4–1.5)
Creatinine, Ser: 1.6 mg/dL — ABNORMAL HIGH (ref 0.4–1.5)
Creatinine, Ser: 1.61 mg/dL — ABNORMAL HIGH (ref 0.4–1.5)
Creatinine, Ser: 1.69 mg/dL — ABNORMAL HIGH (ref 0.4–1.5)
Creatinine, Ser: 1.71 mg/dL — ABNORMAL HIGH (ref 0.4–1.5)
Creatinine, Ser: 1.79 mg/dL — ABNORMAL HIGH (ref 0.4–1.5)
GFR calc Af Amer: 55 mL/min — ABNORMAL LOW (ref >60)
GFR calc Af Amer: 47 mL/min — ABNORMAL LOW (ref >60)
GFR calc Af Amer: 47 mL/min — ABNORMAL LOW (ref >60)
GFR calc Af Amer: 50 mL/min — ABNORMAL LOW (ref >60)
GFR calc Af Amer: 53 mL/min — ABNORMAL LOW (ref >60)
GFR calc Af Amer: 53 mL/min — ABNORMAL LOW (ref >60)
GFR calc Af Amer: 53 mL/min — ABNORMAL LOW (ref >60)
GFR calc non Af Amer: 35 mL/min — ABNORMAL LOW (ref >60)
GFR calc non Af Amer: 37 mL/min — ABNORMAL LOW (ref >60)
GFR calc non Af Amer: 39 mL/min — ABNORMAL LOW (ref >60)
GFR calc non Af Amer: 39 mL/min — ABNORMAL LOW (ref >60)
GFR calc non Af Amer: 41 mL/min — ABNORMAL LOW (ref >60)
GFR calc non Af Amer: 44 mL/min — ABNORMAL LOW (ref >60)
GFR calc non Af Amer: 50 mL/min — ABNORMAL LOW (ref >60)
Glucose, Bld: 241 mg/dL — ABNORMAL HIGH (ref 70–99)
Glucose, Bld: 85 mg/dL (ref 70–99)
Glucose, Bld: 97 mg/dL (ref 70–99)
Glucose, Bld: 109 mg/dL — ABNORMAL HIGH (ref 70–99)
Glucose, Bld: 132 mg/dL — ABNORMAL HIGH (ref 70–99)
Glucose, Bld: 87 mg/dL (ref 70–99)
Glucose, Bld: 91 mg/dL (ref 70–99)
Potassium: 3.1 mEq/L — ABNORMAL LOW (ref 3.5–5.1)
Potassium: 3.9 mEq/L (ref 3.5–5.1)
Potassium: 4 mEq/L (ref 3.5–5.1)
Potassium: 4.2 mEq/L (ref 3.5–5.1)
Potassium: 3.6 mEq/L (ref 3.5–5.1)
Potassium: 3.9 mEq/L (ref 3.5–5.1)
Potassium: 3.9 mEq/L (ref 3.5–5.1)
Potassium: 4.3 mEq/L (ref 3.5–5.1)
Potassium: 4.3 mEq/L (ref 3.5–5.1)
Sodium: 138 mEq/L (ref 135–145)
Sodium: 131 mEq/L — ABNORMAL LOW (ref 135–145)
Sodium: 135 mEq/L (ref 135–145)
Sodium: 138 mEq/L (ref 135–145)
Sodium: 139 mEq/L (ref 135–145)
Sodium: 140 mEq/L (ref 135–145)

## 2011-03-16 LAB — COMPREHENSIVE METABOLIC PANEL
BUN: 29 mg/dL — ABNORMAL HIGH (ref 6–23)
CO2: 26 mEq/L (ref 19–32)
Calcium: 8.1 mg/dL — ABNORMAL LOW (ref 8.4–10.5)
Chloride: 98 mEq/L (ref 96–112)
Creatinine, Ser: 2.26 mg/dL — ABNORMAL HIGH (ref 0.4–1.5)
GFR calc Af Amer: 36 mL/min — ABNORMAL LOW (ref >60)
GFR calc non Af Amer: 29 mL/min — ABNORMAL LOW (ref >60)
Glucose, Bld: 146 mg/dL — ABNORMAL HIGH (ref 70–99)
Total Bilirubin: 1 mg/dL (ref 0.3–1.2)

## 2011-03-16 LAB — CBC
HCT: 28.2 % — ABNORMAL LOW (ref 39.0–52.0)
HCT: 29.2 % — ABNORMAL LOW (ref 39.0–52.0)
HCT: 29.4 % — ABNORMAL LOW (ref 39.0–52.0)
HCT: 29.6 % — ABNORMAL LOW (ref 39.0–52.0)
HCT: 30.2 % — ABNORMAL LOW (ref 39.0–52.0)
HCT: 28.8 % — ABNORMAL LOW (ref 39.0–52.0)
HCT: 30.3 % — ABNORMAL LOW (ref 39.0–52.0)
HCT: 30.5 % — ABNORMAL LOW (ref 39.0–52.0)
HCT: 31.7 % — ABNORMAL LOW (ref 39.0–52.0)
HCT: 32.6 % — ABNORMAL LOW (ref 39.0–52.0)
HCT: 34.1 % — ABNORMAL LOW (ref 39.0–52.0)
HCT: 35.1 % — ABNORMAL LOW (ref 39.0–52.0)
Hemoglobin: 10 g/dL — ABNORMAL LOW (ref 13.0–17.0)
Hemoglobin: 10.3 g/dL — ABNORMAL LOW (ref 13.0–17.0)
Hemoglobin: 9.9 g/dL — ABNORMAL LOW (ref 13.0–17.0)
Hemoglobin: 10.1 g/dL — ABNORMAL LOW (ref 13.0–17.0)
Hemoglobin: 10.4 g/dL — ABNORMAL LOW (ref 13.0–17.0)
Hemoglobin: 10.8 g/dL — ABNORMAL LOW (ref 13.0–17.0)
Hemoglobin: 11.1 g/dL — ABNORMAL LOW (ref 13.0–17.0)
Hemoglobin: 11.2 g/dL — ABNORMAL LOW (ref 13.0–17.0)
Hemoglobin: 11.4 g/dL — ABNORMAL LOW (ref 13.0–17.0)
Hemoglobin: 11.7 g/dL — ABNORMAL LOW (ref 13.0–17.0)
Hemoglobin: 9.8 g/dL — ABNORMAL LOW (ref 13.0–17.0)
MCHC: 33.6 g/dL (ref 30.0–36.0)
MCHC: 34.2 g/dL (ref 30.0–36.0)
MCHC: 33.7 g/dL (ref 30.0–36.0)
MCHC: 34 g/dL (ref 30.0–36.0)
MCHC: 34.2 g/dL (ref 30.0–36.0)
MCHC: 34.3 g/dL (ref 30.0–36.0)
MCV: 87.5 fL (ref 78.0–100.0)
MCV: 88.2 fL (ref 78.0–100.0)
MCV: 88.3 fL (ref 78.0–100.0)
MCV: 89 fL (ref 78.0–100.0)
MCV: 90 fL (ref 78.0–100.0)
MCV: 87.5 fL (ref 78.0–100.0)
MCV: 88.6 fL (ref 78.0–100.0)
MCV: 88.7 fL (ref 78.0–100.0)
MCV: 88.7 fL (ref 78.0–100.0)
MCV: 88.8 fL (ref 78.0–100.0)
MCV: 89 fL (ref 78.0–100.0)
Platelets: 194 10*3/uL (ref 150–400)
Platelets: 226 10*3/uL (ref 150–400)
Platelets: 240 10*3/uL (ref 150–400)
Platelets: 242 10*3/uL (ref 150–400)
Platelets: 255 10*3/uL (ref 150–400)
Platelets: 323 10*3/uL (ref 150–400)
Platelets: 361 10*3/uL (ref 150–400)
Platelets: 367 10*3/uL (ref 150–400)
Platelets: 413 10*3/uL — ABNORMAL HIGH (ref 150–400)
Platelets: 439 10*3/uL — ABNORMAL HIGH (ref 150–400)
Platelets: 458 10*3/uL — ABNORMAL HIGH (ref 150–400)
Platelets: 464 10*3/uL — ABNORMAL HIGH (ref 150–400)
RBC: 3.17 MIL/uL — ABNORMAL LOW (ref 4.22–5.81)
RBC: 3.45 MIL/uL — ABNORMAL LOW (ref 4.22–5.81)
RBC: 3.25 MIL/uL — ABNORMAL LOW (ref 4.22–5.81)
RBC: 3.43 MIL/uL — ABNORMAL LOW (ref 4.22–5.81)
RBC: 3.57 MIL/uL — ABNORMAL LOW (ref 4.22–5.81)
RBC: 3.59 MIL/uL — ABNORMAL LOW (ref 4.22–5.81)
RBC: 3.59 MIL/uL — ABNORMAL LOW (ref 4.22–5.81)
RBC: 3.7 MIL/uL — ABNORMAL LOW (ref 4.22–5.81)
RBC: 3.77 MIL/uL — ABNORMAL LOW (ref 4.22–5.81)
RBC: 3.98 MIL/uL — ABNORMAL LOW (ref 4.22–5.81)
RDW: 14.8 % (ref 11.5–15.5)
RDW: 14.8 % (ref 11.5–15.5)
RDW: 14.9 % (ref 11.5–15.5)
RDW: 14.9 % (ref 11.5–15.5)
RDW: 14.9 % (ref 11.5–15.5)
RDW: 15.2 % (ref 11.5–15.5)
RDW: 15.2 % (ref 11.5–15.5)
RDW: 15.4 % (ref 11.5–15.5)
WBC: 7 10*3/uL (ref 4.0–10.5)
WBC: 7.2 10*3/uL (ref 4.0–10.5)
WBC: 7.7 10*3/uL (ref 4.0–10.5)
WBC: 8.6 10*3/uL (ref 4.0–10.5)
WBC: 10.4 10*3/uL (ref 4.0–10.5)
WBC: 11.7 10*3/uL — ABNORMAL HIGH (ref 4.0–10.5)
WBC: 6.1 10*3/uL (ref 4.0–10.5)
WBC: 7.3 10*3/uL (ref 4.0–10.5)
WBC: 7.3 10*3/uL (ref 4.0–10.5)
WBC: 8.4 10*3/uL (ref 4.0–10.5)
WBC: 9.3 10*3/uL (ref 4.0–10.5)

## 2011-03-16 LAB — DIFFERENTIAL
Basophils Absolute: 0.1 10*3/uL (ref 0.0–0.1)
Basophils Absolute: 0 10*3/uL (ref 0.0–0.1)
Basophils Relative: 0 % (ref 0–1)
Eosinophils Absolute: 0.1 10*3/uL (ref 0.0–0.7)
Lymphocytes Relative: 11 % — ABNORMAL LOW (ref 12–46)
Lymphs Abs: 1 10*3/uL (ref 0.7–4.0)
Neutro Abs: 6.9 10*3/uL (ref 1.7–7.7)
Neutro Abs: 6 10*3/uL (ref 1.7–7.7)
Neutrophils Relative %: 76 % (ref 43–77)
Neutrophils Relative %: 62 % (ref 43–77)

## 2011-03-16 LAB — CARDIAC PANEL(CRET KIN+CKTOT+MB+TROPI)
CK, MB: 1.2 ng/mL (ref 0.3–4.0)
Relative Index: 0.4 (ref 0.0–2.5)
Total CK: 337 U/L — ABNORMAL HIGH (ref 7–232)
Troponin I: 0.01 ng/mL (ref 0.00–0.06)

## 2011-03-16 LAB — FOLATE: Folate: >20 ng/mL

## 2011-03-16 LAB — IRON AND TIBC
Iron: 78 ug/dL (ref 42–135)
Saturation Ratios: 26 % (ref 20–55)
TIBC: 300 ug/dL (ref 215–435)

## 2011-03-16 LAB — PROTIME-INR
INR: 1.8 — ABNORMAL HIGH (ref 0.00–1.49)
INR: 1.4 (ref 0.00–1.49)
INR: 1.5 (ref 0.00–1.49)
INR: 1.8 — ABNORMAL HIGH (ref 0.00–1.49)
Prothrombin Time: 21.5 seconds — ABNORMAL HIGH (ref 11.6–15.2)

## 2011-03-16 LAB — MAGNESIUM
Magnesium: 2.2 mg/dL (ref 1.5–2.5)
Magnesium: 2.2 mg/dL (ref 1.5–2.5)
Magnesium: 2.6 mg/dL — ABNORMAL HIGH (ref 1.5–2.5)

## 2011-03-16 LAB — RENAL FUNCTION PANEL
CO2: 29 mEq/L (ref 19–32)
Calcium: 7.9 mg/dL — ABNORMAL LOW (ref 8.4–10.5)
Chloride: 98 mEq/L (ref 96–112)
Creatinine, Ser: 2.04 mg/dL — ABNORMAL HIGH (ref 0.4–1.5)
GFR calc non Af Amer: 33 mL/min — ABNORMAL LOW (ref >60)
Glucose, Bld: 153 mg/dL — ABNORMAL HIGH (ref 70–99)

## 2011-03-16 LAB — HEPARIN LEVEL (UNFRACTIONATED)
Heparin Unfractionated: 0.12 IU/mL — ABNORMAL LOW (ref 0.30–0.70)
Heparin Unfractionated: 0.23 IU/mL — ABNORMAL LOW (ref 0.30–0.70)
Heparin Unfractionated: <0.1 IU/mL — ABNORMAL LOW (ref 0.30–0.70)
Heparin Unfractionated: 0.44 IU/mL (ref 0.30–0.70)
Heparin Unfractionated: 0.68 IU/mL (ref 0.30–0.70)
Heparin Unfractionated: 0.7 IU/mL (ref 0.30–0.70)
Heparin Unfractionated: 0.72 IU/mL — ABNORMAL HIGH (ref 0.30–0.70)
Heparin Unfractionated: 0.81 IU/mL — ABNORMAL HIGH (ref 0.30–0.70)

## 2011-03-16 LAB — HEMOGLOBIN A1C: Mean Plasma Glucose: 163 mg/dL

## 2011-03-16 LAB — PHOSPHORUS: Phosphorus: 3.3 mg/dL (ref 2.3–4.6)

## 2011-03-16 LAB — RETICULOCYTES: Retic Count, Absolute: 51.7 10*3/uL (ref 19.0–186.0)

## 2011-03-16 LAB — FERRITIN: Ferritin: 472 ng/mL — ABNORMAL HIGH (ref 22–322)

## 2011-03-16 LAB — LIPASE, BLOOD: Lipase: 18 U/L (ref 11–59)

## 2011-03-30 ENCOUNTER — Ambulatory Visit: Payer: Medicare Other | Admitting: Internal Medicine

## 2011-04-08 ENCOUNTER — Encounter: Payer: Self-pay | Admitting: Cardiology

## 2011-04-08 ENCOUNTER — Ambulatory Visit (INDEPENDENT_AMBULATORY_CARE_PROVIDER_SITE_OTHER): Payer: Medicare Other | Admitting: Cardiology

## 2011-04-08 DIAGNOSIS — Z79899 Other long term (current) drug therapy: Secondary | ICD-10-CM

## 2011-04-08 DIAGNOSIS — E785 Hyperlipidemia, unspecified: Secondary | ICD-10-CM

## 2011-04-08 DIAGNOSIS — I25810 Atherosclerosis of coronary artery bypass graft(s) without angina pectoris: Secondary | ICD-10-CM

## 2011-04-08 DIAGNOSIS — I5032 Chronic diastolic (congestive) heart failure: Secondary | ICD-10-CM

## 2011-04-08 DIAGNOSIS — E7800 Pure hypercholesterolemia, unspecified: Secondary | ICD-10-CM

## 2011-04-08 DIAGNOSIS — R0602 Shortness of breath: Secondary | ICD-10-CM

## 2011-04-08 DIAGNOSIS — I4891 Unspecified atrial fibrillation: Secondary | ICD-10-CM

## 2011-04-08 DIAGNOSIS — I509 Heart failure, unspecified: Secondary | ICD-10-CM

## 2011-04-08 NOTE — Patient Instructions (Addendum)
Follow up in 6 months. Continue with same medications. Need to have a Liver, Lipid, B Met & BNP.

## 2011-04-10 DIAGNOSIS — E785 Hyperlipidemia, unspecified: Secondary | ICD-10-CM | POA: Insufficient documentation

## 2011-04-10 NOTE — Assessment & Plan Note (Signed)
Patient appears euvolemic today.  Continue current dose of Lasix.

## 2011-04-10 NOTE — Progress Notes (Signed)
PCP: Dr. Sullivan Lone  65 yo with history of CAD s/p CABG 1995, atrial fib/flutter s/p atrial flutter ablation 2/10, HTN, gait instability/peripheral neuropathy, and diastolic CHF returns for followup.  He has continued to have periodic falls thought to be due to peripheral neuropathy with imbalance. He does not get lightheaded or pass out.  He is using a cane.  He also has anemia with HCT getting down to as low as 25 in 3/12.  He has been seeing a hematologist (Dr. Kerin Salen) and getting Procrit shots.  He is not sure what the underlying cause for his anemia is.  He has had GI workups and bone marrow biopsies in the past.  No chest pain or exertional dyspnea, though he is not very active due to difficulty with balance.    Labs (9/10): K 4.7, creatinine 1.93.  Labs (10/10): HDL 38, LDL 86 Labs (3/11): K 4.2, creatinine 1.7 Labs (8/11): K 4.3, creatinine 1.67, LDL 68, HDL 47 Labs (12/11): K 4.4, creatinine 1.4, HCT 36 Labs (3/12): HCT 25 Labs (5/12): hgb 13  Allergies (verified):  No Known Drug Allergies  Family History: Noncontributory.   Social History: Disabled, lives in [place].  Formerly was a Clinical research associate.  Tobacco Use - Former, quit 7/10.  Alcohol Use - no  Past Medical History: 1. Coronary artery disease status post coronary artery bypass grafting surgery in 1995. 2. Chronic kidney disease stage III. 3. Chronic anemia secondary to underlying kidney disease. 4. Type 2 diabetes mellitus. 5. History of nephrolithiasis. 6. Essential hypertension. 7. 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  Has led to gait instability.  Seen by [facility], head MRI showed only mild diffuse atrophy with moderate peri-sylvan atrophy.  8. Post-traumatic stress disorder. Takes prazosin for this.  9. Hernia repair. 10. Rotator cuff repair. 11. Atrial fibrillation/atrial flutter with ablation of flutter 2/10. Holter monitor 9/10 showed predominantly NSR with some short runs of atrial fibrillation.   Coumadin stopped in 7/11 due to frequent falls.  12. Diastolic CHF: Last echo was in 9/10 showing EF 50-55%, mild LVH, grade I diastolic dysfunction, mild MR.  13. OSA: Unable to tolerate CPAP because of allergic reaction to straps of face mask.  14. Carotid dopplers (10/10): bilateral ICAs did not show significant disease. The right vertebral may have been occluded.  Has right carotid bruit.   ROS: All systems reviewed and negative except as per HPI.   Current Outpatient Prescriptions  Medication Sig Dispense Refill  . aspirin 325 MG tablet Take 325 mg by mouth daily.        Marland Kitchen buPROPion (WELLBUTRIN XL) 150 MG 24 hr tablet Take 150 mg by mouth 2 (two) times daily.        . clonazePAM (KLONOPIN) 1 MG tablet Take 1 mg by mouth. 1 tab daily at noon, 2 tabs at bedtime       . docusate sodium (COLACE) 100 MG capsule Take 2 mg by mouth daily.        . Emollient (EUCERIN PLUS) 2.5-10 % CREA Apply 1 application topically daily as needed.        . furosemide (LASIX) 40 MG tablet Take 40 mg by mouth daily.        Marland Kitchen gabapentin (NEURONTIN) 300 MG capsule Take 300 mg by mouth daily.        . insulin aspart (NOVOLOG) 100 UNIT/ML injection Inject 30 Units into the skin at bedtime.       . insulin glargine (LANTUS) 100 UNIT/ML injection Inject 24 Units  into the skin at bedtime.        Marland Kitchen ketoconazole (NIZORAL) 2 % shampoo Apply 1 application topically daily. For head       . methadone (DOLOPHINE) 10 MG tablet Take 10 mg by mouth 3 (three) times daily.        . metoprolol (TOPROL-XL) 50 MG 24 hr tablet Take 50 mg by mouth daily.        . Niacin CR 1000 MG TBCR Take 1 tablet by mouth daily.        . prazosin (MINIPRESS) 5 MG capsule Take 5 mg by mouth. 1 tab at noon/ 2 tabs at bedtime       . rosuvastatin (CRESTOR) 20 MG tablet Take 20 mg by mouth daily.        . tacrolimus (PROTOPIC) 0.03 % ointment Apply 1 application topically. Apply to face and neck 3 times a week        . Venlafaxine HCl 225 MG TB24  Take 1 tablet by mouth daily.        . ergocalciferol (VITAMIN D2) 50000 UNITS capsule Take 50,000 Units by mouth once a week.        Marland Kitchen omeprazole (PRILOSEC) 20 MG capsule Take 20 mg by mouth daily.        . tadalafil (CIALIS) 5 MG tablet Take 5 mg by mouth daily.        Marland Kitchen testosterone cypionate (DEPOTESTOTERONE CYPIONATE) 100 MG/ML injection Inject 400 mg into the muscle every 14 (fourteen) days.          BP 140/72  Pulse 101  Ht 5\' 8"  (1.727 m)  Wt 189 lb 6.4 oz (85.911 kg)  BMI 28.80 kg/m2 General:  Chronically ill-appearing Neck:  Neck supple, no JVD. No masses, thyromegaly or abnormal cervical nodes. Lungs:  Clear bilaterally to auscultation and percussion. Heart:  Non-displaced PMI, chest non-tender; regular rate and rhythm, S1, S2 without rubs or gallops. 1/6 crescendo-descrescendo murmur at RUSB.  Carotid upstroke normal, right carotid bruit. Trace ankle edema.  2+ PT pulses bilaterally.  Abdomen:  Bowel sounds positive; abdomen soft and non-tender without masses, organomegaly, or hernias noted. No hepatosplenomegaly. Extremities:  No clubbing or cyanosis. Neurologic:  Alert and oriented x 3. Psych:  Flat affect.

## 2011-04-10 NOTE — Assessment & Plan Note (Signed)
Stable with no ischemic symptoms. Continue ASA, Toprol XL, Crestor.

## 2011-04-10 NOTE — Assessment & Plan Note (Signed)
Check lipids/LFTs today, goal LDL < 70.  

## 2011-04-10 NOTE — Assessment & Plan Note (Signed)
Patient has h/o atrial fibrillation.  On holter, he was noted to have short bursts of paroxysmal atrial fibrillation.  He is on metoprolol to control his rate.  He is at increased risk of stroke with CHADS2 score 2 (CHF, HTN).  However, he is having frequent falls (mechanical, seem to be due to gait difficulty from peripheral neuropathy).  He is off coumadin because of the falls. He is taking ASA 325 mg daily.

## 2011-04-13 ENCOUNTER — Other Ambulatory Visit: Payer: Medicare Other | Admitting: *Deleted

## 2011-04-13 NOTE — Assessment & Plan Note (Signed)
[facility] OFFICE NOTE   CAMDON, SAETERN                       MRN:          [id number]  DATE:12/02/2008                            DOB:          [birth year]    REASON FOR VISIT:  Atrial fibrillation diagnosis established in the  [facility] last week.   HISTORY OF PRESENT ILLNESS:  Mr. Pheasant is a pleasant 65 year old  Caucasian male who was admitted to [facility] on  November 26, 2008, with complaints of weakness and mild lightheadedness,  and was found to have atrial fibrillation with rapid ventricular  response.  Mr. Piercefield has a history of coronary artery disease status  post coronary bypass grafting surgery in 1995, chronic kidney disease,  chronic anemia secondary to underlying kidney disease, type 2 diabetes  mellitus, hypertension, peripheral neuropathy, and post-traumatic stress  disorder.  He undergoes monthly Aranesp injections for his anemia.  He  was in the cancer center last week being evaluated for his anemia when  he was found to have a heart rate of 155 with atrial fibrillation.  He  was taken to the emergency department where he was subsequently treated  with intravenous Cardizem which improved his ventricular rate control.  The patient was seen in the hospital by Dr. Tonny Bollman who  recommended that the patient be converted to p.o. diltiazem.  Prior to  discharge, the patient was also started on Toprol-XL 75 mg once a day.  Oral anticoagulation was with Coumadin was also started.  At the time of  discharge, the patient was only complaining of his mild baseline fatigue  and weakness.   He was scheduled to follow up with Dr. Sharrell Ku in our office today;  however, the patient cannot make his appointment this morning.  He was  put into my clinic.  The patient also needs to have his INR checked  today.  Mr. Hipple tells me that since  he was discharged from the  hospital 4 days ago, he has continued to have weakness and fatigue and  does not feel like doing anything.  It is difficult to tell if this is  his baseline or not.  He tells me that he feels slightly weaker than he  does at baseline.  He denies having any chest pain, shortness of breath,  awareness of palpitations, dizziness, lightheadedness, near syncope, or  syncope.  He is able to breathe normally while lying flat in bed.  He  does note mild lower extremity edema.  His only other complaint today is  that he apparently rolled out of his bed on Friday night.  This was not  provoked by any confusion or dizziness.  He tells me that he fell and  hit his abdomen on his bedside table.  He sustained no other trauma.  Later that day, he was apparently walking and tripped and fell.  He  sustained abrasion on his left lower leg with that fall.  This fall was  also not preceded by any dizziness, confusion, or loss of consciousness.  PAST MEDICAL HISTORY:  1. Coronary artery disease status post coronary artery bypass grafting      surgery in 1995.  2. Chronic kidney disease stage III.  3. Chronic anemia secondary to underlying kidney disease.  4. Type 2 diabetes mellitus.  5. History of nephrolithiasis.  6. Essential hypertension.  7. Peripheral neuropathy.  8. Post-traumatic stress disorder.  9. Hernia repair.  10.Rotator cuff repair.  11.Atrial fibrillation/atrial flutter with recent recurrence, November 26, 2008, at which time the patient was admitted to [facility].  The patient notes having had      postoperative atrial fibrillation following his coronary bypass      surgery in the mid 1990s.  He is otherwise not been known to have      atrial fibrillation over the last several years.   ALLERGIES:  No known drug allergies.   CURRENT MEDICATIONS:  1. Venlafaxine extended release 150 mg 2 tablets once daily.  2. Prazosin 2 mg  once daily.  3. Omeprazole 20 mg once daily.  4. Niacin extended release 1 g once daily.  5. Methadone 10 mg 3 times daily.  6. Lisinopril 20 mg once daily.  7. Clonazepam 1 mg in the morning, 2 mg at night.  8. Bupropion extended release 150 mg twice daily.  9. Actos 15 mg once daily.  10.Colace 100 mg 2 tablets once daily.  11.Zocor 20 mg at night.  12.Toprol-XL 75 mg once daily.  13.Diltiazem extended release 240 mg once daily.  14.Coumadin as directed.  15.Aspirin 81 mg once daily.   FAMILY HISTORY:  There is no history of coronary artery disease in the  patient's first-degree relatives.   SOCIAL HISTORY:  The patient is well aware, he leads sedentary  lifestyle.  He has a remote history of tobacco use for 25 years, but has  not smoked in many years.  He denies use of alcohol or illicit drugs.  He is not currently employed and is on disability.  The patient is a  [place] veteran.   REVIEW OF SYSTEMS:  Pertinent positives include chronic fatigue and  weakness that has been slightly worsened over the last week.  Otherwise,  the review of systems is as noted above and is negative.   PHYSICAL EXAMINATION:  VITAL SIGNS:  Blood pressure 108/54, pulse 105-  115, respiratory rate 12, and nonlabored.  GENERAL:  He is a pleasant middle-aged Caucasian male with flat affect  and poor hearing.  He is alert and oriented x3.  He is in no acute  distress.  SKIN:  Warm and dry.  HEENT:  Normal.  NEUROLOGICAL:  No focal neurological deficits.  PSYCHIATRIC:  Affect is flat.  Mood is somewhat depressed.  MUSCULOSKELETAL:  Muscle strength and tone is normal.  NECK:  No JVD.  No carotid bruits.  No thyromegaly.  No lymphadenopathy.  LUNGS:  Clear to auscultation bilaterally without wheezes, rhonchi, or  crackles noted.  CARDIOVASCULAR:  Irregular irregular with no murmurs, gallops, or rubs  noted.  ABDOMEN:  Soft, nontender, nondistended.  Bowel sounds are present.  EXTREMITIES:  There is  trace bilateral lower extremity edema.  There is  an abrasion noted over the left ankle with no signs of erythema.  This  appears to be well healing.   DIAGNOSTIC STUDIES:  1. A 12-lead EKG obtained in our office today shows atrial flutter      with variable block.  The rate on this EKG is 112 beats per minute.      There are nonspecific ST-segment changes noted.  This EKG is      compared to one dated November 26, 2008, which also shows atrial      flutter.  The baseline ST-segment changes appear to be unchanged.  2. Echocardiogram obtained on November 27, 2008, at [facility] while the patient was in atrial fibrillation with a rapid      rate in the 140s.  Left ventricular systolic function was preserved      with an the EF of 55%.  There were no gross wall motion      abnormalities noted, but the study was not adequate for definitive      evaluation of wall motion.  The left ventricle was normal in size.      Left ventricular wall thickness was normal.  The left atrium was      mildly dilated.  The right atrium was mildly dilated.  The aortic      valve was mildly calcified.  No significant valvular abnormalities      were noted.   ASSESSMENT AND PLAN:  This a pleasant 65 year old Caucasian male with a  recent diagnosis of atrial fibrillation/atrial flutter for which she was  hospitalized and started on rate control medications who also has a  history of coronary artery disease status post CABG, chronic kidney  disease, chronic anemia, diabetes mellitus, and hypertension.  He  presents today to establish Cardiology care and to have his INR checked  since he was started on Coumadin therapy last week.  It is difficult in  assessing this patient to tell if he is significantly different from his  baseline fatigue and weakness.  The patient tells me there is not a  significant difference and how he normally feels other than just slight  increase in his overall fatigue  level.  The patient has tolerated his  medications well, but continues to have atrial flutter with a heart rate  in the low 100.  It may be that he would feel significantly better if he  were in sinus rhythm.  I think that he may benefit from cardioversion  down the road.  His INR is subtherapeutic today at 1.3.  We will adjust  his Coumadin appropriately today and will also increase his Toprol-XL to  75 mg twice daily.  If we are unable to adequately control his rate with  rate control agents, then he will most definitely need to be  cardioverted.  Once again, it is difficult for me to tell if his  weakness and fatigue is at his baseline or if it is worsened secondary  to his atrial arrhythmia.  The patient was initially scheduled to see  Dr. Sharrell Ku today.  I am going to have him come back and see Dr.  Ladona Ridgel next Monday and see if Dr. Ladona Ridgel has any other suggestions at  this time in regards to his atrial arrhythmia.  I think the patient will  be best served to follow in the clinic of Dr. Ladona Ridgel.  The patient is  aware that he should alert our office if he has any change in his  clinical status, or present to his nearest emergency department.     Verne Carrow, MD  Electronically Signed    CM/MedQ  DD: 12/02/2008  DT: 12/03/2008  Job #: [id number]

## 2011-04-13 NOTE — Op Note (Signed)
Michael Lucas, Michael Lucas              ACCOUNT NO.:  [id number]   MEDICAL RECORD NO.:  [id number]          PATIENT TYPE:  INP   LOCATION:  2104                         FACILITY:  [facility]   PHYSICIAN:  Duke Salvia, MD, FACCDATE OF BIRTH:  1946/11/03   DATE OF PROCEDURE:  DATE OF DISCHARGE:                               OPERATIVE REPORT   PREOPERATIVE DIAGNOSIS:  Atrial flutter - recurrent.   POSTOPERATIVE DIAGNOSIS:  Atrial flutter - recurrent.   PROCEDURE:  Invasive electrophysiological study, arrhythmia mapping  through the substrate mapping and catheter ablation.   Following obtaining informed consent, the patient was brought to the  electrophysiologic laboratory and placed on the fluoroscopic table in  the supine position.  The patient was intubated under the care of  Anesthesia and Dr. Arta Bruce.   After routine prep and drape, catheterization was then performed with  local anesthesia.  Following the procedure, the catheters were removed.  Hemostasis was obtained, and the patient was transferred to the PACU in  stable condition.   Catheters of 5-French quadripolar catheter was inserted via the left  femoral vein to the AV junction measuring his electrogram.   A 6-French octapolar catheter was inserted via the right femoral vein  under coronary sinus.   A 7-French dual-decapolar catheter was inserted via the left femoral  vein to the tricuspid annulus.   An 8-French 8-mm deflectable tip catheter was inserted via the SL2  sheath in the right femoral vein to mapping sites in the posterior  septal space.   Surface leads I, aVF, and V1 were monitored continuously throughout the  procedure.  Following insertion of the catheters, a stimulation protocol  included  Incremental atrial pacing.  Incremental ventricular pacing.  Single and double atrial extrastimuli at paced cycle length of 600  milliseconds.   END-TIDAL RESULTS:  End-tidal surface electrocardiogram at basic  intervals.  Initial:  Rhythm:  Sinus; RR interval 744 milliseconds; PR interval  147  milliseconds; QRS duration  90 milliseconds; QT interval  402  milliseconds; bundle-branch block:  Absent; pre-excitation:  Absent.  AH interval  88 milliseconds; HV interval 60 milliseconds.   Final:  Rhythm:  Sinus; RR interval 848 milliseconds; PR interval 151  milliseconds; QRS duration  88 milliseconds; QT interval 454  milliseconds; P-wave duration 116 milliseconds; bundle-branch block:  Absent; pre-excitation:  Absent.  AV nodal function:  AV Wenckebach was 380  milliseconds.  VA Wenckebach was 450  milliseconds.  AV nodal effective refractory period at 600 milliseconds with couplet of  interval of 350 milliseconds with 320 milliseconds.  AV nodal conduction was continuous.   Accessory Pathway Function:  No evidence of accessory pathway was  identified.   Ventricular response to programmed stimulation was normal for  ventricular stimulation as described.   Arrhythmias induced; the patient had had recurrent typical atrial  flutter.  Because of a cavotricuspid, isthmus ablation was undertaken  empirically.  Following application of RF across the isthmus in 2  separate lines, one at [facility] clock and one at [facility] clock, a bidirectional  cavotricuspid isthmus conduction block was demonstrated with an A1  and  A2 of approximately 149 milliseconds.   Radiofrequency energy, a total of 17 applications were applied for a  total time of 18 minutes and 45 seconds.   Fluoroscopy time, a total of 13 minutes and 70 seconds.  The fluoroscopy  time was utilized at 7.5 frames per second.   IMPRESSION:  1. Normal sinus function.  2. Abnormal atrial function manifested by recurrent sustained atrial      flutter.  3. Normal atrioventricular nodal function.  4. Normal His-Purkinje system function.  5. No accessory pathway.  6. Normal ventricular response to programmed stimulation.   SUMMARY:  In  conclusion, the results of electrophysiological study  demonstrated cavotricuspid isthmus conduction, which was successfully  eliminated using RF energy resulted in bidirectional block and an A1 and  A2 interval of 150 milliseconds.  A separate line was placed because  there was recurrence after the first line notwithstanding the fact that  I could not find either with normal catheter positioning or retroflexed  catheter positioning and atrial signal identifying the source of  transisthmus conduction.  Following the second application at a level  more laterally at approximately 6 o'clock, bidirectional conduction  block was noted and persisted for greater than 30 minutes.   The patient tolerated the procedure without apparent complication.   The patient's heparin was discontinued at the very start of the  procedure and in an hour and a half when the procedure over was resumed.  Sheaths were removed in that at the same time that the heparin was  resumed.     Duke Salvia, MD, [facility]  Electronically Signed    SCK/MEDQ  D:  01/17/2009  T:  01/18/2009  Job:  [id number]   cc:   Julieanne Manson

## 2011-04-13 NOTE — Cardiovascular Report (Signed)
[name], [name]              ACCOUNT NO.:  [id number]   MEDICAL RECORD NO.:  [id number]           PATIENT TYPE:   LOCATION:                                 FACILITY:   PHYSICIAN:  Madolyn Frieze. Jens Som, MD, FACCDATE OF BIRTH:  12-18-1945   DATE OF PROCEDURE:  01/14/2009  DATE OF DISCHARGE:                            CARDIAC CATHETERIZATION   Michael Lucas is for cardioversion of atrial flutter.  A transesophageal  echocardiogram was performed prior to the procedure that showed no left  atrial appendage thrombus.  During that, he was given a total of 200 mcg  of fentanyl and 16 mg of Versed intravenously.  This was in addition to  his continuous infusion for ventilator sedation.   Synchronized cardioversion with 120 joules (biphasic) resulted in normal  sinus rhythm.  There were no immediate complications.  We would  recommend continuing heparin and he will need to be placed on Coumadin  and heparin continued until his INR is therapeutic.      Madolyn Frieze Jens Som, MD, [facility]  Electronically Signed     BSC/MEDQ  D:  01/14/2009  T:  01/14/2009  Job:  [id number]

## 2011-04-13 NOTE — Letter (Signed)
February 17, 2009    Dr. Franne Forts  [facility]  [address], [address]  [place], [place]  [address]   RE:  JOSHUAH, MINELLA  MRN:  [id number]  /  DOB:  02/06/1946   Dear Louanne Skye:   I hope this letter finds you well and I wish you and your family a  wonderful time at [place].   Mr. Cragle was seen following catheter ablation of his atrial flutter  accomplished in mid February.  He has had no recurrent tachy  palpitations nor tachycardia as he is sought to detect them by  palpation.  At this point we are going to stop his Coumadin.   He asked regarding steroid injection of his cervical spine to be done at  the [place], I think today.  I suggested that he wait until his INR has  normalized.   We will have him come back to see Dr. Clifton James in followup in about 4  months' time.   He will continue on his other medications including Prevacid,  omeprazole, niacin, methadone, lisinopril, clonazepam, bupropion,  simvastatin, aspirin 81, venlafaxine, insulin, and Toprol.   PHYSICAL EXAMINATION:  VITAL SIGNS:  His blood pressure today was 112/60  with the pulse of 73.  His weight was 184.  LUNGS:  Clear.  HEART:  The heart sounds were regular.  ABDOMEN:  Soft.  EXTREMITIES:  No edema.   Electrocardiogram dated today demonstrated sinus rhythm at 73 with  interval of 0.16/109/0.37.  The axis was 75 degrees.  The  electrocardiogram was otherwise normal.   IMPRESSION:  1. Atrial flutter status post ablation.  2. Coronary artery disease with prior bypass surgery.  3. History of congestive heart failure with an ejection fraction of      55%.  4. Pneumonia - severe per the patient's family's recollection.   Mr. Cutter, Passey, is doing well from an arrhythmia point-of-view.  We  will stop his Coumadin, will have him followup with Verne Carrow  in a number of months.   He was started on insulin at hospitalization.  Apparently, this has been  associated with  significant improvement in his ambient blood sugars.  I  suggest that he call you and get up with you in short order to establish  the appropriate followup for his insulin.  In addition, I have asked him  to follow up with you regarding followup of pneumonia and told him that  if you felt the need for pulmonary assistance, then you would refer him  there.    Sincerely,      Duke Salvia, MD, [facility] [place]  Electronically Signed    SCK/MedQ  DD: 02/17/2009  DT: 02/18/2009  Job #: [id number]

## 2011-04-13 NOTE — Discharge Summary (Signed)
Michael Lucas, Michael Lucas NO.:  [id number]   MEDICAL RECORD NO.:  [id number]          PATIENT TYPE:  INP   LOCATION:  2031                         FACILITY:  [facility]   PHYSICIAN:  Marca Ancona, MD      DATE OF BIRTH:  [birth year]   DATE OF ADMISSION:  01/09/2009  DATE OF DISCHARGE:  01/26/2009                               DISCHARGE SUMMARY   DISCHARGING PHYSICIAN:  Marca Ancona, MD   PRIMARY CARDIOLOGIST:  Marca Ancona, MD   EP:  Duke Salvia, MD, [facility]   DISCHARGING MEDICATIONS:  1. Toprol-XL 125 mg daily.  2. Prazosin 5 mg in the a.m., 10 mg at bedtime.  3. Coumadin 5 mg daily.  4. Wellbutrin SR 150 mg b.i.d.  5. Colace b.i.d.  6. Senokot b.i.d.  7. Prilosec b.i.d.  8. Multivitamin daily.  9. Effexor XR 75 mg daily.  10.Actos daily.  11.Aspirin 81 mg daily.  12.Klonopin as previously prescribed.  13.Zocor 20 mg daily.  14.Niaspan ER 1000 mg at bedtime.  15.Methadone 10 mg t.i.d. or as previously prescribed.  16.Simethicone 80 mg before meals and at bedtime.  17.Lantus insulin 24 units at bedtime.  18.NovoLog sliding scale insulin as previously prescribed.  19.Testosterone as previously prescribed.   PROCEDURES THIS ADMISSION:  1. Transesophageal echocardiogram with direct current cardioversion on      January 14, 2009, by Dr. Olga Millers, synchronized      cardioversion with 120 joules resulted in normal sinus rhythm.  2. Invasive electrophysiology study for arrhythmia mapping for      recurrent atrial flutter on January 17, 2009, by Dr. Berton Mount.      The patient underwent catheter ablation of atrial arrhythmia.   DISCHARGE DIAGNOSES:  1. Atrial flutter with rapid ventricular response status post ablation      and cardioversion as noted above.  2. Congestive heart failure, acute exacerbation most likely secondary      to atrial flutter with rapid ventricular response.  3. Anticoagulation therapy with an INR therapeutic at 2.3  at the  time      of discharge.  The patient will need 2 more weeks of warfarin      therapy for a total of 3 weeks post ablation.  4. Deconditioning.  The patient being discharged home with      prescription for rolling walker to be obtained from a PA, also set      up for home health PT and OT.  5. Initial diagnosis of respiratory distress, ventilator dependent      respiratory failure in the setting of atrial flutter with rapid      ventricular response and congestive heart failure.  6. Recent admission for treatment of pneumonia.  The patient extubated      successfully on January 15, 2009.  7. Chronic kidney disease, at the time of discharge creatinine 1.6.  8. Hypertension.  9. Diabetes.  10.Peripheral vascular disease.  11.Post-traumatic stress disorder resultant from [place] War.  The      patient on medications for this.  12.Coronary artery disease status post coronary artery bypass  graft in      1995.  13.Chronic anemia secondary to underlying kidney disease.  14.History of nephrolithiasis.  15.Peripheral neuropathy.  16.History of atrial fibrillation, atrial flutter.  17.The patient treated for pneumonia, hospital-acquired not been later      associated, initially at [facility] and then      here at [facility] through Critical Care Medicine.  The patient on      azithromycin, Zosyn, and Cipro, transition to Zosyn and Primaxin.   HOSPITAL COURSE:  Michael Lucas is a 65 year old Caucasian gentleman with  past medical history as stated above who initially presented to [facility] complaining of respiratory distress ultimately  requiring intubation at the family's request.  The patient was  transferred to [facility] for further management.  The patient initially  admitted under care of Critical Care Medicine who managed the patient's  respiratory status and pneumonia.  We became involved for the patient's  history of atrial flutter.  The  patient underwent transesophageal  echocardiogram with direct current cardioversion on January 14, 2009,  by Dr. Jens Som and then underwent atrial flutter ablation on January 17, 2009, by Dr. Berton Mount.  The patient with lengthy hospitalization  secondary to respiratory status, confusion, acute-on-chronic renal  failure, pneumonia, and failure to thrive with deconditioning.  Dr.  Shirlee Latch has followed the patient.  The patient currently is on telemetry  unit.  He is therapeutic with an INR of 2.3.  He has been afebrile, has  maintained sinus rhythm with probable sinus tachycardia noted on  monitor.  He is up with the assistance of PT and OT, will be discharged  home with home health assistance and a rolling walker.  The patient will  require 2 more weeks of warfarin therapy for a total of 3 weeks post  ablation.  At the time of discharge, he is afebrile, blood pressure at  103 to 145 over 61 to 70, sat 95% on room air.   Medications at time of discharge as listed above.  The patient has  prescriptions for the Toprol and prazosin, unclear if this is a new dose  on the prazosin or not, but I went ahead  and wrote him a prescription  for it.  Schedule the patient to follow up in the Coumadin Clinic on  January 31, 2009, at 11 a.m.  He will continue 5 mg of Coumadin  starting today until followup appointment and then he is scheduled to  follow up with Dr. Graciela Husbands on February 17, 2009, at 1:30.   Duration of discharge encounter is well over 30 minutes.      Dorian Pod, ACNP      Marca Ancona, MD  Electronically Signed    MB/MEDQ  D:  01/26/2009  T:  01/26/2009  Job:  [id number]

## 2011-04-13 NOTE — Progress Notes (Signed)
[facility] OFFICE NOTE   RAEF, SPRIGG                       MRN:          [id number]  DATE:12/09/2008                            DOB:          15-Mar-1946    [name] returns today for followup.  He is a very pleasant 65-year-  old man, who has history of known coronary artery disease, status post  LIMA to the LAD.  He has a history of diabetes, hypertension, peripheral  vascular disease, and dyslipidemia.  He subsequently developed atrial  flutter, though was initially called AFib and was hospitalized back in  December.  He was rate controlled.  He returns today for followup.  The  patient does not feel much in the way of palpitations, but does have  some increasing dyspnea with his flutter.  He has been placed on  Coumadin for thromboembolic prevention.  He was referred today for  additional evaluation and consideration for catheter ablation.  The  patient does also have a history of chronic anemia and is on Aranesp  injections.  The patient's other complaint is that of peripheral edema  and some chronic venous insufficiency.  Additional past medical history  is notable for peripheral neuropathy.  He has got a history of PTSD,  hernia repair in the past, he has a history of diabetes.   MEDICATIONS:  1. Venlafaxine 150 mg 2 tablets daily.  2. Prazosin 2 mg daily.  3. Omeprazole 20 a day.  4. Niacin extended release 1 g daily.  5. Methadone 10 mg 3 times daily.  6. Lisinopril 20 a day.  7. Clonazepam 1 mg daily and 2 at night.  8. Bupropion extended release 150 twice a day.  9. Actos 15 a day.  10.Colace 100 mg 2 tablets daily.  11.Zocor 20 a day.  12.Toprol-XL 75 daily.  13.Diltiazem 240 daily.  14.Coumadin as directed.  15.Aspirin 81 a day.   FAMILY HISTORY:  Negative for premature coronary artery disease.   SOCIAL HISTORY:  The patient is married.  He lives a sedentary life.  He  has a remote tobacco use, but has not smoke cigarettes for many many  years.  He denies alcohol abuse.  He is disabled.   REVIEW OF SYSTEMS:  Notable for generalized fatigue and weakness, worse  with an atrial flutter.  He has some problems with peripheral edema and  the swelling.  He had been no pain in his lower extremities.  Otherwise,  his systems reviewed are negative except as noted in the HPI.   PHYSICAL EXAMINATION:  GENERAL:  He is a pleasant well-appearing middle-  aged man in no distress.  VITAL SIGNS:  Blood pressure today with 131/82, the pulse was 100 and  regular, respirations were 18, and the weight was 211 pounds.  HEENT:  Normocephalic, atraumatic.  Pupils are equal and round.  Oropharynx is moist.  Sclerae anicteric.  NECK:  No jugular venous distention.  There is no thyromegaly.  Trachea  was midline.  Carotids are 2+ and symmetric.  LUNGS:  Clear bilaterally to auscultation.  No wheezes, rales, or  rhonchi are present.  There are no increased work of breathing.  CARDIOVASCULAR:  Regular tachycardia with normal S1 and S2.  I am not  appreciated an S3 gallop.  The PMI was not enlarged, nor was it  laterally displaced.  ABDOMEN:  Soft and nontender.  There is no organomegaly.  Bowel sounds  are present.  There is no rebound or guarding.  EXTREMITIES:  Trace  peripheral edema on the right and 1+ peripheral edema on the left with  venous insufficiency changes.  There is a healing ulcer on his left leg  as well.   IMPRESSION:  1. New-onset atrial flutter.  2. Chronic Coumadin therapy secondary to new-onset atrial flutter .  3. Coronary artery disease, status post bypass surgery, presently      stable.   DISCUSSION:  Today, I have discussed treatment options with [name]  in detail.  The risks, benefits, goals, and expectations of  electrophysiologic study and catheter ablation were discussed with the  patient his wife who is with him today.  Once his INR has  been  therapeutic for 3 weeks, then we can plan on proceeding with catheter  ablation.  He may well be a candidate for our new dual flare catheter  protocol.     Michael Lucas. Ladona Ridgel, MD  Electronically Signed    GWT/MedQ  DD: 12/09/2008  DT: 12/10/2008  Job #: [id number]

## 2011-04-16 NOTE — Cardiovascular Report (Signed)
[place]  Patient:    Michael Lucas, Michael Lucas                       MRN: [id number] Proc. Date: 05/15/01 Adm. Date:  [id number] Attending:  Lewayne Bunting CC:         Franne Forts, M.D.  [facility]  Cardiopulmonary Lab   Cardiac Catheterization  CLINICAL HISTORY:  [name] is 65 years old and had bypass surgery in 1995. He also has spinal stenosis of the cervical spine.   He recently has been having chest and arm pain, and he was not sure if it was related to the spinal stenosis or coronary disease.  He had a Cardiolite scan which was borderline. He subsequently had increased pain and was admitted Friday and scheduled for catheterization today.  PROCEDURE:   Cardiac catheterization.  CARDIOLOGISTEverardo Beals Juanda Chance, M.D. [facility]  PROCEDURE IN DETAIL:  The procedure was performed via the right femoral artery using arterial sheath and 6-French preformed coronary catheters.  A frontal arterial puncture was performed, and Omnipaque contrast was used.  LIMA catheter was used for injection of LIMA graft.  Distal aortogram was performed to rule out abdominal aortic aneurysm.  RESULTS:  Left main coronary artery was free of significant disease.  Left anterior descending artery was completely occluded at its origin.  The circumflex artery gave rise to a marginal branch and a small A-V branch. There was 30% narrowing in the proximal circumflex artery and 40% narrowing in the marginal branch.  The right coronary artery was a large dominant vessel that gave rise to a right ventricular branch, a posterior descending branch, and a posterolateral branch.  There was 50% narrowing in the proximal right coronary artery.  The LIMA graft to the LAD was patent and functioned normally.  The LAD was free of significant disease and terminated before the apex.  The subclavian injection shows 70% narrowing of the vertebral artery.  The left ventriculogram performed in  the RAO projection showed good wall motion with no areas of hypokinesis.  The estimated ejection fraction was 60%.  The distal aortogram was performed which showed 40% narrowing in both renal arteries.  There was irregularity in the aorta but no major obstruction.  The aortic pressure was 115/61 with mean of 83.  Left ventricular pressure was 115/9.  CONCLUSION:  Coronary artery disease status post coronary artery bypass graft surgery in 1985 with total occlusion of native left anterior descending artery, a 30% proximal stenosis in the circumflex artery with 40% narrowing in a marginal branch, 50% narrowing in the proximal right coronary artery, a patent left internal mammary artery graft to the left anterior descending artery, and normal left ventricular function.  RECOMMENDATIONS:  There is no source of ischemia.  In review of these findings, the patients symptoms are probably due to a spinal stenosis.  We will plan reassurance and continued secondary prevention for his coronary heart disease. DD:  05/15/01 TD:  05/15/01 Job: [id number]

## 2011-04-16 NOTE — Discharge Summary (Signed)
[facility] [place]  Patient:    Michael Lucas, Michael Lucas                       MRN: [id number] Adm. Date:  [id number] Disc. Date: [id number] Attending:  Lewayne Bunting Dictator:   Brita Romp, P.A. CC:         Richard L. Sullivan Lone, M.D., [address],  [address], [place], [place] [address]  Julio Sicks, M.D.  Dietrich Pates, M.D. [facility]   Discharge Summary  DISCHARGE DIAGNOSES: 1. Chest pain, status post cardiac catheterization. 2. Coronary artery disease, status post bypass surgery. 3. Benign prostatic hypertrophy. 4. Non-insulin-dependent diabetes mellitus. 5. History of nephrolithiasis. 6. Hypertension. 7. Hyperlipidemia. 8. Paroxysmal atrial fibrillation. 9. Depression.  HOSPITAL COURSE:  The patient presented to the emergency room on May 12, 2001, reporting chronic neck and arm pain.  He was seen and admitted by Doylene Canning. Ladona Ridgel, M.D.  Dr. Ladona Ridgel noted that the patient has a long history of spinal stenosis with chronic neck and back pain for which he has received multiple steroid injections.  Over the two weeks prior to admission, he had had non-exertional chest pain with some shortness of breath, however, with no nausea or vomiting.  Doylene Canning. Ladona Ridgel, M.D., further noted that the patient was to undergo surgical evaluation for possible treatment of spinal stenosis and decided that the best course of action would be to have the patient undergo a cardiac catheterization.  The next day, the patient was seen by Theron Arista C. Eden Emms, M.D.  The patient denied any chest pain or shortness of breath, however, he reported increasing neck pain.  As a result, Peter C. Eden Emms, M.D., ordered an adjustment in his pain medications.  The following morning, Peter C. Eden Emms, M.D., again saw the patient.  He noted that the patient reported improved pain control with the change in medications.  The next day, the patient was taken to the catheterization laboratory by Bruce R.  Juanda Chance, M.D.  He noted that the native LAD was totally occluded.  The circumflex had a 30% proximal stenosis along with a 40% narrowing in the marginal branch.  There was also a 50% narrowing in the proximal right coronary artery.  The LIMA to LAD graft was patent.  There was normal left ventricular function.  Bruce Elvera Lennox Juanda Chance, M.D., felt that there was no source for ischemia.  He also felt that the patients symptomatology was probably secondary to the spinal stenosis.  Following catheterization, the patient was seen by Dietrich Pates, M.D.  She felt that the patient was stable for discharge.  DISCHARGE MEDICATIONS:  1. Provigil 200 mg q.d.  2. Effexor XR 150 mg q.d.  3. Celebrex 200 mg q.d.  4. Simvastatin 40 mg q.d.  5. Niacin 1000 mg q.d.  6. Atenolol 100 mg q.d.  7. Metformin 1000 mg b.i.d.  8. Lisinopril 20 mg q.d.  9. Aricept 5 mg q.d. 10. Morphine sulfate 30 mg t.i.d. 11. Enteric-coated aspirin 81 mg q.d.  ACTIVITY:  The patient was advised to avoid driving, heavy lifting, or tub baths for two days.  DIET:  He was instructed to follow a low-fat diet.  WOUND CARE:  He was advised to watch the catheterization site for any pain, bleeding, or swelling and to call the [place] office for any of these problems.  FOLLOW-UP: 1. He is to follow up with Julio Sicks, M.D., in [place], [place], on    June 23, 2001, at 1 p.m.  2. He is to follow up with Richard L. Sullivan Lone, M.D., as needed for scheduled. 3. He is to follow up with Dietrich Pates, M.D., as needed for scheduled.  LABORATORY VALUES:  White count 8.0, hemoglobin 10.9, hematocrit 31.5, platelets 276.  Sodium 132, potassium 4.2, chloride 100, CO2 24, BUN 19, creatinine 1.2, glucose 170, calcium 8.8, total protein 6.4, albumin 3.7, AST 21, ALT 20, alkaline phosphatase 60, total bilirubin 0.5.  Serial cardiac enzymes were negative for MI.  The chest x-ray revealed prior median sternotomy and coronary artery  bypass grafting with no active disease.  The electrocardiogram revealed a normal sinus rhythm at 78, PR interval 0.142, QRS 0.088, QTC 0.399, and axis 70. DD:  06/22/01 TD:  06/23/01 Job: [id number]

## 2011-04-16 NOTE — Assessment & Plan Note (Signed)
[facility]                                 ON-CALL NOTE   PARDEEP, PAUTZ                       MRN:          [id number]  DATE:11/30/2007                            DOB:          1946/06/11    He is the patient of Dr. Tonny Bollman.   I received a call from Mr. Rossbach this evening stating that he was  started on Coumadin yesterday, took his first dose last night.  Per  patient's report, he does have a history of gait imbalance and fell  twice last night while walking in his bedroom.  He has had some bruising  and a small cut, which is not currently bleeding.  He is worried about  what impact Coumadin may have on his current injuries.  I advised that  it take several days for his Coumadin level to become therapeutic.  He  likely should not experience any adverse reactions taking his Coumadin  tonight.  I also advised the bigger issue with his unstable gait and  falls and recommended that he have an open conversation with Dr. Excell Seltzer  with regards to this, as it may become a limiting issue.  The patient  was grateful for the callback.      Nicolasa Ducking, ANP  Electronically Signed      Veverly Fells. Excell Seltzer, MD  Electronically Signed   CB/MedQ  DD: 11/29/2008  DT: 11/29/2008  Job #: [id number]

## 2011-04-30 ENCOUNTER — Ambulatory Visit: Payer: Medicare Other | Admitting: Internal Medicine

## 2011-05-18 ENCOUNTER — Encounter: Payer: Self-pay | Admitting: Cardiology

## 2011-06-15 ENCOUNTER — Ambulatory Visit: Payer: Medicare Other | Admitting: Internal Medicine

## 2011-06-30 ENCOUNTER — Ambulatory Visit: Payer: Medicare Other | Admitting: Internal Medicine

## 2011-07-31 ENCOUNTER — Ambulatory Visit: Payer: Medicare Other | Admitting: Internal Medicine

## 2011-08-05 ENCOUNTER — Encounter: Payer: Medicare Other | Admitting: Family Medicine

## 2011-08-10 ENCOUNTER — Ambulatory Visit: Payer: Medicare Other | Admitting: Internal Medicine

## 2011-08-30 ENCOUNTER — Ambulatory Visit: Payer: Medicare Other | Admitting: Internal Medicine

## 2011-08-30 ENCOUNTER — Encounter: Payer: Medicare Other | Admitting: Family Medicine

## 2011-09-21 ENCOUNTER — Ambulatory Visit: Payer: Medicare Other | Admitting: Internal Medicine

## 2011-09-30 ENCOUNTER — Encounter: Payer: Medicare Other | Admitting: Family Medicine

## 2011-09-30 ENCOUNTER — Ambulatory Visit: Payer: Medicare Other | Admitting: Internal Medicine

## 2011-11-02 ENCOUNTER — Ambulatory Visit (INDEPENDENT_AMBULATORY_CARE_PROVIDER_SITE_OTHER): Payer: Medicare Other | Admitting: *Deleted

## 2011-11-02 ENCOUNTER — Encounter: Payer: Self-pay | Admitting: Cardiology

## 2011-11-02 DIAGNOSIS — Z79899 Other long term (current) drug therapy: Secondary | ICD-10-CM

## 2011-11-02 DIAGNOSIS — I25810 Atherosclerosis of coronary artery bypass graft(s) without angina pectoris: Secondary | ICD-10-CM

## 2011-11-02 DIAGNOSIS — E7800 Pure hypercholesterolemia, unspecified: Secondary | ICD-10-CM

## 2011-11-02 DIAGNOSIS — R0602 Shortness of breath: Secondary | ICD-10-CM

## 2011-11-02 LAB — HEPATIC FUNCTION PANEL
AST: 25 U/L (ref 0–37)
Alkaline Phosphatase: 91 U/L (ref 39–117)
Bilirubin, Direct: 0.1 mg/dL (ref 0.0–0.3)
Indirect Bilirubin: 0.2 mg/dL (ref 0.0–0.9)
Total Bilirubin: 0.3 mg/dL (ref 0.3–1.2)

## 2011-11-02 LAB — LIPID PANEL
HDL: 51 mg/dL (ref >39)
LDL Cholesterol: 70 mg/dL (ref 0–99)
Total CHOL/HDL Ratio: 2.9 Ratio
Triglycerides: 137 mg/dL (ref <150)
VLDL: 27 mg/dL (ref 0–40)

## 2011-11-02 LAB — BASIC METABOLIC PANEL
BUN: 20 mg/dL (ref 6–23)
Chloride: 100 mEq/L (ref 96–112)
Potassium: 4.4 mEq/L (ref 3.5–5.3)

## 2011-11-03 ENCOUNTER — Ambulatory Visit (INDEPENDENT_AMBULATORY_CARE_PROVIDER_SITE_OTHER): Payer: Medicare Other | Admitting: Cardiology

## 2011-11-03 ENCOUNTER — Encounter: Payer: Self-pay | Admitting: Cardiology

## 2011-11-03 VITALS — BP 121/77 | HR 87 | Ht 68.0 in | Wt 201.0 lb

## 2011-11-03 DIAGNOSIS — I2510 Atherosclerotic heart disease of native coronary artery without angina pectoris: Secondary | ICD-10-CM

## 2011-11-03 DIAGNOSIS — I509 Heart failure, unspecified: Secondary | ICD-10-CM

## 2011-11-03 DIAGNOSIS — R0989 Other specified symptoms and signs involving the circulatory and respiratory systems: Secondary | ICD-10-CM

## 2011-11-03 DIAGNOSIS — I4891 Unspecified atrial fibrillation: Secondary | ICD-10-CM

## 2011-11-03 DIAGNOSIS — I25810 Atherosclerosis of coronary artery bypass graft(s) without angina pectoris: Secondary | ICD-10-CM

## 2011-11-03 DIAGNOSIS — I5032 Chronic diastolic (congestive) heart failure: Secondary | ICD-10-CM

## 2011-11-03 DIAGNOSIS — E785 Hyperlipidemia, unspecified: Secondary | ICD-10-CM

## 2011-11-03 NOTE — Patient Instructions (Signed)
Your physician has requested that you have a carotid duplex. This test is an ultrasound of the carotid arteries in your neck. It looks at blood flow through these arteries that supply the brain with blood. Allow one hour for this exam. There are no restrictions or special instructions.  Your physician recommends that you schedule a follow-up appointment in: 6 months  

## 2011-11-03 NOTE — Progress Notes (Signed)
Patient ID: Michael Lucas, male   DOB: 06-Oct-1946, 65 y.o.   MRN: [id number] PCP: Dr. Sullivan Lone  65 yo with history of CAD s/p CABG 1995, atrial fib/flutter s/p atrial flutter ablation 2/10, HTN, gait instability/peripheral neuropathy, and diastolic CHF returns for followup.  He has continued to have periodic falls thought to be due to peripheral neuropathy with imbalance.  This improved after he did PT, and he has had only 1 fall in the last month. He does not get lightheaded or pass out.  He is using a cane.  He also has anemia with HCT getting down to as low as 25 in 3/12.  He has been seeing a hematologist (Dr. Kerin Salen) and getting Procrit shots.  He is not sure what the underlying cause for his anemia is.  He has had GI workups and bone marrow biopsies in the past.  No chest pain.  Shortness of breath when walking up a flight of steps (stable).  He is not very active due to balance issues.  Blood glucose is running high in the 200s.    Labs (9/10): K 4.7, creatinine 1.93.  Labs (10/10): HDL 38, LDL 86 Labs (3/11): K 4.2, creatinine 1.7 Labs (8/11): K 4.3, creatinine 1.67, LDL 68, HDL 47 Labs (12/11): K 4.4, creatinine 1.4, HCT 36 Labs (3/12): HCT 25 Labs (5/12): hgb 13 Labs (12/12): K 4.4, creatinine 1.23, LDL 70, HDL 51, LFTs normal, BNP 7.9  ECG: NSR, nonspecific T wave flattening  Allergies (verified):  No Known Drug Allergies  Family History: Noncontributory.   Social History: Disabled, lives in [place].  Formerly was a Clinical research associate.  Tobacco Use - Former, quit 7/10.  Alcohol Use - no  Past Medical History: 1. Coronary artery disease status post coronary artery bypass grafting surgery in 1995. 2. Chronic kidney disease stage III. 3. Chronic anemia secondary to underlying kidney disease. 4. Type 2 diabetes mellitus. 5. History of nephrolithiasis. 6. Essential hypertension. 7. 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  Has led to gait instability.  Seen by [facility],  head MRI showed only mild diffuse atrophy with moderate peri-sylvan atrophy.  8. Post-traumatic stress disorder. Takes prazosin for this.  9. Hernia repair. 10. Rotator cuff repair. 11. Atrial fibrillation/atrial flutter with ablation of flutter 2/10. Holter monitor 9/10 showed predominantly NSR with some short runs of atrial fibrillation.  Coumadin stopped in 7/11 due to frequent falls.  12. Diastolic CHF: Last echo was in 9/10 showing EF 50-55%, mild LVH, grade I diastolic dysfunction, mild MR.  13. OSA: Unable to tolerate CPAP because of allergic reaction to straps of face mask.  14. Carotid dopplers (10/10): bilateral ICAs did not show significant disease. The right vertebral may have been occluded.  Has right carotid bruit.   ROS: All systems reviewed and negative except as per HPI.   Current Outpatient Prescriptions  Medication Sig Dispense Refill  . aspirin 325 MG tablet Take 325 mg by mouth daily.        . clonazePAM (KLONOPIN) 1 MG tablet Take 1 mg by mouth. 1 tab daily at noon, 2 tabs at bedtime       . docusate sodium (COLACE) 100 MG capsule Take 2 mg by mouth daily.        . Emollient (EUCERIN PLUS) 2.5-10 % CREA Apply 1 application topically daily as needed.        . ergocalciferol (VITAMIN D2) 50000 UNITS capsule Take 50,000 Units by mouth once a week.        Marland Kitchen  furosemide (LASIX) 40 MG tablet Take 40 mg by mouth daily.        Marland Kitchen gabapentin (NEURONTIN) 300 MG capsule Take 300 mg by mouth daily.        . insulin aspart (NOVOLOG) 100 UNIT/ML injection 10 units SQ before meals TID      . insulin glargine (LANTUS) 100 UNIT/ML injection Inject 26 Units into the skin at bedtime.       Marland Kitchen ketoconazole (NIZORAL) 2 % shampoo Apply 1 application topically daily. For head       . methadone (DOLOPHINE) 10 MG tablet Take 10 mg by mouth 3 (three) times daily.        . metoprolol (LOPRESSOR) 50 MG tablet Take 50 mg by mouth 2 (two) times daily.        . Niacin CR 1000 MG TBCR Take 1 tablet by  mouth daily.        . rosuvastatin (CRESTOR) 20 MG tablet Take 40 mg by mouth daily.       . tacrolimus (PROTOPIC) 0.03 % ointment Apply 1 application topically. Apply to face and neck 3 times a week        . tadalafil (CIALIS) 5 MG tablet Take 5 mg by mouth daily.        . Venlafaxine HCl 150 MG TB24 Take 2 tablets by mouth daily.          BP 121/77  Pulse 87  Ht 5\' 8"  (1.727 m)  Wt 91.173 kg (201 lb)  BMI 30.56 kg/m2 General:  Chronically ill-appearing Neck:  Neck supple, no JVD. No masses, thyromegaly or abnormal cervical nodes. Lungs:  Clear bilaterally to auscultation and percussion. Heart:  Non-displaced PMI, chest non-tender; regular rate and rhythm, S1, S2 without rubs or gallops. 1/6 crescendo-descrescendo murmur at RUSB.  Carotid upstroke normal, right carotid bruit. Trace ankle edema.  2+ PT pulses bilaterally.  Abdomen:  Bowel sounds positive; abdomen soft and non-tender without masses, organomegaly, or hernias noted. No hepatosplenomegaly. Extremities:  No clubbing or cyanosis. Neurologic:  Alert and oriented x 3. Psych:  Flat affect.

## 2011-11-05 DIAGNOSIS — R0989 Other specified symptoms and signs involving the circulatory and respiratory systems: Secondary | ICD-10-CM | POA: Insufficient documentation

## 2011-11-05 NOTE — Assessment & Plan Note (Signed)
Stable with no ischemic symptoms. Continue ASA, Toprol XL, Crestor.

## 2011-11-05 NOTE — Assessment & Plan Note (Signed)
Patient appears euvolemic today.  Continue current dose of Lasix.

## 2011-11-05 NOTE — Assessment & Plan Note (Signed)
Lipids at goal when checked this month (LDL = 70).

## 2011-11-05 NOTE — Assessment & Plan Note (Signed)
I will get repeat carotid dopplers.  Bruit sounds more prominent.

## 2011-11-05 NOTE — Assessment & Plan Note (Signed)
Patient has h/o atrial fibrillation.  On holter, he was noted to have short bursts of paroxysmal atrial fibrillation.  He is onToprol XL to control his rate.  He is at increased risk of stroke with CHADS2 score 2 (CHF, HTN).  However, he is having frequent falls (mechanical, seem to be due to gait difficulty from peripheral neuropathy).  He is off coumadin because of the falls. He is taking ASA 325 mg daily

## 2011-11-12 ENCOUNTER — Encounter: Payer: Medicare Other | Admitting: *Deleted

## 2011-12-01 ENCOUNTER — Ambulatory Visit: Payer: Self-pay | Admitting: Internal Medicine

## 2011-12-01 DIAGNOSIS — E1149 Type 2 diabetes mellitus with other diabetic neurological complication: Secondary | ICD-10-CM | POA: Diagnosis not present

## 2011-12-01 DIAGNOSIS — Z7982 Long term (current) use of aspirin: Secondary | ICD-10-CM | POA: Diagnosis not present

## 2011-12-01 DIAGNOSIS — R269 Unspecified abnormalities of gait and mobility: Secondary | ICD-10-CM | POA: Diagnosis not present

## 2011-12-01 DIAGNOSIS — I2510 Atherosclerotic heart disease of native coronary artery without angina pectoris: Secondary | ICD-10-CM | POA: Diagnosis not present

## 2011-12-01 DIAGNOSIS — K219 Gastro-esophageal reflux disease without esophagitis: Secondary | ICD-10-CM | POA: Diagnosis not present

## 2011-12-01 DIAGNOSIS — Z951 Presence of aortocoronary bypass graft: Secondary | ICD-10-CM | POA: Diagnosis not present

## 2011-12-01 DIAGNOSIS — M1990 Unspecified osteoarthritis, unspecified site: Secondary | ICD-10-CM | POA: Diagnosis not present

## 2011-12-01 DIAGNOSIS — N189 Chronic kidney disease, unspecified: Secondary | ICD-10-CM | POA: Diagnosis not present

## 2011-12-01 DIAGNOSIS — Z794 Long term (current) use of insulin: Secondary | ICD-10-CM | POA: Diagnosis not present

## 2011-12-01 DIAGNOSIS — E1142 Type 2 diabetes mellitus with diabetic polyneuropathy: Secondary | ICD-10-CM | POA: Diagnosis not present

## 2011-12-01 DIAGNOSIS — Z79899 Other long term (current) drug therapy: Secondary | ICD-10-CM | POA: Diagnosis not present

## 2011-12-01 DIAGNOSIS — E785 Hyperlipidemia, unspecified: Secondary | ICD-10-CM | POA: Diagnosis not present

## 2011-12-01 DIAGNOSIS — R413 Other amnesia: Secondary | ICD-10-CM | POA: Diagnosis not present

## 2011-12-01 DIAGNOSIS — N039 Chronic nephritic syndrome with unspecified morphologic changes: Secondary | ICD-10-CM | POA: Diagnosis not present

## 2011-12-01 DIAGNOSIS — I4891 Unspecified atrial fibrillation: Secondary | ICD-10-CM | POA: Diagnosis not present

## 2011-12-01 DIAGNOSIS — D631 Anemia in chronic kidney disease: Secondary | ICD-10-CM | POA: Diagnosis not present

## 2011-12-01 LAB — CBC CANCER CENTER
Basophil %: 0.8 %
Eosinophil #: 0.5 x10 3/mm (ref 0.0–0.7)
Eosinophil %: 6 %
HCT: 36.8 % — ABNORMAL LOW (ref 40.0–52.0)
HGB: 13.1 g/dL (ref 13.0–18.0)
Lymphocyte %: 38.2 %
MCH: 31.9 pg (ref 26.0–34.0)
MCV: 90 fL (ref 80–100)
Neutrophil #: 3.7 x10 3/mm (ref 1.4–6.5)
Neutrophil %: 44.8 %
RBC: 4.09 10*6/uL — ABNORMAL LOW (ref 4.40–5.90)
RDW: 12.4 % (ref 11.5–14.5)

## 2011-12-03 ENCOUNTER — Encounter (INDEPENDENT_AMBULATORY_CARE_PROVIDER_SITE_OTHER): Payer: Medicare Other | Admitting: *Deleted

## 2011-12-03 DIAGNOSIS — R0989 Other specified symptoms and signs involving the circulatory and respiratory systems: Secondary | ICD-10-CM

## 2011-12-03 DIAGNOSIS — I6529 Occlusion and stenosis of unspecified carotid artery: Secondary | ICD-10-CM

## 2011-12-31 ENCOUNTER — Ambulatory Visit: Payer: Self-pay | Admitting: Internal Medicine

## 2012-02-23 ENCOUNTER — Other Ambulatory Visit: Payer: Self-pay

## 2012-02-23 MED ORDER — METOPROLOL TARTRATE 50 MG PO TABS: 50.0000 mg | ORAL_TABLET | Freq: Two times a day (BID) | ORAL | Status: DC

## 2012-05-18 ENCOUNTER — Ambulatory Visit: Payer: Medicare Other | Admitting: Cardiovascular Disease

## 2012-05-22 ENCOUNTER — Ambulatory Visit: Payer: Self-pay | Admitting: Oncology

## 2012-05-25 ENCOUNTER — Ambulatory Visit: Payer: Medicare Other | Admitting: Cardiovascular Disease

## 2012-05-30 ENCOUNTER — Other Ambulatory Visit: Payer: Self-pay

## 2012-05-30 MED ORDER — METOPROLOL TARTRATE 50 MG PO TABS: 50.0000 mg | ORAL_TABLET | Freq: Two times a day (BID) | ORAL | Status: DC

## 2012-06-02 ENCOUNTER — Encounter: Payer: Self-pay | Admitting: Cardiovascular Disease

## 2012-06-02 ENCOUNTER — Ambulatory Visit (INDEPENDENT_AMBULATORY_CARE_PROVIDER_SITE_OTHER): Payer: Medicare Other | Admitting: Cardiovascular Disease

## 2012-06-02 VITALS — BP 107/67 | HR 81 | Ht 68.0 in | Wt 201.0 lb

## 2012-06-02 DIAGNOSIS — I951 Orthostatic hypotension: Secondary | ICD-10-CM

## 2012-06-02 DIAGNOSIS — R0989 Other specified symptoms and signs involving the circulatory and respiratory systems: Secondary | ICD-10-CM

## 2012-06-02 DIAGNOSIS — I5032 Chronic diastolic (congestive) heart failure: Secondary | ICD-10-CM

## 2012-06-02 DIAGNOSIS — I2510 Atherosclerotic heart disease of native coronary artery without angina pectoris: Secondary | ICD-10-CM | POA: Diagnosis not present

## 2012-06-02 DIAGNOSIS — I4891 Unspecified atrial fibrillation: Secondary | ICD-10-CM | POA: Diagnosis not present

## 2012-06-02 DIAGNOSIS — I25810 Atherosclerosis of coronary artery bypass graft(s) without angina pectoris: Secondary | ICD-10-CM | POA: Diagnosis not present

## 2012-06-02 NOTE — Assessment & Plan Note (Signed)
He is currently in normal sinus rhythm. I agree that the risks of anticoagulation outweigh the benefit at this time due to frequent falls, anemia and maintaining sinus rhythm.

## 2012-06-02 NOTE — Patient Instructions (Addendum)
Continue same medications.  Follow up in 6 months.  

## 2012-06-02 NOTE — Assessment & Plan Note (Signed)
Only mild disease bilaterally. 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 Clinically, his right radial pulse is normal and he has no symptoms of claudication or dizziness when he uses his right arm. Continue to observe.

## 2012-06-02 NOTE — Assessment & Plan Note (Signed)
The patient has symptoms of orthostatic hypotension which was confirmed today. His systolic blood pressure dropped from 133 to 110 without a change in heart rate. I discussed with him the importance of staying well hydrated and avoiding sudden standing up. He is on metoprolol and lisinopril. No changes in medications. I am hesitant to start Midrin or Florinef given his baseline hypertension and diastolic heart failure.

## 2012-06-02 NOTE — Assessment & Plan Note (Signed)
He appears to be euvolemic. 

## 2012-06-02 NOTE — Assessment & Plan Note (Signed)
He has no symptoms suggestive of angina at this time. Continue medical management.

## 2012-06-02 NOTE — Progress Notes (Signed)
HPI  66yo male with history of CAD s/p CABG 1995, atrial fib/flutter s/p atrial flutter ablation 2/10, HTN, gait instability/peripheral neuropathy, and diastolic CHF returns for followup. He used to see Dr. Shirlee Latch. He gets most of his care at the [facility] in [place]. He continues to have periodic falls thought to be due to peripheral neuropathy with imbalance but this has improved from before. He also has anemia with HCT getting down to as low as 25 in 3/12. He has been seeing a hematologist (Dr. Kerin Salen) and getting Procrit shots.  According to him he was noted to be hypotensive recently at the [facility]. He is on lisinopril unknown dose once daily. The dose was decreased by half and since then his hypotension resolved. He denies any chest pain. He has stable dyspnea. No lower extremity edema. He complains of dizziness when he changes position suddenly. No syncope.  No Known Allergies   Current Outpatient Prescriptions on File Prior to Visit  Medication Sig Dispense Refill  . aspirin 325 MG tablet Take 325 mg by mouth daily.        . clonazePAM (KLONOPIN) 1 MG tablet Take 1 mg by mouth. 1 tab daily at noon, 2 tabs at bedtime       . docusate sodium (COLACE) 100 MG capsule Take 100 mg by mouth 2 (two) times daily.       . Emollient (EUCERIN PLUS) 2.5-10 % CREA Apply 1 application topically daily as needed.        . ergocalciferol (VITAMIN D2) 50000 UNITS capsule Take 50,000 Units by mouth once a week.        . furosemide (LASIX) 40 MG tablet Take 40 mg by mouth daily.        . insulin aspart (NOVOLOG) 100 UNIT/ML injection 10 units SQ before meals TID      . insulin glargine (LANTUS) 100 UNIT/ML injection Inject 26 Units into the skin at bedtime.       Marland Kitchen ketoconazole (NIZORAL) 2 % shampoo Apply 1 application topically daily. For head       . methadone (DOLOPHINE) 10 MG tablet Take 10 mg by mouth 3 (three) times daily.        . metoprolol (LOPRESSOR) 50 MG tablet Take 1 tablet (50 mg  total) by mouth 2 (two) times daily.  60 tablet  1  . Niacin CR 1000 MG TBCR Take 1 tablet by mouth daily.        . rosuvastatin (CRESTOR) 20 MG tablet Take 20 mg by mouth daily.       . tacrolimus (PROTOPIC) 0.03 % ointment Apply 1 application topically. Apply to face and neck 3 times a week        . tadalafil (CIALIS) 5 MG tablet Take 5 mg by mouth daily.        . Venlafaxine HCl 150 MG TB24 Take 2 tablets by mouth daily.           Past Medical History  Diagnosis Date  . Coronary artery disease   . Chronic kidney disease     stage III  . Diabetes mellitus   . Nephrolithiasis     h/o  . Hypertension   . Peripheral neuropathy     suspect diabetes related. has led to gait instability. seen by [facility], head MRI showed only mild diffuse atrophy with moderate peri-sylvan atrophy.  Marland Kitchen PTSD (post-traumatic stress disorder)     takes prazosin for this.  . Atrial  fibrillation or flutter     ablation of flutter 2/10. Holter monitor 9/20 showed predominantly NSR with some short runs of atrial fibrillation. Coumadin stopped 7/11 due to frequent falls.  . Diastolic CHF, acute     Last echo was in 9/10 showing EF 50-55%, mild LVH, grade I diastolic dysfunction, mild MR.  . OSA (obstructive sleep apnea)     Unable to tolerate CPAP because of allergic reaction to straps of face mask.  . Arrhythmia     Atrial fibrillation/atrial flutter with ablation of flutter 2/10. Holter monitor 9/10 showed predominantly NSR with some short runs of afib. Coumadin stopped in 7/11 due to frequent falls.  . CHF (congestive heart failure)      Past Surgical History  Procedure Date  . Rotator cuff repair   . Coronary artery bypass graft 1995  . Hernia repair      History reviewed. No pertinent family history.   History   Social History  . Marital Status: Widowed    Spouse Name: N/A    Number of Children: N/A  . Years of Education: N/A   Occupational History  . Disabled    Social  History Main Topics  . Smoking status: Former Smoker    Types: Cigarettes  . Smokeless tobacco: Never Used  . Alcohol Use: No  . Drug Use: No  . Sexually Active: Not on file   Other Topics Concern  . Not on file   Social History Narrative   Lives in [place].       PHYSICAL EXAM   BP 107/67  Pulse 81  Ht 5\' 8"  (1.727 m)  Wt 201 lb (91.173 kg)  BMI 30.56 kg/m2  Constitutional: He is oriented to person, place, and time. He appears well-developed and well-nourished. No distress.  HENT: No nasal discharge.  Head: Normocephalic and atraumatic.  Eyes: Pupils are equal and round. Right eye exhibits no discharge. Left eye exhibits no discharge.  Neck: Normal range of motion. Neck supple. No JVD present. No thyromegaly present. Bilateral carotid bruits Cardiovascular: Normal rate, regular rhythm, normal heart sounds and. Exam reveals no gallop and no friction rub. No murmur heard. There is a bruit in the right shoulder area.  Pulmonary/Chest: Effort normal and breath sounds normal. No stridor. No respiratory distress. He has no wheezes. He has no rales. He exhibits no tenderness.  Abdominal: Soft. Bowel sounds are normal. He exhibits no distension. There is no tenderness. There is no rebound and no guarding.  Musculoskeletal: Normal range of motion. He exhibits no edema and no tenderness.  Neurological: He is alert and oriented to person, place, and time. Coordination normal.  Skin: Skin is warm and dry. No rash noted. He is not diaphoretic. No erythema. No pallor.  Psychiatric: He has a flat affect. His behavior is normal but his responses are slow. Judgment and thought content normal.      EKG: Normal sinus rhythm with no significant ST or T wave changes.   ASSESSMENT AND PLAN

## 2012-12-01 ENCOUNTER — Ambulatory Visit: Payer: Medicare Other | Admitting: Cardiovascular Disease

## 2012-12-06 ENCOUNTER — Encounter: Payer: Self-pay | Admitting: Nurse Practitioner

## 2012-12-06 ENCOUNTER — Ambulatory Visit (INDEPENDENT_AMBULATORY_CARE_PROVIDER_SITE_OTHER): Payer: Medicare Other | Admitting: Nurse Practitioner

## 2012-12-06 VITALS — BP 122/70 | HR 76 | Ht 68.0 in | Wt 205.2 lb

## 2012-12-06 DIAGNOSIS — E785 Hyperlipidemia, unspecified: Secondary | ICD-10-CM

## 2012-12-06 DIAGNOSIS — I779 Disorder of arteries and arterioles, unspecified: Secondary | ICD-10-CM

## 2012-12-06 DIAGNOSIS — R0602 Shortness of breath: Secondary | ICD-10-CM

## 2012-12-06 DIAGNOSIS — I10 Essential (primary) hypertension: Secondary | ICD-10-CM

## 2012-12-06 DIAGNOSIS — R079 Chest pain, unspecified: Secondary | ICD-10-CM | POA: Diagnosis not present

## 2012-12-06 DIAGNOSIS — I2510 Atherosclerotic heart disease of native coronary artery without angina pectoris: Secondary | ICD-10-CM | POA: Diagnosis not present

## 2012-12-06 DIAGNOSIS — M542 Cervicalgia: Secondary | ICD-10-CM | POA: Diagnosis not present

## 2012-12-06 NOTE — Patient Instructions (Addendum)
Your physician wants you to follow-up in: 6 months with Dr. Mariah Milling. You will receive a reminder letter in the mail two months in advance. If you don't receive a letter, please call our office to schedule the follow-up appointment.  Call us if you decide to proceed with carotid ultrasound-[phone]

## 2012-12-06 NOTE — Progress Notes (Signed)
Patient Name: Michael Lucas Date of Encounter: 12/06/2012  Primary Care Provider:  Bosie Clos, MD Primary Cardiologist:  Judie Petit. Kirke Corin, MD  Patient Profile  66 year old male with history of coronary artery disease who presents for routine followup.  Problem List   Past Medical History  Diagnosis Date  . Coronary artery disease     a. 1995 s/p CABG x 1 (LIMA->LAD);  b. 04/2001 Cath: LAD 100, patent LIMA->LAD, otw nonobs LCX/RCA dzs.  . Chronic kidney disease     stage III  . Diabetes mellitus   . Nephrolithiasis     h/o  . Hypertension   . Peripheral neuropathy     suspect diabetes related. has led to gait instability. seen by [facility], head MRI showed only mild diffuse atrophy with moderate peri-sylvan atrophy.  Marland Kitchen PTSD (post-traumatic stress disorder)     takes prazosin for this.  . Atrial fibrillation or flutter     ablation of flutter 2/10. Holter monitor 9/20 showed predominantly NSR with some short runs of atrial fibrillation. Coumadin stopped 7/11 due to frequent falls.  . Diastolic CHF, acute     Last echo was in 9/10 showing EF 50-55%, mild LVH, grade I diastolic dysfunction, mild MR.  . OSA (obstructive sleep apnea)     Unable to tolerate CPAP because of allergic reaction to straps of face mask.  . Arrhythmia     Atrial fibrillation/atrial flutter with ablation of flutter 2/10. Holter monitor 9/10 showed predominantly NSR with some short runs of afib. Coumadin stopped in 7/11 due to frequent falls.  . Chronic diastolic CHF (congestive heart failure)     a. 07/2009 Echo: EF 50-55%, Gr 1 DD, Mild MR.  Marland Kitchen Ankle fracture, left   . Carotid arterial disease     a. 11/2011 U/S: 0-39% bilat ICA stenosis.  . Cervical disc disease   . Chronic neck pain   . Chronic back pain    Past Surgical History  Procedure Date  . Rotator cuff repair   . Coronary artery bypass graft 1995  . Hernia repair    Allergies  No Known Allergies  HPI  67 year old male with  the above problem list. He was last seen in clinic in July. Since then, he has been stable. He denies chest pain but does have chronic dyspnea on exertion. He is relatively sedentary and inactive and notes that the most exertion he might do during the week his grocery shopping. He has not noticed any significant change in his chronic dyspnea but does feel limited by its. He denies PND, orthopnea, dizziness, syncope, edema, early satiety, or weight gain. He has chronic cervical neck pain and has had some positional right-sided upper chest discomfort is worsened when he moves his neck. He is followed closely in pain clinic.  Home Medications  Prior to Admission medications   Medication Sig Start Date End Date Taking? Authorizing Provider  aspirin 325 MG tablet Take 325 mg by mouth daily.     Yes Historical Provider, MD  clonazePAM (KLONOPIN) 1 MG tablet Take 1 mg by mouth. 1 tab daily at noon, 2 tabs at bedtime    Yes Historical Provider, MD  docusate sodium (COLACE) 100 MG capsule Take 100 mg by mouth 2 (two) times daily.    Yes Historical Provider, MD  Emollient (EUCERIN PLUS) 2.5-10 % CREA Apply 1 application topically daily as needed.     Yes Historical Provider, MD  furosemide (LASIX) 40 MG tablet Take 40 mg by mouth  daily.     Yes Historical Provider, MD  insulin aspart (NOVOLOG) 100 UNIT/ML injection 10 units SQ before meals TID   Yes Historical Provider, MD  insulin glargine (LANTUS) 100 UNIT/ML injection Inject 32 Units into the skin at bedtime.    Yes Historical Provider, MD  ketoconazole (NIZORAL) 2 % shampoo Apply 1 application topically daily. For head    Yes Historical Provider, MD  methadone (DOLOPHINE) 10 MG tablet Take 10 mg by mouth 3 (three) times daily.     Yes Historical Provider, MD  metoprolol (LOPRESSOR) 50 MG tablet Take 1 tablet (50 mg total) by mouth 2 (two) times daily. 05/30/12  Yes Iran Ouch, MD  Niacin CR 1000 MG TBCR Take 1 tablet by mouth daily.     Yes Historical  Provider, MD  rosuvastatin (CRESTOR) 20 MG tablet Take 20 mg by mouth daily.    Yes Historical Provider, MD  tacrolimus (PROTOPIC) 0.03 % ointment Apply 1 application topically. Apply to face and neck 3 times a week     Yes Historical Provider, MD  Venlafaxine HCl 150 MG TB24 Take 2 tablets by mouth daily.     Yes Historical Provider, MD   Review of Systems  As above, his chronic neck pain as well as chronic dyspnea exertion which is stable. He denies pnd, orthopnea, n, v, dizziness, syncope, edema, weight gain, or early satiety. All other systems reviewed and are otherwise negative except as noted above.  Physical Exam  Blood pressure 122/70, pulse 76, height 5\' 8"  (1.727 m), weight 205 lb 4 oz (93.101 kg).  General: Pleasant, NAD Psych: Normal affect. Neuro: Alert and oriented X 3. Moves all extremities spontaneously. HEENT: Normal  Neck: Supple without JVD. He has a right carotid bruit. Lungs:  Resp regular and unlabored, CTA. Heart: RRR no s3, s4, or murmurs. Abdomen: Soft, non-tender, non-distended, BS + x 4.  Extremities: No clubbing, cyanosis or edema. DP/PT/Radials 2+ and equal bilaterally.  Accessory Clinical Findings  ECG - regular sinus rhythm, 76, no acute ST or T changes.  Assessment & Plan  1. Coronary artery disease: Patient has not been having any chest or jaw (prior anginal equivalent) discomfort. He has chronic dyspnea exertion but she does not believe has changed much. He has not adjusted any further workup at this time. We'll continue current medical therapy which includes aspirin, beta blocker, and statin therapy.  2. Chronic diastolic congestive heart failure: He had normal LV function by echo in 2010.  He is euvolemic on exam today.  HR/BP well-controlled.  He has chronic, stable DOE.  Cont current meds.  3.  Hypertension:  Stable on current meds.  4.  Chronic neck/back pain:  Followed in pain clinic.  5.  HL:  Cont statin therapy.  6.  H/O afib/flutter:   S/p rfca.  Not a coumadin candidate 2/2 frequent falls.  In sinus.  7.  Carotid arterial dzs:  0-39% bilat ICA dzs by u/s last January.  [facility].  He will contact us if he decides to have it done here.  8.  Dispo:  F/u Dr. Kirke Corin in 6 mos.  Nicolasa Ducking, NP 12/06/2012, 4:43 PM

## 2013-04-11 ENCOUNTER — Encounter (INDEPENDENT_AMBULATORY_CARE_PROVIDER_SITE_OTHER): Payer: Medicare Other

## 2013-04-11 DIAGNOSIS — I6529 Occlusion and stenosis of unspecified carotid artery: Secondary | ICD-10-CM

## 2013-04-11 DIAGNOSIS — M542 Cervicalgia: Secondary | ICD-10-CM

## 2013-04-16 NOTE — Progress Notes (Signed)
lmtcb

## 2013-04-18 NOTE — Progress Notes (Signed)
lmtcb

## 2013-04-20 NOTE — Progress Notes (Signed)
lmtcb

## 2013-06-04 ENCOUNTER — Ambulatory Visit: Payer: Medicare Other | Admitting: Cardiovascular Disease

## 2013-06-05 ENCOUNTER — Ambulatory Visit: Payer: Medicare Other | Admitting: Cardiovascular Disease

## 2013-06-28 ENCOUNTER — Encounter: Payer: Self-pay | Admitting: *Deleted

## 2013-06-28 ENCOUNTER — Ambulatory Visit: Payer: Medicare Other | Admitting: Cardiovascular Disease

## 2013-07-16 ENCOUNTER — Ambulatory Visit: Payer: Medicare Other | Admitting: Cardiovascular Disease

## 2013-08-06 ENCOUNTER — Ambulatory Visit (INDEPENDENT_AMBULATORY_CARE_PROVIDER_SITE_OTHER): Payer: Medicare Other | Admitting: Cardiovascular Disease

## 2013-08-06 ENCOUNTER — Encounter: Payer: Self-pay | Admitting: Cardiovascular Disease

## 2013-08-06 VITALS — BP 123/81 | HR 90 | Ht 68.0 in | Wt 202.5 lb

## 2013-08-06 DIAGNOSIS — E785 Hyperlipidemia, unspecified: Secondary | ICD-10-CM

## 2013-08-06 DIAGNOSIS — I4892 Unspecified atrial flutter: Secondary | ICD-10-CM

## 2013-08-06 DIAGNOSIS — R0989 Other specified symptoms and signs involving the circulatory and respiratory systems: Secondary | ICD-10-CM | POA: Diagnosis not present

## 2013-08-06 DIAGNOSIS — I4891 Unspecified atrial fibrillation: Secondary | ICD-10-CM

## 2013-08-06 DIAGNOSIS — I5032 Chronic diastolic (congestive) heart failure: Secondary | ICD-10-CM

## 2013-08-06 DIAGNOSIS — I25810 Atherosclerosis of coronary artery bypass graft(s) without angina pectoris: Secondary | ICD-10-CM

## 2013-08-06 NOTE — Assessment & Plan Note (Addendum)
It appears that he was switched from rosuvastatin  to atorvastatin. This is being followed at the [facility]. I advised him to start taking Niaspan due to lack of efficacy.

## 2013-08-06 NOTE — Assessment & Plan Note (Signed)
He appears to be euvolemic on current dose of Lasix. 

## 2013-08-06 NOTE — Assessment & Plan Note (Signed)
He has no symptoms suggestive of angina. Continue medical therapy. 

## 2013-08-06 NOTE — Assessment & Plan Note (Signed)
No recurrent episodes since ablation. Current EKG shows sinus rhythm with PACs. Continue metoprolol and aspirin.

## 2013-08-06 NOTE — Assessment & Plan Note (Signed)
He has mild bilateral ICA stenosis less than  40% . Followup carotid Doppler in 2 years.

## 2013-08-06 NOTE — Progress Notes (Signed)
HPI  67 yo male with history of CAD s/p CABG 1995, atrial fib/flutter s/p atrial flutter ablation 2/10, HTN, gait instability/peripheral neuropathy, and diastolic CHF returns for followup.  He gets most of his care at the [facility]  in [place]. He has been doing reasonably well from a cardiac standpoint and denies any chest pain or dyspnea. He is currently suffering from major depression and has a followup appointment scheduled for tomorrow at the [facility] to address that. He denies any suicidal or homicidal ideation.    No Known Allergies   Current Outpatient Prescriptions on File Prior to Visit  Medication Sig Dispense Refill  . aspirin 325 MG tablet Take 325 mg by mouth daily.        . clonazePAM (KLONOPIN) 1 MG tablet Take 1 mg by mouth. 1 tab daily at noon, 2 tabs at bedtime       . docusate sodium (COLACE) 100 MG capsule Take 100 mg by mouth 2 (two) times daily.       . Emollient (EUCERIN PLUS) 2.5-10 % CREA Apply 1 application topically daily as needed.        . furosemide (LASIX) 40 MG tablet Take 40 mg by mouth daily.        . insulin aspart (NOVOLOG) 100 UNIT/ML injection 10 units SQ before meals TID      . insulin glargine (LANTUS) 100 UNIT/ML injection Inject 32 Units into the skin at bedtime.       Marland Kitchen ketoconazole (NIZORAL) 2 % shampoo Apply 1 application topically daily. For head       . methadone (DOLOPHINE) 10 MG tablet Take 10 mg by mouth 3 (three) times daily.        . metoprolol (LOPRESSOR) 50 MG tablet Take 1 tablet (50 mg total) by mouth 2 (two) times daily.  60 tablet  1  . Niacin CR 1000 MG TBCR Take 1 tablet by mouth daily.        . tacrolimus (PROTOPIC) 0.03 % ointment Apply 1 application topically. Apply to face and neck 3 times a week        . Venlafaxine HCl 150 MG TB24 Take 2 tablets by mouth daily.         No current facility-administered medications on file prior to visit.     Past Medical History  Diagnosis Date  . Coronary artery disease     a. 1995 s/p CABG  x 1 (LIMA->LAD);  b. 04/2001 Cath: LAD 100, patent LIMA->LAD, otw nonobs LCX/RCA dzs.  . Chronic kidney disease     stage III  . Diabetes mellitus   . Nephrolithiasis     h/o  . Hypertension   . Peripheral neuropathy     suspect diabetes related. has led to gait instability. seen by [facility], head MRI showed only mild diffuse atrophy with moderate peri-sylvan atrophy.  Marland Kitchen PTSD (post-traumatic stress disorder)     takes prazosin for this.  . Atrial fibrillation or flutter     ablation of flutter 2/10. Holter monitor 9/20 showed predominantly NSR with some short runs of atrial fibrillation. Coumadin stopped 7/11 due to frequent falls.  . Diastolic CHF, acute     Last echo was in 9/10 showing EF 50-55%, mild LVH, grade I diastolic dysfunction, mild MR.  . OSA (obstructive sleep apnea)     Unable to tolerate CPAP because of allergic reaction to straps of face mask.  . Arrhythmia     Atrial fibrillation/atrial flutter with ablation of  flutter 2/10. Holter monitor 9/10 showed predominantly NSR with some short runs of afib. Coumadin stopped in 7/11 due to frequent falls.  . Chronic diastolic CHF (congestive heart failure)     a. 07/2009 Echo: EF 50-55%, Gr 1 DD, Mild MR.  Marland Kitchen Ankle fracture, left   . Carotid arterial disease     a. 11/2011 U/S: 0-39% bilat ICA stenosis.  . Cervical disc disease   . Chronic neck pain   . Chronic back pain      Past Surgical History  Procedure Laterality Date  . Rotator cuff repair    . Coronary artery bypass graft  1995  . Hernia repair       Family History  Problem Relation Age of Onset  . Family history unknown: Yes     History   Social History  . Marital Status: Widowed    Spouse Name: N/A    Number of Children: N/A  . Years of Education: N/A   Occupational History  . Disabled    Social History Main Topics  . Smoking status: Former Smoker    Types: Cigarettes  . Smokeless tobacco: Never Used  . Alcohol Use: No  . Drug Use:  No  . Sexual Activity: Not on file   Other Topics Concern  . Not on file   Social History Narrative   Lives in [place].       PHYSICAL EXAM   BP 123/81  Pulse 90  Ht 5\' 8"  (1.727 m)  Wt 202 lb 8 oz (91.853 kg)  BMI 30.8 kg/m2  Constitutional: He is oriented to person, place, and time. He appears well-developed and well-nourished. No distress.  HENT: No nasal discharge.  Head: Normocephalic and atraumatic.  Eyes: Pupils are equal and round. Right eye exhibits no discharge. Left eye exhibits no discharge.  Neck: Normal range of motion. Neck supple. No JVD present. No thyromegaly present. Bilateral carotid bruits Cardiovascular: Normal rate, regular rhythm, normal heart sounds and. Exam reveals no gallop and no friction rub. No murmur heard. There is a bruit in the right shoulder area.  Pulmonary/Chest: Effort normal and breath sounds normal. No stridor. No respiratory distress. He has no wheezes. He has no rales. He exhibits no tenderness.  Abdominal: Soft. Bowel sounds are normal. He exhibits no distension. There is no tenderness. There is no rebound and no guarding.  Musculoskeletal: Normal range of motion. He exhibits no edema and no tenderness.  Neurological: He is alert and oriented to person, place, and time. Coordination normal.  Skin: Skin is warm and dry. No rash noted. He is not diaphoretic. No erythema. No pallor.  Psychiatric: He has a flat affect. His behavior is normal but his responses are slow. Judgment and thought content normal.      EKG: Sinus  Rhythm  - frequent PAC s  # PACs = 2. -  Nonspecific T-abnormality.   ABNORMAL     ASSESSMENT AND PLAN

## 2013-08-06 NOTE — Patient Instructions (Addendum)
Stop taking Niacin.  Continue other medications.  Follow up in 6 months.  

## 2013-08-29 ENCOUNTER — Encounter: Payer: Self-pay | Admitting: *Deleted

## 2013-08-30 ENCOUNTER — Other Ambulatory Visit: Payer: Self-pay | Admitting: *Deleted

## 2013-08-30 MED ORDER — CLONAZEPAM 1 MG PO TABS: 1.0000 mg | ORAL_TABLET | ORAL | Status: DC

## 2013-08-30 NOTE — Telephone Encounter (Signed)
Refilled Clonazepam sent to [facility].

## 2013-09-19 DIAGNOSIS — E785 Hyperlipidemia, unspecified: Secondary | ICD-10-CM | POA: Diagnosis not present

## 2013-09-19 DIAGNOSIS — F321 Major depressive disorder, single episode, moderate: Secondary | ICD-10-CM | POA: Diagnosis not present

## 2013-09-19 DIAGNOSIS — E119 Type 2 diabetes mellitus without complications: Secondary | ICD-10-CM | POA: Diagnosis not present

## 2013-09-19 DIAGNOSIS — Z23 Encounter for immunization: Secondary | ICD-10-CM | POA: Diagnosis not present

## 2013-09-19 DIAGNOSIS — I2510 Atherosclerotic heart disease of native coronary artery without angina pectoris: Secondary | ICD-10-CM | POA: Diagnosis not present

## 2013-10-19 DIAGNOSIS — Z79899 Other long term (current) drug therapy: Secondary | ICD-10-CM | POA: Diagnosis not present

## 2013-10-19 DIAGNOSIS — I10 Essential (primary) hypertension: Secondary | ICD-10-CM | POA: Diagnosis not present

## 2013-10-19 DIAGNOSIS — IMO0001 Reserved for inherently not codable concepts without codable children: Secondary | ICD-10-CM | POA: Diagnosis not present

## 2013-10-19 DIAGNOSIS — F321 Major depressive disorder, single episode, moderate: Secondary | ICD-10-CM | POA: Diagnosis not present

## 2013-10-19 DIAGNOSIS — R5381 Other malaise: Secondary | ICD-10-CM | POA: Diagnosis not present

## 2013-10-19 DIAGNOSIS — F411 Generalized anxiety disorder: Secondary | ICD-10-CM | POA: Diagnosis not present

## 2013-10-19 DIAGNOSIS — I2510 Atherosclerotic heart disease of native coronary artery without angina pectoris: Secondary | ICD-10-CM | POA: Diagnosis not present

## 2013-10-24 ENCOUNTER — Inpatient Hospital Stay: Payer: Self-pay | Admitting: Internal Medicine

## 2013-10-24 DIAGNOSIS — E119 Type 2 diabetes mellitus without complications: Secondary | ICD-10-CM | POA: Diagnosis not present

## 2013-10-24 DIAGNOSIS — S62609A Fracture of unspecified phalanx of unspecified finger, initial encounter for closed fracture: Secondary | ICD-10-CM | POA: Diagnosis present

## 2013-10-24 DIAGNOSIS — E785 Hyperlipidemia, unspecified: Secondary | ICD-10-CM | POA: Diagnosis present

## 2013-10-24 DIAGNOSIS — N19 Unspecified kidney failure: Secondary | ICD-10-CM | POA: Diagnosis not present

## 2013-10-24 DIAGNOSIS — F411 Generalized anxiety disorder: Secondary | ICD-10-CM | POA: Diagnosis present

## 2013-10-24 DIAGNOSIS — I4891 Unspecified atrial fibrillation: Secondary | ICD-10-CM | POA: Diagnosis present

## 2013-10-24 DIAGNOSIS — E861 Hypovolemia: Secondary | ICD-10-CM | POA: Diagnosis not present

## 2013-10-24 DIAGNOSIS — N170 Acute kidney failure with tubular necrosis: Secondary | ICD-10-CM | POA: Diagnosis not present

## 2013-10-24 DIAGNOSIS — R748 Abnormal levels of other serum enzymes: Secondary | ICD-10-CM | POA: Diagnosis not present

## 2013-10-24 DIAGNOSIS — I2510 Atherosclerotic heart disease of native coronary artery without angina pectoris: Secondary | ICD-10-CM | POA: Diagnosis not present

## 2013-10-24 DIAGNOSIS — E871 Hypo-osmolality and hyponatremia: Secondary | ICD-10-CM | POA: Diagnosis not present

## 2013-10-24 DIAGNOSIS — Z9889 Other specified postprocedural states: Secondary | ICD-10-CM | POA: Diagnosis not present

## 2013-10-24 DIAGNOSIS — G609 Hereditary and idiopathic neuropathy, unspecified: Secondary | ICD-10-CM | POA: Diagnosis present

## 2013-10-24 DIAGNOSIS — E860 Dehydration: Secondary | ICD-10-CM | POA: Diagnosis not present

## 2013-10-24 DIAGNOSIS — N200 Calculus of kidney: Secondary | ICD-10-CM | POA: Diagnosis not present

## 2013-10-24 DIAGNOSIS — R52 Pain, unspecified: Secondary | ICD-10-CM | POA: Diagnosis not present

## 2013-10-24 DIAGNOSIS — R5381 Other malaise: Secondary | ICD-10-CM | POA: Diagnosis not present

## 2013-10-24 DIAGNOSIS — N189 Chronic kidney disease, unspecified: Secondary | ICD-10-CM | POA: Diagnosis not present

## 2013-10-24 DIAGNOSIS — S8263XA Displaced fracture of lateral malleolus of unspecified fibula, initial encounter for closed fracture: Secondary | ICD-10-CM | POA: Diagnosis not present

## 2013-10-24 DIAGNOSIS — N179 Acute kidney failure, unspecified: Secondary | ICD-10-CM | POA: Diagnosis not present

## 2013-10-24 DIAGNOSIS — E876 Hypokalemia: Secondary | ICD-10-CM | POA: Diagnosis not present

## 2013-10-24 DIAGNOSIS — Z951 Presence of aortocoronary bypass graft: Secondary | ICD-10-CM | POA: Diagnosis not present

## 2013-10-24 DIAGNOSIS — Z9181 History of falling: Secondary | ICD-10-CM | POA: Diagnosis not present

## 2013-10-24 DIAGNOSIS — F4310 Post-traumatic stress disorder, unspecified: Secondary | ICD-10-CM | POA: Diagnosis present

## 2013-10-24 DIAGNOSIS — Z7902 Long term (current) use of antithrombotics/antiplatelets: Secondary | ICD-10-CM | POA: Diagnosis not present

## 2013-10-24 DIAGNOSIS — Z79899 Other long term (current) drug therapy: Secondary | ICD-10-CM | POA: Diagnosis not present

## 2013-10-24 DIAGNOSIS — R9431 Abnormal electrocardiogram [ECG] [EKG]: Secondary | ICD-10-CM | POA: Diagnosis not present

## 2013-10-24 DIAGNOSIS — S60229A Contusion of unspecified hand, initial encounter: Secondary | ICD-10-CM | POA: Diagnosis not present

## 2013-10-24 DIAGNOSIS — I129 Hypertensive chronic kidney disease with stage 1 through stage 4 chronic kidney disease, or unspecified chronic kidney disease: Secondary | ICD-10-CM | POA: Diagnosis present

## 2013-10-24 DIAGNOSIS — S82899A Other fracture of unspecified lower leg, initial encounter for closed fracture: Secondary | ICD-10-CM | POA: Diagnosis not present

## 2013-10-24 DIAGNOSIS — E875 Hyperkalemia: Secondary | ICD-10-CM | POA: Diagnosis not present

## 2013-10-24 DIAGNOSIS — F329 Major depressive disorder, single episode, unspecified: Secondary | ICD-10-CM | POA: Diagnosis present

## 2013-10-24 DIAGNOSIS — I5032 Chronic diastolic (congestive) heart failure: Secondary | ICD-10-CM | POA: Diagnosis not present

## 2013-10-24 DIAGNOSIS — Z87442 Personal history of urinary calculi: Secondary | ICD-10-CM | POA: Diagnosis not present

## 2013-10-24 DIAGNOSIS — M6282 Rhabdomyolysis: Secondary | ICD-10-CM | POA: Diagnosis present

## 2013-10-24 DIAGNOSIS — I509 Heart failure, unspecified: Secondary | ICD-10-CM | POA: Diagnosis not present

## 2013-10-24 DIAGNOSIS — Z7982 Long term (current) use of aspirin: Secondary | ICD-10-CM | POA: Diagnosis not present

## 2013-10-24 DIAGNOSIS — R627 Adult failure to thrive: Secondary | ICD-10-CM | POA: Diagnosis not present

## 2013-10-24 LAB — CBC
HCT: 38.7 % — ABNORMAL LOW (ref 40.0–52.0)
HGB: 13.6 g/dL (ref 13.0–18.0)
MCH: 29.3 pg (ref 26.0–34.0)
MCV: 84 fL (ref 80–100)
RDW: 13.6 % (ref 11.5–14.5)

## 2013-10-24 LAB — CK TOTAL AND CKMB (NOT AT ARMC)
CK, Total: 855 U/L — ABNORMAL HIGH (ref 35–232)
CK, Total: 789 U/L — ABNORMAL HIGH (ref 35–232)
CK-MB: 3.7 ng/mL — ABNORMAL HIGH (ref 0.5–3.6)

## 2013-10-24 LAB — COMPREHENSIVE METABOLIC PANEL
Albumin: 3.6 g/dL (ref 3.4–5.0)
Alkaline Phosphatase: 129 U/L — ABNORMAL HIGH
Anion Gap: 8 (ref 7–16)
BUN: 21 mg/dL — ABNORMAL HIGH (ref 7–18)
Chloride: 90 mmol/L — ABNORMAL LOW (ref 98–107)
Co2: 33 mmol/L — ABNORMAL HIGH (ref 21–32)
Creatinine: 3.19 mg/dL — ABNORMAL HIGH (ref 0.60–1.30)
EGFR (African American): 22 — ABNORMAL LOW
Glucose: 323 mg/dL — ABNORMAL HIGH (ref 65–99)
Osmolality: 278 (ref 275–301)
Potassium: 2.1 mmol/L — CL (ref 3.5–5.1)
SGOT(AST): 37 U/L (ref 15–37)
Total Protein: 7.8 g/dL (ref 6.4–8.2)

## 2013-10-24 LAB — MAGNESIUM: Magnesium: 2.4 mg/dL

## 2013-10-24 LAB — TROPONIN I
Troponin-I: <0.02 ng/mL
Troponin-I: <0.02 ng/mL

## 2013-10-24 LAB — POTASSIUM: Potassium: 2.2 mmol/L — CL (ref 3.5–5.1)

## 2013-10-25 DIAGNOSIS — R748 Abnormal levels of other serum enzymes: Secondary | ICD-10-CM

## 2013-10-25 DIAGNOSIS — E875 Hyperkalemia: Secondary | ICD-10-CM

## 2013-10-25 DIAGNOSIS — N189 Chronic kidney disease, unspecified: Secondary | ICD-10-CM

## 2013-10-25 DIAGNOSIS — R9431 Abnormal electrocardiogram [ECG] [EKG]: Secondary | ICD-10-CM

## 2013-10-25 LAB — BASIC METABOLIC PANEL
Anion Gap: 11 (ref 7–16)
BUN: 14 mg/dL (ref 7–18)
Calcium, Total: 8 mg/dL — ABNORMAL LOW (ref 8.5–10.1)
Chloride: 98 mmol/L (ref 98–107)
Co2: 25 mmol/L (ref 21–32)
Creatinine: 2.32 mg/dL — ABNORMAL HIGH (ref 0.60–1.30)
EGFR (African American): 32 — ABNORMAL LOW
EGFR (Non-African Amer.): 28 — ABNORMAL LOW
Glucose: 255 mg/dL — ABNORMAL HIGH (ref 65–99)
Osmolality: 277 (ref 275–301)
Potassium: 2.3 mmol/L — CL (ref 3.5–5.1)
Sodium: 134 mmol/L — ABNORMAL LOW (ref 136–145)

## 2013-10-25 LAB — URINALYSIS, COMPLETE
Bacteria: NONE SEEN
Bilirubin,UR: NEGATIVE
Glucose,UR: 150 mg/dL (ref 0–75)
Ketone: NEGATIVE
Nitrite: NEGATIVE
Ph: 6 (ref 4.5–8.0)
Protein: 30
RBC,UR: <1 /HPF (ref 0–5)
Specific Gravity: 1.004 (ref 1.003–1.030)
Squamous Epithelial: NONE SEEN

## 2013-10-25 LAB — CK: CK, Total: 681 U/L — ABNORMAL HIGH (ref 35–232)

## 2013-10-26 ENCOUNTER — Ambulatory Visit: Payer: Self-pay | Admitting: Orthopaedic Surgery

## 2013-10-26 DIAGNOSIS — N200 Calculus of kidney: Secondary | ICD-10-CM | POA: Diagnosis not present

## 2013-10-26 DIAGNOSIS — Z9889 Other specified postprocedural states: Secondary | ICD-10-CM | POA: Diagnosis not present

## 2013-10-26 DIAGNOSIS — E119 Type 2 diabetes mellitus without complications: Secondary | ICD-10-CM | POA: Diagnosis not present

## 2013-10-26 DIAGNOSIS — I2510 Atherosclerotic heart disease of native coronary artery without angina pectoris: Secondary | ICD-10-CM | POA: Diagnosis not present

## 2013-10-26 DIAGNOSIS — S82899A Other fracture of unspecified lower leg, initial encounter for closed fracture: Secondary | ICD-10-CM | POA: Diagnosis not present

## 2013-10-26 DIAGNOSIS — Z79899 Other long term (current) drug therapy: Secondary | ICD-10-CM | POA: Diagnosis not present

## 2013-10-26 DIAGNOSIS — Z7982 Long term (current) use of aspirin: Secondary | ICD-10-CM | POA: Diagnosis not present

## 2013-10-26 LAB — BASIC METABOLIC PANEL
Anion Gap: 8 (ref 7–16)
BUN: 10 mg/dL (ref 7–18)
Calcium, Total: 7.9 mg/dL — ABNORMAL LOW (ref 8.5–10.1)
Chloride: 111 mmol/L — ABNORMAL HIGH (ref 98–107)
Co2: 21 mmol/L (ref 21–32)
EGFR (African American): 38 — ABNORMAL LOW
Glucose: 174 mg/dL — ABNORMAL HIGH (ref 65–99)
Osmolality: 283 (ref 275–301)
Sodium: 140 mmol/L (ref 136–145)

## 2013-10-29 DIAGNOSIS — S82409A Unspecified fracture of shaft of unspecified fibula, initial encounter for closed fracture: Secondary | ICD-10-CM | POA: Diagnosis not present

## 2013-10-29 DIAGNOSIS — F411 Generalized anxiety disorder: Secondary | ICD-10-CM | POA: Diagnosis not present

## 2013-10-29 DIAGNOSIS — R5381 Other malaise: Secondary | ICD-10-CM | POA: Diagnosis not present

## 2013-10-29 DIAGNOSIS — I10 Essential (primary) hypertension: Secondary | ICD-10-CM | POA: Diagnosis not present

## 2013-10-29 DIAGNOSIS — E860 Dehydration: Secondary | ICD-10-CM | POA: Diagnosis not present

## 2013-10-29 DIAGNOSIS — IMO0001 Reserved for inherently not codable concepts without codable children: Secondary | ICD-10-CM | POA: Diagnosis not present

## 2013-11-01 ENCOUNTER — Ambulatory Visit: Payer: Medicare Other | Admitting: Neurology

## 2013-11-02 DIAGNOSIS — M25579 Pain in unspecified ankle and joints of unspecified foot: Secondary | ICD-10-CM | POA: Diagnosis not present

## 2013-11-02 DIAGNOSIS — S8263XA Displaced fracture of lateral malleolus of unspecified fibula, initial encounter for closed fracture: Secondary | ICD-10-CM | POA: Diagnosis not present

## 2013-11-02 DIAGNOSIS — M545 Low back pain: Secondary | ICD-10-CM | POA: Diagnosis not present

## 2013-11-14 DIAGNOSIS — M129 Arthropathy, unspecified: Secondary | ICD-10-CM | POA: Diagnosis not present

## 2013-11-14 DIAGNOSIS — I2510 Atherosclerotic heart disease of native coronary artery without angina pectoris: Secondary | ICD-10-CM | POA: Diagnosis not present

## 2013-11-14 DIAGNOSIS — R5381 Other malaise: Secondary | ICD-10-CM | POA: Diagnosis not present

## 2013-11-14 DIAGNOSIS — I10 Essential (primary) hypertension: Secondary | ICD-10-CM | POA: Diagnosis not present

## 2013-11-14 DIAGNOSIS — N289 Disorder of kidney and ureter, unspecified: Secondary | ICD-10-CM | POA: Diagnosis not present

## 2013-11-14 DIAGNOSIS — F411 Generalized anxiety disorder: Secondary | ICD-10-CM | POA: Diagnosis not present

## 2013-11-16 DIAGNOSIS — M25579 Pain in unspecified ankle and joints of unspecified foot: Secondary | ICD-10-CM | POA: Diagnosis not present

## 2013-12-07 DIAGNOSIS — M25579 Pain in unspecified ankle and joints of unspecified foot: Secondary | ICD-10-CM | POA: Diagnosis not present

## 2013-12-14 ENCOUNTER — Encounter: Payer: Self-pay | Admitting: *Deleted

## 2014-01-23 DIAGNOSIS — M25549 Pain in joints of unspecified hand: Secondary | ICD-10-CM | POA: Diagnosis not present

## 2014-01-23 DIAGNOSIS — M25579 Pain in unspecified ankle and joints of unspecified foot: Secondary | ICD-10-CM | POA: Diagnosis not present

## 2014-02-05 ENCOUNTER — Ambulatory Visit (INDEPENDENT_AMBULATORY_CARE_PROVIDER_SITE_OTHER): Payer: Medicare Other | Admitting: Cardiovascular Disease

## 2014-02-05 ENCOUNTER — Encounter: Payer: Self-pay | Admitting: Cardiovascular Disease

## 2014-02-05 VITALS — BP 133/76 | HR 67 | Ht 68.0 in | Wt 198.5 lb

## 2014-02-05 DIAGNOSIS — I6529 Occlusion and stenosis of unspecified carotid artery: Secondary | ICD-10-CM | POA: Diagnosis not present

## 2014-02-05 DIAGNOSIS — I6523 Occlusion and stenosis of bilateral carotid arteries: Secondary | ICD-10-CM | POA: Insufficient documentation

## 2014-02-05 DIAGNOSIS — E785 Hyperlipidemia, unspecified: Secondary | ICD-10-CM

## 2014-02-05 DIAGNOSIS — I4891 Unspecified atrial fibrillation: Secondary | ICD-10-CM | POA: Diagnosis not present

## 2014-02-05 DIAGNOSIS — I10 Essential (primary) hypertension: Secondary | ICD-10-CM

## 2014-02-05 DIAGNOSIS — I658 Occlusion and stenosis of other precerebral arteries: Secondary | ICD-10-CM

## 2014-02-05 DIAGNOSIS — I25810 Atherosclerosis of coronary artery bypass graft(s) without angina pectoris: Secondary | ICD-10-CM

## 2014-02-05 NOTE — Assessment & Plan Note (Signed)
Niacin was stopped during last visit. Continue treatment with high dose atorvastatin with a target LDL of less than 70.

## 2014-02-05 NOTE — Assessment & Plan Note (Signed)
He does have stable exertional dyspnea without chest discomfort. Continue medical therapy.

## 2014-02-05 NOTE — Patient Instructions (Signed)
Continue same medications.   Your physician wants you to follow-up in: 12 months.  You will receive a reminder letter in the mail two months in advance. If you don't receive a letter, please call our office to schedule the follow-up appointment.

## 2014-02-05 NOTE — Progress Notes (Signed)
Primary care physician: Dr. Rosanna Randy.  HPI  68 yo male with history of CAD s/p CABG 1995, atrial fib/flutter s/p atrial flutter ablation 2/10, HTN, gait instability/peripheral neuropathy, depression and chronic diastolic CHF returns for followup.  He gets most of his care at the [facility] in [place].  He reports being hospitalized at the [place] in December for volume depletion. The dose of furosemide was decreased to every other day. He reports having labs done posthospitalization and was told that his kidney function was back to normal. He denies any chest discomfort. He has chronic exertional dyspnea without recent worsening. He continues to suffer  from depression but reports improvement in symptoms in the last few months.   No Known Allergies    Current Outpatient Prescriptions on File Prior to Visit  Medication Sig Dispense Refill  . aspirin 325 MG tablet Take 325 mg by mouth daily.        Marland Kitchen atorvastatin (LIPITOR) 80 MG tablet Take 80 mg by mouth daily.      Marland Kitchen docusate sodium (COLACE) 100 MG capsule Take 100 mg by mouth 2 (two) times daily.       . Emollient (EUCERIN PLUS) 2.5-10 % CREA Apply 1 application topically daily as needed.        . furosemide (LASIX) 40 MG tablet Take 40 mg by mouth every other day.       . insulin aspart (NOVOLOG) 100 UNIT/ML injection 10 units SQ before meals TID      . insulin glargine (LANTUS) 100 UNIT/ML injection Inject 32 Units into the skin at bedtime.       Marland Kitchen ketoconazole (NIZORAL) 2 % shampoo Apply 1 application topically daily. For head       . methadone (DOLOPHINE) 10 MG tablet Take 10 mg by mouth 2 (two) times daily.       . metoprolol (LOPRESSOR) 50 MG tablet Take 1 tablet (50 mg total) by mouth 2 (two) times daily.  60 tablet  1  . tacrolimus (PROTOPIC) 0.03 % ointment Apply 1 application topically. Apply to face and neck 3 times a week        . Venlafaxine HCl 150 MG TB24 Take 1 tablet by mouth daily.        No current facility-administered  medications on file prior to visit.     Past Medical History  Diagnosis Date  . Coronary artery disease     a. 1995 s/p CABG x 1 (LIMA->LAD);  b. 04/2001 Cath: LAD 100, patent LIMA->LAD, otw nonobs LCX/RCA dzs.  . Chronic kidney disease     stage III  . Diabetes mellitus   . Nephrolithiasis     h/o  . Hypertension   . Peripheral neuropathy     suspect diabetes related. has led to gait instability. seen by [place] [facility], head MRI showed only mild diffuse atrophy with moderate peri-sylvan atrophy.  Marland Kitchen PTSD (post-traumatic stress disorder)     takes prazosin for this.  . Atrial fibrillation or flutter     ablation of flutter 2/10. Holter monitor 9/20 showed predominantly NSR with some short runs of atrial fibrillation. Coumadin stopped 7/11 due to frequent falls.  . Diastolic CHF, acute     Last echo was in 9/10 showing EF 50-55%, mild LVH, grade I diastolic dysfunction, mild MR.  . OSA (obstructive sleep apnea)     Unable to tolerate CPAP because of allergic reaction to straps of face mask.  . Arrhythmia     Atrial  fibrillation/atrial flutter with ablation of flutter 2/10. Holter monitor 9/10 showed predominantly NSR with some short runs of afib. Coumadin stopped in 7/11 due to frequent falls.  . Chronic diastolic CHF (congestive heart failure)     a. 07/2009 Echo: EF 50-55%, Gr 1 DD, Mild MR.  Marland Kitchen Ankle fracture, left   . Carotid arterial disease     a. 11/2011 U/S: 0-39% bilat ICA stenosis.  . Cervical disc disease   . Chronic neck pain   . Chronic back pain   . Fractured fibula      Past Surgical History  Procedure Laterality Date  . Rotator cuff repair    . Coronary artery bypass graft  1995  . Hernia repair       Family History  Problem Relation Age of Onset  . Family history unknown: Yes     History   Social History  . Marital Status: Widowed    Spouse Name: N/A    Number of Children: N/A  . Years of Education: N/A   Occupational History  . Disabled     Social History Main Topics  . Smoking status: Former Smoker    Types: Cigarettes  . Smokeless tobacco: Never Used  . Alcohol Use: No  . Drug Use: No  . Sexual Activity: Not on file   Other Topics Concern  . Not on file   Social History Narrative   Lives in [place].       PHYSICAL EXAM   BP 133/76  Pulse 67  Ht 5\' 8"  (1.727 m)  Wt 198 lb 8 oz (90.039 kg)  BMI 30.19 kg/m2  Constitutional: He is oriented to person, place, and time. He appears well-developed and well-nourished. No distress.  HENT: No nasal discharge.  Head: Normocephalic and atraumatic.  Eyes: Pupils are equal and round. Right eye exhibits no discharge. Left eye exhibits no discharge.  Neck: Normal range of motion. Neck supple. No JVD present. No thyromegaly present. Bilateral carotid bruits Cardiovascular: Normal rate, regular rhythm, normal heart sounds and. Exam reveals no gallop and no friction rub. No murmur heard. There is a bruit in the right shoulder area.  Pulmonary/Chest: Effort normal and breath sounds normal. No stridor. No respiratory distress. He has no wheezes. He has no rales. He exhibits no tenderness.  Abdominal: Soft. Bowel sounds are normal. He exhibits no distension. There is no tenderness. There is no rebound and no guarding.  Musculoskeletal: Normal range of motion. He exhibits no edema and no tenderness.  Neurological: He is alert and oriented to person, place, and time. Coordination normal.  Skin: Skin is warm and dry. No rash noted. He is not diaphoretic. No erythema. No pallor.  Psychiatric: He has a flat affect. His behavior is normal but his responses are slow. Judgment and thought content normal.      PXT:GGYIR  Rhythm  WITHIN NORMAL LIMITS    ASSESSMENT AND PLAN

## 2014-02-05 NOTE — Assessment & Plan Note (Signed)
Less than 40% bilaterally on Doppler from May of 2014. Recommend a followup carotid Doppler in May of 2016. Continue treatment of risk factors.   

## 2014-02-05 NOTE — Assessment & Plan Note (Signed)
Blood pressure is well controlled on current medications. 

## 2014-04-25 ENCOUNTER — Telehealth: Payer: Self-pay

## 2014-04-25 NOTE — Telephone Encounter (Signed)
Informed patient that they were in Dr. Jacklynn Ganong box to be signed 5/28

## 2014-04-25 NOTE — Telephone Encounter (Signed)
Pt called and wants to know the status of disability forms that he dropped off last week. Please call and advise.

## 2014-04-29 NOTE — Telephone Encounter (Signed)
Informed patient that per Dr. Fletcher Anon forms need to be filled out by PCP  Forms at front desk  Patient will pick up

## 2014-05-01 DIAGNOSIS — R55 Syncope and collapse: Secondary | ICD-10-CM | POA: Diagnosis not present

## 2014-05-01 DIAGNOSIS — I2510 Atherosclerotic heart disease of native coronary artery without angina pectoris: Secondary | ICD-10-CM | POA: Diagnosis not present

## 2014-05-01 DIAGNOSIS — E039 Hypothyroidism, unspecified: Secondary | ICD-10-CM | POA: Diagnosis not present

## 2014-05-01 DIAGNOSIS — N135 Crossing vessel and stricture of ureter without hydronephrosis: Secondary | ICD-10-CM | POA: Diagnosis not present

## 2014-05-01 DIAGNOSIS — E119 Type 2 diabetes mellitus without complications: Secondary | ICD-10-CM | POA: Diagnosis not present

## 2014-05-01 DIAGNOSIS — D649 Anemia, unspecified: Secondary | ICD-10-CM | POA: Diagnosis not present

## 2014-06-04 NOTE — Telephone Encounter (Signed)
This encounter was created in error - please disregard.

## 2014-09-03 DIAGNOSIS — Z23 Encounter for immunization: Secondary | ICD-10-CM | POA: Diagnosis not present

## 2014-09-03 DIAGNOSIS — F4310 Post-traumatic stress disorder, unspecified: Secondary | ICD-10-CM | POA: Diagnosis not present

## 2014-09-03 DIAGNOSIS — G894 Chronic pain syndrome: Secondary | ICD-10-CM | POA: Diagnosis not present

## 2014-09-03 DIAGNOSIS — F329 Major depressive disorder, single episode, unspecified: Secondary | ICD-10-CM | POA: Diagnosis not present

## 2014-09-03 DIAGNOSIS — I2510 Atherosclerotic heart disease of native coronary artery without angina pectoris: Secondary | ICD-10-CM | POA: Diagnosis not present

## 2014-09-03 DIAGNOSIS — I10 Essential (primary) hypertension: Secondary | ICD-10-CM | POA: Diagnosis not present

## 2014-09-03 DIAGNOSIS — K219 Gastro-esophageal reflux disease without esophagitis: Secondary | ICD-10-CM | POA: Diagnosis not present

## 2015-01-09 DIAGNOSIS — Z23 Encounter for immunization: Secondary | ICD-10-CM | POA: Diagnosis not present

## 2015-01-09 DIAGNOSIS — D649 Anemia, unspecified: Secondary | ICD-10-CM | POA: Diagnosis not present

## 2015-01-09 DIAGNOSIS — I2510 Atherosclerotic heart disease of native coronary artery without angina pectoris: Secondary | ICD-10-CM | POA: Diagnosis not present

## 2015-01-09 DIAGNOSIS — F419 Anxiety disorder, unspecified: Secondary | ICD-10-CM | POA: Diagnosis not present

## 2015-01-09 DIAGNOSIS — I10 Essential (primary) hypertension: Secondary | ICD-10-CM | POA: Diagnosis not present

## 2015-01-09 DIAGNOSIS — E139 Other specified diabetes mellitus without complications: Secondary | ICD-10-CM | POA: Diagnosis not present

## 2015-01-09 DIAGNOSIS — F329 Major depressive disorder, single episode, unspecified: Secondary | ICD-10-CM | POA: Diagnosis not present

## 2015-02-06 ENCOUNTER — Ambulatory Visit (INDEPENDENT_AMBULATORY_CARE_PROVIDER_SITE_OTHER): Payer: Medicare Other | Admitting: Cardiovascular Disease

## 2015-02-06 ENCOUNTER — Encounter: Payer: Self-pay | Admitting: Cardiovascular Disease

## 2015-02-06 VITALS — BP 130/62 | HR 139 | Ht 68.0 in | Wt 206.5 lb

## 2015-02-06 DIAGNOSIS — I4891 Unspecified atrial fibrillation: Secondary | ICD-10-CM | POA: Diagnosis not present

## 2015-02-06 DIAGNOSIS — I10 Essential (primary) hypertension: Secondary | ICD-10-CM

## 2015-02-06 DIAGNOSIS — I4892 Unspecified atrial flutter: Secondary | ICD-10-CM | POA: Diagnosis not present

## 2015-02-06 DIAGNOSIS — I6523 Occlusion and stenosis of bilateral carotid arteries: Secondary | ICD-10-CM | POA: Diagnosis not present

## 2015-02-06 DIAGNOSIS — I25810 Atherosclerosis of coronary artery bypass graft(s) without angina pectoris: Secondary | ICD-10-CM

## 2015-02-06 MED ORDER — METOPROLOL TARTRATE 50 MG PO TABS: 75.0000 mg | ORAL_TABLET | Freq: Two times a day (BID) | ORAL | Status: DC

## 2015-02-06 NOTE — Progress Notes (Signed)
Primary care physician: Dr. Rosanna Randy.  HPI  69 yo male with history of CAD s/p CABG 1995, atrial fib/flutter s/p atrial flutter ablation 2/10, HTN, gait instability/peripheral neuropathy, depression and chronic diastolic CHF returns for followup.  He gets most of his care at the [facility] in [place].  He is noted to be tachycardic today with a heart rate of 139 bpm. He is in atrial flutter with variable AV block. He did not take his metoprolol this morning. He complains of palpitations and increased exertional dyspnea but he cannot tell for sure the onset of atrial flutter. He denies syncope or presyncope. He has mild dizziness which has been a chronic issue for him.   No Known Allergies    Current Outpatient Prescriptions on File Prior to Visit  Medication Sig Dispense Refill  . aspirin 325 MG tablet Take 325 mg by mouth daily.      Marland Kitchen atorvastatin (LIPITOR) 80 MG tablet Take 80 mg by mouth daily.    Marland Kitchen docusate sodium (COLACE) 100 MG capsule Take 100 mg by mouth 2 (two) times daily.     . Emollient (EUCERIN PLUS) 2.5-10 % CREA Apply 1 application topically daily as needed.      . gabapentin (NEURONTIN) 300 MG capsule Take 300 mg by mouth 3 (three) times daily.     . insulin aspart (NOVOLOG) 100 UNIT/ML injection 10 units SQ before meals TID    . insulin glargine (LANTUS) 100 UNIT/ML injection Inject 35 Units into the skin at bedtime.     Marland Kitchen ketoconazole (NIZORAL) 2 % shampoo Apply 1 application topically daily. For head     . levothyroxine (SYNTHROID, LEVOTHROID) 50 MCG tablet Take 50 mcg by mouth daily before breakfast.     . methadone (DOLOPHINE) 10 MG tablet Take 10 mg by mouth 2 (two) times daily.     . metoprolol (LOPRESSOR) 50 MG tablet Take 1 tablet (50 mg total) by mouth 2 (two) times daily. 60 tablet 1  . prazosin (MINIPRESS) 2 MG capsule Take 2 mg by mouth at bedtime.    . tacrolimus (PROTOPIC) 0.03 % ointment Apply 1 application topically. Apply to face and neck 3 times a week       No current facility-administered medications on file prior to visit.     Past Medical History  Diagnosis Date  . Coronary artery disease     a. 1995 s/p CABG x 1 (LIMA->LAD);  b. 04/2001 Cath: LAD 100, patent LIMA->LAD, otw nonobs LCX/RCA dzs.  . Chronic kidney disease     stage III  . Diabetes mellitus   . Nephrolithiasis     h/o  . Hypertension   . Peripheral neuropathy     suspect diabetes related. has led to gait instability. seen by [place] neurology, head MRI showed only mild diffuse atrophy with moderate peri-sylvan atrophy.  Marland Kitchen PTSD (post-traumatic stress disorder)     takes prazosin for this.  . Atrial fibrillation or flutter     ablation of flutter 2/10. Holter monitor 9/20 showed predominantly NSR with some short runs of atrial fibrillation. Coumadin stopped 7/11 due to frequent falls.  . Diastolic CHF, acute     Last echo was in 9/10 showing EF 50-55%, mild LVH, grade I diastolic dysfunction, mild MR.  . OSA (obstructive sleep apnea)     Unable to tolerate CPAP because of allergic reaction to straps of face mask.  . Arrhythmia     Atrial fibrillation/atrial flutter with ablation of flutter 2/10.  Holter monitor 9/10 showed predominantly NSR with some short runs of afib. Coumadin stopped in 7/11 due to frequent falls.  . Chronic diastolic CHF (congestive heart failure)     a. 07/2009 Echo: EF 50-55%, Gr 1 DD, Mild MR.  Marland Kitchen Ankle fracture, left   . Carotid arterial disease     a. 11/2011 U/S: 0-39% bilat ICA stenosis.  . Cervical disc disease   . Chronic neck pain   . Chronic back pain   . Fractured fibula      Past Surgical History  Procedure Laterality Date  . Rotator cuff repair    . Coronary artery bypass graft  1995  . Hernia repair       Family History  Problem Relation Age of Onset  . Family history unknown: Yes     History   Social History  . Marital Status: Widowed    Spouse Name: N/A  . Number of Children: N/A  . Years of Education: N/A     Occupational History  . Disabled    Social History Main Topics  . Smoking status: Former Smoker    Types: Cigarettes  . Smokeless tobacco: Never Used  . Alcohol Use: No  . Drug Use: No  . Sexual Activity: Not on file   Other Topics Concern  . Not on file   Social History Narrative   Lives in [place].       PHYSICAL EXAM   BP 130/62 mmHg  Pulse 139  Ht 5\' 8"  (1.727 m)  Wt 206 lb 8 oz (93.668 kg)  BMI 31.41 kg/m2  Constitutional: He is oriented to person, place, and time. He appears well-developed and well-nourished. No distress.  HENT: No nasal discharge.  Head: Normocephalic and atraumatic.  Eyes: Pupils are equal and round. Right eye exhibits no discharge. Left eye exhibits no discharge.  Neck: Normal range of motion. Neck supple. No JVD present. No thyromegaly present. Bilateral carotid bruits Cardiovascular: Tachycardic, irregular rhythm, normal heart sounds and. Exam reveals no gallop and no friction rub. No murmur heard. There is a bruit in the right shoulder area.  Pulmonary/Chest: Effort normal and breath sounds normal. No stridor. No respiratory distress. He has no wheezes. He has no rales. He exhibits no tenderness.  Abdominal: Soft. Bowel sounds are normal. He exhibits no distension. There is no tenderness. There is no rebound and no guarding.  Musculoskeletal: Normal range of motion. He exhibits no edema and no tenderness.  Neurological: He is alert and oriented to person, place, and time. Coordination normal.  Skin: Skin is warm and dry. No rash noted. He is not diaphoretic. No erythema. No pallor.  Psychiatric: He has a flat affect. His behavior is normal but his responses are slow. Judgment and thought content normal.      EKG: Atrial flutter with variable AV block -Nonspecific ST depression  -Nondiagnostic.   ABNORMAL     ASSESSMENT AND PLAN

## 2015-02-06 NOTE — Patient Instructions (Signed)
Labs today.   Your physician has requested that you have an echocardiogram. Echocardiography is a painless test that uses sound waves to create images of your heart. It provides your doctor with information about the size and shape of your heart and how well your heart's chambers and valves are working. This procedure takes approximately one hour. There are no restrictions for this procedure.  Increase Metoprolol to 75 mg (1 and 1/2 tablet twice daily)  Follow up in 1 week.

## 2015-02-07 LAB — CBC WITH DIFFERENTIAL/PLATELET
BASOS ABS: 0 10*3/uL (ref 0.0–0.2)
Basos: 1 %
EOS: 6 %
Eosinophils Absolute: 0.6 10*3/uL — ABNORMAL HIGH (ref 0.0–0.4)
HCT: 38.9 % (ref 37.5–51.0)
Hemoglobin: 13.4 g/dL (ref 12.6–17.7)
IMMATURE GRANULOCYTES: 0 %
Immature Grans (Abs): 0 10*3/uL (ref 0.0–0.1)
LYMPHS ABS: 3 10*3/uL (ref 0.7–3.1)
Lymphs: 34 %
MCH: 30.2 pg (ref 26.6–33.0)
MCHC: 34.4 g/dL (ref 31.5–35.7)
MCV: 88 fL (ref 79–97)
MONOS ABS: 0.7 10*3/uL (ref 0.1–0.9)
Monocytes: 8 %
Neutrophils Absolute: 4.4 10*3/uL (ref 1.4–7.0)
Neutrophils Relative %: 51 %
PLATELETS: 232 10*3/uL (ref 150–379)
RBC: 4.43 x10E6/uL (ref 4.14–5.80)
RDW: 12.3 % (ref 12.3–15.4)
WBC: 8.8 10*3/uL (ref 3.4–10.8)

## 2015-02-07 LAB — BASIC METABOLIC PANEL
BUN/Creatinine Ratio: 15 (ref 10–22)
BUN: 18 mg/dL (ref 8–27)
CALCIUM: 9.7 mg/dL (ref 8.6–10.2)
CO2: 20 mmol/L (ref 18–29)
CREATININE: 1.17 mg/dL (ref 0.76–1.27)
Chloride: 100 mmol/L (ref 97–108)
GFR calc non Af Amer: 63 mL/min/{1.73_m2} (ref >59)
GFR, EST AFRICAN AMERICAN: 73 mL/min/{1.73_m2} (ref >59)
Glucose: 168 mg/dL — ABNORMAL HIGH (ref 65–99)
Potassium: 4.2 mmol/L (ref 3.5–5.2)
Sodium: 141 mmol/L (ref 134–144)

## 2015-02-07 LAB — TSH: TSH: 4.61 u[IU]/mL — ABNORMAL HIGH (ref 0.450–4.500)

## 2015-02-07 NOTE — Assessment & Plan Note (Signed)
He has no clear symptoms of angina. Continue medical therapy.

## 2015-02-07 NOTE — Assessment & Plan Note (Addendum)
The patient is noted to be in recurrent atrial flutter with rapid ventricular response. She does not seem to be significantly symptomatic. I requested routine labs to ensure no reversible causes. He does appear to be pale and thus I elected not to start anticoagulation until we make sure he is not anemic. He denies any obvious bleeding. I increased the dose of metoprolol to 75 mg twice daily. I will consider starting anticoagulation after reviewing his labs. I requested an echocardiogram. Follow-up in one week. I advised him to go to the emergency room if symptoms worsen.

## 2015-02-07 NOTE — Assessment & Plan Note (Signed)
Less than 40% bilaterally on Doppler from May of 2014. Recommend a followup carotid Doppler in May of 2016. Continue treatment of risk factors.

## 2015-02-07 NOTE — Assessment & Plan Note (Signed)
Blood pressure is reasonably controlled. 

## 2015-02-10 ENCOUNTER — Other Ambulatory Visit: Payer: Medicare Other

## 2015-02-10 ENCOUNTER — Other Ambulatory Visit: Payer: Self-pay | Admitting: Radiology

## 2015-02-14 ENCOUNTER — Ambulatory Visit: Payer: Medicare Other | Admitting: Cardiovascular Disease

## 2015-02-27 ENCOUNTER — Encounter: Payer: Self-pay | Admitting: *Deleted

## 2015-03-04 ENCOUNTER — Encounter: Payer: Self-pay | Admitting: Cardiovascular Disease

## 2015-03-04 ENCOUNTER — Ambulatory Visit (INDEPENDENT_AMBULATORY_CARE_PROVIDER_SITE_OTHER): Payer: Medicare Other | Admitting: Cardiovascular Disease

## 2015-03-04 VITALS — BP 148/72 | HR 69 | Ht 68.0 in | Wt 211.5 lb

## 2015-03-04 DIAGNOSIS — I25810 Atherosclerosis of coronary artery bypass graft(s) without angina pectoris: Secondary | ICD-10-CM

## 2015-03-04 DIAGNOSIS — I4892 Unspecified atrial flutter: Secondary | ICD-10-CM

## 2015-03-04 DIAGNOSIS — I10 Essential (primary) hypertension: Secondary | ICD-10-CM

## 2015-03-04 DIAGNOSIS — I6523 Occlusion and stenosis of bilateral carotid arteries: Secondary | ICD-10-CM | POA: Diagnosis not present

## 2015-03-04 DIAGNOSIS — I4891 Unspecified atrial fibrillation: Secondary | ICD-10-CM | POA: Diagnosis not present

## 2015-03-04 MED ORDER — METOPROLOL TARTRATE 100 MG PO TABS: 100.0000 mg | ORAL_TABLET | Freq: Two times a day (BID) | ORAL | Status: AC

## 2015-03-04 MED ORDER — RIVAROXABAN 20 MG PO TABS: 20.0000 mg | ORAL_TABLET | Freq: Every day | ORAL | Status: AC

## 2015-03-04 NOTE — Patient Instructions (Signed)
Increase Metoprolol to 100 mg twice daily.  Stop Aspirin.  Start Xarelto 20 mg daily with supper  Reschedule echocardiogram.   Follow up in 3 months.

## 2015-03-04 NOTE — Progress Notes (Signed)
Primary care physician: Dr. Rosanna Randy.  HPI  69 yo male with history of CAD s/p CABG 1995, atrial fib/flutter s/p atrial flutter ablation 2/10, HTN, gait instability/peripheral neuropathy, depression and chronic diastolic CHF returns for followup.  He gets most of his care at the [facility] in [place].  He was noted to be tachycardic during last visit with a heart rate of 139 bpm. he was in atrial flutter with variable AV block versus coarse atrial fibrillation.  His labs were unremarkable including CBC and basic metabolic profile. TSH was borderline elevated. I increased the dose of metoprolol to 75 mg twice daily. He reports improvement in symptoms but still with intermittent palpitations.  No Known Allergies    Current Outpatient Prescriptions on File Prior to Visit  Medication Sig Dispense Refill  . atorvastatin (LIPITOR) 80 MG tablet Take 80 mg by mouth daily.    Marland Kitchen docusate sodium (COLACE) 100 MG capsule Take 100 mg by mouth 2 (two) times daily.     . Emollient (EUCERIN PLUS) 2.5-10 % CREA Apply 1 application topically daily as needed.      . insulin aspart (NOVOLOG) 100 UNIT/ML injection 10 units SQ before meals TID    . insulin glargine (LANTUS) 100 UNIT/ML injection Inject 35 Units into the skin at bedtime.     Marland Kitchen ketoconazole (NIZORAL) 2 % shampoo Apply 1 application topically daily. For head     . levothyroxine (SYNTHROID, LEVOTHROID) 50 MCG tablet Take 50 mcg by mouth daily before breakfast.     . methadone (DOLOPHINE) 10 MG tablet Take 10 mg by mouth 2 (two) times daily.     . niacin 500 MG tablet Take 1,000 mg by mouth at bedtime.    . prazosin (MINIPRESS) 2 MG capsule Take 2 mg by mouth at bedtime.    Marland Kitchen QUEtiapine (SEROQUEL) 100 MG tablet Take 50 mg by mouth at bedtime.    . sertraline (ZOLOFT) 100 MG tablet Take 50 mg by mouth daily.    . tacrolimus (PROTOPIC) 0.03 % ointment Apply 1 application topically. Apply to face and neck 3 times a week       No current  facility-administered medications on file prior to visit.     Past Medical History  Diagnosis Date  . Coronary artery disease     a. 1995 s/p CABG x 1 (LIMA->LAD);  b. 04/2001 Cath: LAD 100, patent LIMA->LAD, otw nonobs LCX/RCA dzs.  . Chronic kidney disease     stage III  . Diabetes mellitus   . Nephrolithiasis     h/o  . Hypertension   . Peripheral neuropathy     suspect diabetes related. has led to gait instability. seen by [place] neurology, head MRI showed only mild diffuse atrophy with moderate peri-sylvan atrophy.  Marland Kitchen PTSD (post-traumatic stress disorder)     takes prazosin for this.  . Atrial fibrillation or flutter     ablation of flutter 2/10. Holter monitor 9/20 showed predominantly NSR with some short runs of atrial fibrillation. Coumadin stopped 7/11 due to frequent falls.  . Diastolic CHF, acute     Last echo was in 9/10 showing EF 50-55%, mild LVH, grade I diastolic dysfunction, mild MR.  . OSA (obstructive sleep apnea)     Unable to tolerate CPAP because of allergic reaction to straps of face mask.  . Arrhythmia     Atrial fibrillation/atrial flutter with ablation of flutter 2/10. Holter monitor 9/10 showed predominantly NSR with some short runs of afib. Coumadin  stopped in 7/11 due to frequent falls.  . Chronic diastolic CHF (congestive heart failure)     a. 07/2009 Echo: EF 50-55%, Gr 1 DD, Mild MR.  Marland Kitchen Ankle fracture, left   . Carotid arterial disease     a. 11/2011 U/S: 0-39% bilat ICA stenosis.  . Cervical disc disease   . Chronic neck pain   . Chronic back pain   . Fractured fibula      Past Surgical History  Procedure Laterality Date  . Rotator cuff repair    . Coronary artery bypass graft  1995  . Hernia repair       Family History  Problem Relation Age of Onset  . Family history unknown: Yes     History   Social History  . Marital Status: Widowed    Spouse Name: N/A  . Number of Children: N/A  . Years of Education: N/A   Occupational  History  . Disabled    Social History Main Topics  . Smoking status: Former Smoker    Types: Cigarettes  . Smokeless tobacco: Never Used  . Alcohol Use: No  . Drug Use: No  . Sexual Activity: Not on file   Other Topics Concern  . Not on file   Social History Narrative   Lives in [place].       PHYSICAL EXAM   BP 148/72 mmHg  Pulse 69  Ht 5\' 8"  (1.727 m)  Wt 211 lb 8 oz (95.936 kg)  BMI 32.17 kg/m2  Constitutional: He is oriented to person, place, and time. He appears well-developed and well-nourished. No distress.  HENT: No nasal discharge.  Head: Normocephalic and atraumatic.  Eyes: Pupils are equal and round. Right eye exhibits no discharge. Left eye exhibits no discharge.  Neck: Normal range of motion. Neck supple. No JVD present. No thyromegaly present. Bilateral carotid bruits Cardiovascular: Tachycardic, irregular rhythm, normal heart sounds and. Exam reveals no gallop and no friction rub. No murmur heard. There is a bruit in the right shoulder area.  Pulmonary/Chest: Effort normal and breath sounds normal. No stridor. No respiratory distress. He has no wheezes. He has no rales. He exhibits no tenderness.  Abdominal: Soft. Bowel sounds are normal. He exhibits no distension. There is no tenderness. There is no rebound and no guarding.  Musculoskeletal: Normal range of motion. He exhibits no edema and no tenderness.  Neurological: He is alert and oriented to person, place, and time. Coordination normal.  Skin: Skin is warm and dry. No rash noted. He is not diaphoretic. No erythema. No pallor.  Psychiatric: He has a flat affect. His behavior is normal but his responses are slow. Judgment and thought content normal.      EKG: Normal sinus rhythm with no significant ST or T wave changes.    ASSESSMENT AND PLAN

## 2015-03-05 ENCOUNTER — Encounter: Payer: Self-pay | Admitting: Cardiovascular Disease

## 2015-03-05 NOTE — Assessment & Plan Note (Signed)
Less than 40% bilaterally on Doppler from May of 2014. Recommend a followup carotid Doppler  later this year. Continue treatment of risk factors.

## 2015-03-05 NOTE — Assessment & Plan Note (Signed)
Blood pressure is reasonably controlled on current medications. 

## 2015-03-05 NOTE — Assessment & Plan Note (Signed)
He has no symptoms of angina. Continue medical therapy. 

## 2015-03-05 NOTE — Assessment & Plan Note (Signed)
He is doing better overall after increasing the dose of metoprolol but continues to have intermittent palpitations. Thus, I increased the dose to 100 mg twice daily. CAHDS VASc score is 4. Thus, I recommend long-term anticoagulation. I elected to start him on Xarelto 20 mg once daily.

## 2015-03-17 ENCOUNTER — Other Ambulatory Visit: Payer: Self-pay | Admitting: Cardiovascular Disease

## 2015-03-17 DIAGNOSIS — I25810 Atherosclerosis of coronary artery bypass graft(s) without angina pectoris: Secondary | ICD-10-CM

## 2015-03-17 DIAGNOSIS — I4891 Unspecified atrial fibrillation: Secondary | ICD-10-CM

## 2015-03-17 DIAGNOSIS — I4892 Unspecified atrial flutter: Secondary | ICD-10-CM

## 2015-03-21 NOTE — Discharge Summary (Signed)
PATIENT NAME:  Michael Lucas, Michael Lucas DATE OF BIRTH:  1946-01-07  DATE OF ADMISSION:  10/24/2013 DATE OF DISCHARGE:  10/26/2013  PRIMARY CARE PHYSICIAN:  Dr. Rosanna Lucas   DISCHARGE DIAGNOSES:  1.  Acute renal failure.  2.  Hypokalemia.  3.  Hyponatremia.   4.  Fall.  5.  Rhabdomyolysis.  6.  Mildly displaced distal fibular fracture. No tibial fracture.   CT of the head without contrast showed no acute abnormalities.   CONSULTANTS: Dr. Rockey Lucas with cardiology, Dr. Elveria Lucas with orthopedics.   ADMITTING HISTORY AND PHYSICAL:  Please see detailed H and P dictated by Dr. Posey Lucas previously. In brief, a 69 year old Caucasian male patient with history of hypertension, diabetes, CAD, who presented to the hospital with frequent falls and weakness. The patient was found to have severe hypokalemia, acute renal failure. Also, a distal fibular fracture. Admitted to the hospitalist service.   HOSPITAL COURSE:  1.  Frequent falls. This was secondary to dehydration, acute renal failure and hypokalemia causing muscle weakness. The patient had sustained a right fibular fracture for the same.  2.  Severe hypokalemia. The patient was on Lasix, but not potassium supplementation, which caused his hypokalemia. It was replaced orally and at the time of discharge, will be on potassium supplementation daily and, by the day of discharge, this is at 4.3, improved.  3.  Acute renal failure secondary to over-diuresis from Lasix, dehydration from decreased intake and mild rhabdomyolysis. CK is trending down. The patient's Lasix has been held with IV fluids. Creatinine is close to normal and Lasix will be stopped until the patient follows with his primary care physician.  4.  Right fibular fracture. The patient had a splint placed in the ER. Orthopedics was consulted,  who suggested the patient have surgery, but the patient has refused surgery. I have also discussed with patient's daughter to see if she could convince  him to stay and get the surgery, but she explains they would like to discuss with his primary care physician as outpatient prior to deciding any surgeries. I have explained that he should not bear any weight. The patient was offered rehab placement. Also home health, but this was refused. The patient has been advised to be compliant and follow up with orthopedics, Dr. Elveria Lucas, who he saw in the hospital. Prior to discharge, the patient has some tenderness in his right ankle. Lungs sound clear. Feels much better and is being discharged home.   DISCHARGE MEDICATIONS: Include:  1.  Metoprolol tartrate 50 mg oral 2 times a day.  2.  Docusate/senna 1 tablet 2 times a day.  3.  Methadone 10 mg oral 2 times a day.  4.  Niacin 2 tablets oral once a day.  5.  Aspirin 325 mg oral once a day.  6.  Atorvastatin 80 mg daily.  7.  Bupropion 150 mg oral 2 times a day.  8.  Zosyn/prazosin 2 mg 2 capsules oral once a day at bedtime. 9.  Clonazepam 1 mg oral b.i.d.  10. Gabapentin 300 mg oral 3 times a day.  11. Magnesium oxide 400 mg oral once a day.  12. Potassium chloride 10 mEq oral once a day.   DISCHARGE INSTRUCTIONS: Low-sodium diet. Activity as tolerated. Follow up with orthopedics for right fibular fracture in 1 to 2 weeks and primary care physician in 1 to 2 weeks. The patient has been advised to stop Lasix until he follows up with his primary care physician.   Time spent  on day of discharge in discharge activity was 50 minutes.  ____________________________ Michael Alf Kayo Zion, MD srs:sw D: 10/29/2013 01:51:27 ET T: 10/29/2013 03:07:21 ET JOB#: [id number]  cc: Michael Heimlich R. Soren Lazarz, MD, <Dictator>             Michael L. Michael Randy, MD Michael Carp MD ELECTRONICALLY SIGNED 11/01/2013 2:37

## 2015-03-21 NOTE — Consult Note (Signed)
General Aspect 69 yo male with history of CAD s/p CABG 1995, atrial fib/flutter s/p atrial flutter ablation 2/10, HTN, gait instability/peripheral neuropathy, and diastolic CHF who presents for falls, fractured fibula, severe renal impairment and electrolyte abn.  He gets most of his care at the [facility] in [place]. He is currently suffering from major depression and in the past He denied any suicidal or homicidal ideation.  He presents to the Emergency Room after being sent by Dr. Marlan Palau office for abn blood work. he had several  falls prior tio admission and was not able to take care of himself. In the ER, He was found to be very dehydrated with creatinine up to 3.19. His baseline creatinine is 1.43. This was in September 2012.  Also with severe hypokalemia and hypomagnesemia. He was noted to have bruises over his major joints. He is supposed to use a walker; however, the patient states he most of the time forgets it.    He was  admitted for severe hypokalemia and dehydration along with acute renal failure, severely abn EKG.   The patient was also noted to have right heel pain and mildly displaced right distal fibular fracture which was splinted in the Emergency Room. His CT head is unremarkable.   Present Illness . FAMILY HISTORY: Sister with colon cancer.   SOCIAL HISTORY: He quit smoking in 1987. The patient denies any alcohol use. The patient lives by himself at home. He is supposed to use cane and/or walker. He still drives.    Past Medical History   ???  Coronary artery disease         a. 1995 s/p CABG x 1 (LIMA->LAD);  b. 04/2001 Cath: LAD 100, patent LIMA->LAD, otw nonobs LCX/RCA dzs.   ???  Chronic kidney disease         stage III   ???  Diabetes mellitus     ???  Nephrolithiasis         h/o   ???  Hypertension     ???  Peripheral neuropathy         suspect diabetes related. has led to gait instability. seen by [facility], head MRI showed only mild diffuse atrophy  with moderate peri-sylvan atrophy.   ???  PTSD (post-traumatic stress disorder)         takes prazosin for this.   ???  Atrial fibrillation or flutter         ablation of flutter 2/10. Holter monitor 9/20 showed predominantly NSR with some short runs of atrial fibrillation. Coumadin stopped 7/11 due to frequent falls.   ???  Diastolic CHF, acute         Last echo was in 9/10 showing EF 50-55%, mild LVH, grade I diastolic dysfunction, mild MR.   ???  OSA (obstructive sleep apnea)         Unable to tolerate CPAP because of allergic reaction to straps of face mask.   ???  Arrhythmia         Atrial fibrillation/atrial flutter with ablation of flutter 2/10. Holter monitor 9/10 showed predominantly NSR with some short runs of afib. Coumadin stopped in 7/11 due to frequent falls.   ???  Chronic diastolic CHF (congestive heart failure)         a. 07/2009 Echo: EF 50-55%, Gr 1 DD, Mild MR.   ???  Ankle fracture, left     ???  Carotid arterial disease         a. 11/2011 U/S:  0-39% bilat ICA stenosis.   ???  Cervical disc disease     ???  Chronic neck pain     ???  Chronic back pain   Physical Exam:  GEN well developed, well nourished   HEENT red conjunctivae, hearing intact to voice, moist oral mucosa   NECK supple   RESP normal resp effort   CARD Regular rate and rhythm  Murmur   Murmur Systolic   Systolic Murmur Out flow   ABD denies tenderness  soft   LYMPH negative neck   EXTR negative edema   SKIN normal to palpation   NEURO motor/sensory function intact   PSYCH alert, A+O to time, place, person, good insight   Review of Systems:  Subjective/Chief Complaint leg pain, weakness, fatigue   General: Fatigue   Skin: No Complaints   ENT: No Complaints   Eyes: No Complaints   Neck: No Complaints   Respiratory: No Complaints   Cardiovascular: No Complaints   Gastrointestinal: No Complaints   Genitourinary: No Complaints   Vascular: No Complaints    Musculoskeletal: No Complaints   Neurologic: No Complaints   Hematologic: No Complaints   Endocrine: No Complaints   Psychiatric: No Complaints   Review of Systems: All other systems were reviewed and found to be negative   Medications/Allergies Reviewed Medications/Allergies reviewed     high cholesteral: 1997   Hypertension: 1995   Diabetes:    Cardiac Surgery: 1995   kidney stone removed: 1969   Hemorrhoidectomy: 1978   Shoulder Surgery - Right: torn rotator cuff repair, 1995   intubation: done by r.t. with #7.0 et tube using #76mller blade placed at 23cm at the lip, breath sounds equal et tube secure and pt. placed on vent. all done at 0130 hours per dr. KBerna Spare no complications noted       Admit Diagnosis:   ACUTE HYPOKALEMIA: Onset Date: 25-Oct-2013, Status: Active, Description: ACUTE HYPOKALEMIA  Home Medications: Medication Instructions Status  metoprolol tartrate 50 mg oral tablet 1 tab(s) orally 2 times a day for blood pressure Active  docusate-senna 50 mg-8.6 mg oral tablet 4 tab(s) orally 2 times a day to prevent constipation Active  methadone 10 mg oral tablet 1 tab(s) orally 3 times a day for pain Active  Slo-Niacin 500 mg oral tablet, extended release 2 tab(s) orally once a day after a low-fat snack Active  aspirin 325 mg oral tablet 1 tab(s) orally once a day with food Active  fluorouracil topical 5% topical cream Apply a small amount topically to affected area 2 times a day for red area on abdomen Active  ketoconazole topical 2% topical shampoo Apply topically to affected area once a day Active  fluocinonide topical 0.05% topical solution Apply topically to wet hair and scalp 2 times a day, As Needed - for Itching Active  hydrocortisone topical 2.5% topical lotion Apply topically to affected area 2 times a day, As Needed for redness and scaling Active  acetaminophen-HYDROcodone 325 mg-5 mg oral tablet 1 tab(s) orally every 6 hours, As Needed - for Pain  Active  gabapentin 300 mg oral capsule 1 cap(s) orally once a day (at bedtime) Active  clonazepam 1 mg oral tablet 1 tab(s) orally once a day (in the morning) and 2 tablets at bedtime, As Needed - for Anxiety, Nervousness Active  atorvastatin 80 mg oral tablet 1 tab(s) orally once a day (at bedtime) for cholesterol Active  furosemide 40 mg oral tablet 1 tab(s) orally once a day for blood pressure  and fluid control Active  acetaminophen 325 mg oral tablet 2 tab(s) orally every 6 hours, As Needed - for Pain Active  buPROPion 150 mg/12 hours oral tablet, extended release 1 tab(s) orally 2 times a day Active  prazosin 2 mg oral capsule 2 cap(s) orally once a day (at bedtime) Active   Lab Results:  Routine Chem:  27-Nov-14 06:18   Magnesium, Serum 2.4 (1.8-2.4 THERAPEUTIC RANGE: 4-7 mg/dL TOXIC: > 10 mg/dL  -----------------------)  Glucose, Serum  255  BUN 14  Creatinine (comp)  2.32  Sodium, Serum  134  Potassium, Serum  2.3  Chloride, Serum 98  CO2, Serum 25  Calcium (Total), Serum  8.0  Anion Gap 11  Osmolality (calc) 277  eGFR (African American)  32  eGFR (Non-African American)  28 (eGFR values <50m/min/1.73 m2 may be an indication of chronic kidney disease (CKD). Calculated eGFR is useful in patients with stable renal function. The eGFR calculation will not be reliable in acutely ill patients when serum creatinine is changing rapidly. It is not useful in  patients on dialysis. The eGFR calculation may not be applicable to patients at the low and high extremes of body sizes, pregnant women, and vegetarians.)  Result Comment POTASSIUM - RESULTS VERIFIED BY REPEAT TESTING.  - NOTIFIED OF CRITICAL VALUE  - C/MYA WOODARD AT [id number]/27/14-DAS  - READ-BACK PROCESS PERFORMED.  Result(s) reported on 25 Oct 2013 at 07:24AM.  Cardiac:  26-Nov-14 12:35   CK, Total  831  Troponin I < 0.02 (0.00-0.05 0.05 ng/mL or less: NEGATIVE  Repeat testing in 3-6 hrs  if clinically  indicated. >0.05 ng/mL: POTENTIAL  MYOCARDIAL INJURY. Repeat  testing in 3-6 hrs if  clinically indicated. NOTE: An increase or decrease  of 30% or more on serial  testing suggests a  clinically important change)    16:50   CK, Total  855  Troponin I < 0.02 (0.00-0.05 0.05 ng/mL or less: NEGATIVE  Repeat testing in 3-6 hrs  if clinically indicated. >0.05 ng/mL: POTENTIAL  MYOCARDIAL INJURY. Repeat  testing in 3-6 hrs if  clinically indicated. NOTE: An increase or decrease  of 30% or more on serial  testing suggests a  clinically important change)    20:29   CK, Total  789  Troponin I < 0.02 (0.00-0.05 0.05 ng/mL or less: NEGATIVE  Repeat testing in 3-6 hrs  if clinically indicated. >0.05 ng/mL: POTENTIAL  MYOCARDIAL INJURY. Repeat  testing in 3-6 hrs if  clinically indicated. NOTE: An increase or decrease  of 30% or more on serial  testing suggests a  clinically important change)    23:39   CK, Total - (Result(s) reported on 24 Oct 2013 at 11:52PM.)  27-Nov-14 06:18   CK, Total  681 (Result(s) reported on 25 Oct 2013 at 07:24AM.)   EKG:  Interpretation EKG shows NSR with rate 82 bpm with diffuse ST abn V2 to V6, II, III, AVF   Radiology Results: XRay:    26-Nov-14 13:40, Chest PA and Lateral  Chest PA and Lateral   REASON FOR EXAM:    weakness  COMMENTS:   May transport without cardiac monitor    PROCEDURE: DXR - DXR CHEST PA (OR AP) AND LATERAL  - Oct 24 2013  1:40PM     CLINICAL DATA:  Weakness.    EXAM:  CHEST  2 VIEW    COMPARISON:  06/10/2010    FINDINGS:  Prior CABG. Heart is normal size. No confluent airspace opacities or  effusions. No acute bony abnormality.   IMPRESSION:  No active cardiopulmonary disease.      Electronically Signed    By: Rolm Baptise M.D.    On: 10/24/2013 13:56         VerifiedBy: Raelyn Number, M.D.,    No Known Allergies:   Vital Signs/Nurse's Notes: **Vital Signs.:   27-Nov-14 04:45  Vital Signs Type  Routine  Temperature Temperature (F) 97.8  Celsius 36.5  Temperature Source oral  Pulse Pulse 71  Respirations Respirations 18  Systolic BP Systolic BP 712  Diastolic BP (mmHg) Diastolic BP (mmHg) 64  Mean BP 82  Pulse Ox % Pulse Ox % 97  Pulse Ox Activity Level  At rest  Oxygen Delivery Room Air/ 21 %    Impression 54mwith pAfib, HT, DM, CKD3  presenting  with falls, weakness, fractured distal fibula freuent falls, failure to thrive at home, unable to propare food, not drinking,, no energy, possible major depressive disorder per outpt notes, [facility] Asked to consult on the patient at the request of the patient. ABN ekg  1)Profoundly abnormal EKG No active ischemia Elevated cardiac enz (CK and CKMB) likely from falls, troponin is normal  ST ABN diffusely and QTc prolongation likely from electroyte abn,  He currently has no sx.  Would replete electrlytes as you are doing, medical management --hold lasix as an outpt  1)  ARF over CKD-  Improving On IVF Due to ATN/Dehydration --hold outpt lasix  2)  Severe hypokalemia Due to decreased po intake, on lasix as outpt Being repleted  3)  Falls  profound weakness, also with dehydration  He has a walker at home which he doesnt use.  4) RLE fibular fracture Splinted. ortho f/u  5)  HTN continue outpt meds  6)  DM   Electronic Signatures: GIda Rogue(MD)  (Signed 27-Nov-14 11:26)  Authored: General Aspect/Present Illness, History and Physical Exam, Review of System, Past Medical History, Health Issues, Home Medications, Labs, EKG , Radiology, Allergies, Vital Signs/Nurse's Notes, Impression/Plan   Last Updated: 27-Nov-14 11:26 by GIda Rogue(MD)

## 2015-03-21 NOTE — Consult Note (Signed)
PATIENT NAMETRYGG, MANTZ [id number] OF BIRTH:  05-24-1946 OF ADMISSION:  10/24/2013 OF CONSULTSTION: 10/26/2013  PRIMARY CARE PHYSICIAN: Miguel Aschoff, MD COMPLAINT: Right ankle fracture and right hand pain OF PRESENT ILLNESS: Mr. Swopes is a 69 year old Caucasian man with significant past history of hypercholesterolemia, hypertension, diabetes, coronary artery disease status post CABG, who lives at home by himself. Pt was admitted on 11/26 to the Emergency Roomfor falls. I was consulted for his right ankle fracture that was splinted in the ER. states that he fell last tuesday and twisted his ankle.  He also states that he hurt his hand on the right last firday.  He states that he doesn't have significant pain in his right ankle and right hand.  He was placed in a splint in the ER for a closed right fibular fx.  He was scheduled for outpt followup.  Pt reportsminimal pain in ankle and hand.  with ambulation better with rest.  pain rated minimal 2/10 achy sore. He states that he saw a physician on friday for his hand and that he does not want anything done for it. He also states that he does not want any operative intervention at this time for his right ankle.  I spoke to the Pt about the risks of not treating his ankle fracture including fx displacement and worsening arthritis and instability.  He states that he is comfortable in the splint and does not want anything done at this time.  He would like to speak with Dr. Rosanna Randy about this and may consider hacing something done at a later date. I stated that this is something that needs to be addressed in the next week.  He understands this. I gave him my name and where to reach me if he changes his mind.MEDICAL HISTORY:   Coronary artery disease status post CABG.  Nephrolithiasis with kidney stone removal.  Hemorrhoidectomy.  Right shoulder surgery.  Type 2 diabetes.  Hypertension.  Hyperlipidemia.  Depression and anxiety.  History of PTSD.   Dermatitis/seborrheic dermatitis.   No known allergies.   Tylenol 325 mg two tablets q.6 hourly.  Acetaminophen/hydrocodone 325/5 mg one tablet q.6 hourly.  Aspirin 325 mg p.o. daily.  Atorvastatin 80 mg at bedtime.  Wellbutrin 150 mg extended-release 1 tablet b.i.d.  Clonazepam 1 mg p.o. daily for anxiety.  Docusate senna 50/8.6 p.o. daily.  Fluocinonide  0.05% topical solution, apply to scalp twice a day.  Fluorouracil topical 5% affected area b.i.d.  Lasix 40 mg p.o. daily.  Gabapentin 300 mg at bedtime.  Hydrocortisone topical 2.5%, apply to affected area b.i.d.  Ketoconazole 2% topical apply to affected area.  Methadone 10 mg t.i.d. for pain.  Metoprolol 50 mg b.i.d.  Prazosin 2 mg 2 capsules once a day at bedtime.  Slo-Niacin 500 mg 2 tablets daily.  HISTORY: Sister with colon cancer.  HISTORY: He quit smoking in 1987. The patient denies any alcohol use. The patient lives by himself at home. He is supposed to use cane and/or walker. He still drives.  OF SYSTEMS:Positive for fatigue, weakness. No fever. Positive for pain in his joints. No blurred or double vision, glaucoma or cataracts. No tinnitus, ear pain, hearing loss or postnasal drip. No cough, wheeze, hemoptysis or dyspnea. No chest pain, orthopnea, edema or dyspnea on exertion. No nausea, vomiting, diarrhea, abdominal pain or hematemesis or GERD. No dysuria, hematuria.No polyuria, nocturia or thyroid problems or frequency. No acne or rash. Positive for a lot of ecchymosis, different staging, at all the major  joints. There is ecchymosis over the right side of the scalp. Right small finger bruiseNo polyuria, nocturia or thyroid problems. Positive for easy bruising and no bleeding. No CVA, TIA, dementia or dysarthria. Positive for anxiety and depression. Negative for bipolar disorder or schizophrenia.  temp 36.2, pulse 102, RR 16, bp 149/76 spo3 98% on room air EXAMINATION: a and ox3 nadSupple.labored respirationregular rate cap refill <3 sec all 4  extremities SILT C5-T1 b/l UE; SILT decreased to light touch in b/l feet improving above the knees to normal neg log roll b/l hipsfull rom 5/5 strengthto RLESeveral ecchymoses at different joints present secondary to falls. right small fingerhas full range of motion of the shoulders elbows and wrists, he has a deformity of the right small finger which he states has been that way since friday. He states he wants nothing done with itDATA: Rewiewed head is unremarkable.   right ankle shows displaced distal fibular fractureright hand pending ordered Mr. Lagrand, 69 years old, is with history of coronary artery disease status post coronary artery bypass graft, diabetes, hypertension, hyperlipidemia, who comes in with frequent falls at home. With a right distal fibula fx and what appears to be a right small finger fx had an extensive discussion with the Pt that I recommend surgery for his ankle fracture.   He states that at this time he is not interested in surgery.  I also spoke with him about the natural hx of not treating this and the increased chance for displacement, arthritis and disapointment in his outcome, he states he understands that and still does not want surgery. regards to his right small finger injury, I have ordered x rays to document the type of injury, but the Pt refuses to have a splint or any intervention done for this.  I asked him to speak to his family and his primary care doctor, and I wrote down my name and my recommendations for surgery on his ankle.  He states at this time he does not want any intervention  Pt decides to have surgery at this admission, please re-consult since at this point he is refusing my recommendations until he speaks to other providers.  He can follow up on an Outpt in 1 week with any orthopaedic surgeon if he decides to have surgery.   weight bearing RLE ortho 1 week as an outpt  Electronic Signatures: Mary Sella (MD)  (Signed on 28-Nov-14  10:25)  Authored  Last Updated: 28-Nov-14 10:25 by Mary Sella (MD)

## 2015-03-21 NOTE — H&P (Signed)
PATIENT NAME:  Michael Lucas, Michael Lucas MR#:  [id number] DATE OF BIRTH:  1946/08/01  DATE OF ADMISSION:  10/24/2013  ADDENDUM  Please add in the assessment and plan:  The patient has minimal displaced distal right fibula fracture status post fall.  It has been splinted in the Emergency Room. The patient will follow up with orthopedics as outpatient. We will have PT see the patient for gait, ambulation and training.    ____________________________ Hart Rochester. Posey Pronto, MD sap:dp D: 10/24/2013 16:04:56 ET T: 10/24/2013 16:48:41 ET JOB#: [id number]  cc: Maitland Muhlbauer A. Posey Pronto, MD, <Dictator> Ilda Basset MD ELECTRONICALLY SIGNED 10/26/2013 10:43

## 2015-03-21 NOTE — H&P (Signed)
PATIENT NAME:  Michael Lucas, Michael Lucas MR#:  [id number] DATE OF BIRTH:  [birth year]  DATE OF ADMISSION:  10/24/2013  PRIMARY CARE PHYSICIAN: Michael Aschoff, MD  CHIEF COMPLAINT: Frequent falls and weakness.   HISTORY OF PRESENT ILLNESS: Michael Lucas is a 69 year old old Caucasian gentleman with history of hypercholesterolemia, hypertension, diabetes, coronary artery disease status post CABG, who lives at home by himself. Comes to the Emergency Room after he has had about 3 falls today and was not able to take care of himself. The patient was found to be very dehydrated with creatinine up to 3.19. His baseline creatinine is 1.43. This was in September 2012.   The patient was also found to have severe hypokalemia and hypomagnesemia. He was noted to have bruises over his major joints along with some bruises and ecchymosis on his forehead due to his frequent falls. He is supposed to use cane and/or walker; however, the patient states he most of the time forgets it. He has been feeling depressed, although he tries to minimize his symptoms. He is being admitted for severe hypokalemia and dehydration along with acute renal failure. The patient received 40 mEq of KCl IV piggyback, and he is going to get 60 mg of p.o. K-Dur along with IV fluids.   The patient was also noted to have right heel pain and he has developed now mildly displaced right distal fibular fracture which has been splinted in the Emergency Room. His CT head is unremarkable. He is being admitted for further evaluation and management.   PAST MEDICAL HISTORY:  As listed. 1.  Coronary artery disease status post CABG.  2.  Nephrolithiasis with kidney stone removal.  3.  Hemorrhoidectomy.  4.  Right shoulder surgery.  5.  Type 2 diabetes.  6.  Hypertension.  7.  Hyperlipidemia.  8.  Depression and anxiety.  9.  History of PTSD.  10.  Dermatitis/seborrheic dermatitis.     ALLERGIES: No known allergies.   MEDICATIONS:  1.  Tylenol 325 mg two tablets  q.6 hourly.  2.  Acetaminophen/hydrocodone 325/5 mg one tablet q.6 hourly.  3.  Aspirin 325 mg p.o. daily.  4.  Atorvastatin 80 mg at bedtime.  5.  Wellbutrin 150 mg extended-release 1 tablet b.i.d.  6.  Clonazepam 1 mg p.o. daily for anxiety.  7.  Docusate senna 50/8.6 p.o. daily.  8.  Fluocinonide  0.05% topical solution, apply to scalp twice a day.  9.  Fluorouracil topical 5% affected area b.i.d.  10.  Lasix 40 mg p.o. daily.  11.  Gabapentin 300 mg at bedtime.  12.  Hydrocortisone topical 2.5%, apply to affected area b.i.d.  13.  Ketoconazole 2% topical apply to affected area.  14.  Methadone 10 mg t.i.d. for pain.  15.  Metoprolol 50 mg b.i.d.  16.  Prazosin 2 mg 2 capsules once a day at bedtime.  17.  Slo-Niacin 500 mg 2 tablets daily.   FAMILY HISTORY: Sister with colon cancer.   SOCIAL HISTORY: He quit smoking in 1987. The patient denies any alcohol use. The patient lives by himself at home. He is supposed to use cane and/or walker. He still drives.   REVIEW OF SYSTEMS: CONSTITUTIONAL: Positive for fatigue, weakness. No fever. Positive for pain in his joints.  EYES: No blurred or double vision, glaucoma or cataracts.  ENT: No tinnitus, ear pain, hearing loss or postnasal drip.  RESPIRATORY: No cough, wheeze, hemoptysis or dyspnea.  CARDIOVASCULAR: No chest pain, orthopnea, edema or dyspnea on exertion.  GASTROINTESTINAL:  No nausea, vomiting, diarrhea, abdominal pain or hematemesis or GERD.  GENITOURINARY: No dysuria, hematuria. ENDOCRINE: No polyuria, nocturia or thyroid problems or frequency.  SKIN: No acne or rash. Positive for a lot of ecchymosis, different staging, at all the major joints. There is ecchymosis over the right side of the scalp.  ENDOCRINE: No polyuria, nocturia or thyroid problems.  HEMATOLOGY: Positive for easy bruising and no bleeding.  NEUROLOGIC: No CVA, TIA, dementia or dysarthria.  PSYCHIATRIC: Positive for anxiety and depression. Negative for  bipolar disorder or schizophrenia.   All other systems reviewed and negative.   PHYSICAL EXAMINATION:  GENERAL: Appears weak and frail. He is afebrile. Pulse is 77. Blood pressure is 145/66. Pulse ox is 98% on room air.  HEENT: The patient has ecchymosis and bruise over the right part of the scalp with a small contusion. Nonbleeding. Pupils, PERRLA. EOM intact. Oral mucosa is very dry. Seborrheic dermatitis present over the chin and scalp.  NECK: Supple. No JVD. No carotid bruit.  RESPIRATORY: Clear to auscultation bilaterally. No rales, rhonchi, respiratory distress or labored breathing.  CARDIOVASCULAR: Both the heart sounds are normal. Rate, rhythm regular. PMI not lateralized. good pedal pulses, good femoral pulses. No lower extremity edema.  ABDOMEN: Soft, benign, nontender. No organomegaly. Positive bowel sounds.  NEUROLOGIC: Grossly intact cranial nerves II through XII. No motor or sensory deficit. The patient is overall very weak. No focal neuro deficits. Deep tendon jerks 1+ in both lower and upper extremities.  SKIN: Several ecchymoses at different joints present secondary to falls.  PSYCHIATRIC: Awake, alert. Has a flat and depressed affect.   LABORATORY DATA: White count is 14.6, H and H is 13.6 and 38.7, platelet count is 230. Glucose is 323. BUN is 21. Creatinine is 3.19. Sodium is 131, potassium is 2.1, chloride is 90, bicarb is 33. Calcium is 8.5.   CT head is unremarkable.   The right ankle shows minimally displaced distal fiibular fracture  ASSESSMENT: Michael Lucas, 69 years old, is with history of coronary artery disease status post coronary artery bypass graft, diabetes, hypertension, hyperlipidemia, who comes in with frequent falls at home. He was found to have:  1.  Acute renal failure secondary to severe dehydration from poor p.o. intake and medications. The patient is on high dose of Lasix which he has been taking on a regular basis. The patient's creatinine was found to  be 3.19. His baseline is around 1.4. Will admit patient to telemetry floor, start IV fluids with potassium and repeat potassium, both oral and IV. Watch ins and outs, metabolic panel, and consider ultrasound kidneys if creatinine does not improve. Will check METB in the morning. The patient is encouraged to have p.o. fluids as well. Will hold off on Lasix at this time.  2.  Severe hypokalemia and hypomagnesemia with prolonged QT-QTc interval. Will place the patient on telemetry floor and replete electrolytes, both oral and IV, monitor potassium and magnesium closely.  3.  History of frequent falls, failure to thrive along with ongoing depression/anxiety. The patient has been having poor appetite since he has been at times struggling to take care of himself at home. Will have physical therapy consultation and have care management look into discharge planning. I spoke with the patient's daughter in the Emergency Room for possible rehabilitation;  however, she states the patient would not want to go to rehabilitation and she will try to make arrangements where somebody will be with the patient at home. Will still have care management  look into discharge planning.  4.  Hypertension. Continue metoprolol.  5.  Hyperlipidemia, on atorvastatin.  6.  Anxiety and depression. The patient takes clonazepam; however, he has stopped taking his Wellbutrin last week given his frequent falls at home. If the patient's symptoms do not improve, consider psychiatry consultation for the management of depression.  7.  Chronic pain syndrome, on methadone and Percocet.  8.  Type 2 diabetes. The patient is not taking any medications for that. For now, will put him on sliding scale insulin. He will likely require p.o. diabetic medications. Will start something according to his sugars at home.  9.  Deep vein thrombosis prophylaxis. Subcutaneous heparin.  10.  Further workup according to the patient's clinical course.   Hospital  admission plan was discussed with the patient and the patient's daughter, who is present here in the Emergency Room.   TIME SPENT: 55 minutes.   ____________________________ Hart Rochester Posey Pronto, MD sap:np D: 10/24/2013 15:56:06 ET T: 10/24/2013 17:00:38 ET JOB#: [id number]  cc: Braedyn Kauk A. Posey Pronto, MD, <Dictator> Richard L. Rosanna Randy, MD  Ilda Basset MD ELECTRONICALLY SIGNED 10/26/2013 10:45

## 2015-03-24 ENCOUNTER — Other Ambulatory Visit: Payer: Self-pay | Admitting: Cardiovascular Disease

## 2015-03-24 DIAGNOSIS — I4891 Unspecified atrial fibrillation: Secondary | ICD-10-CM

## 2015-03-24 DIAGNOSIS — I482 Chronic atrial fibrillation, unspecified: Secondary | ICD-10-CM

## 2015-03-24 DIAGNOSIS — I25810 Atherosclerosis of coronary artery bypass graft(s) without angina pectoris: Secondary | ICD-10-CM

## 2015-04-01 ENCOUNTER — Other Ambulatory Visit: Payer: Medicare Other

## 2015-04-01 ENCOUNTER — Other Ambulatory Visit: Payer: Self-pay | Admitting: Cardiovascular Disease

## 2015-04-01 DIAGNOSIS — I4891 Unspecified atrial fibrillation: Secondary | ICD-10-CM

## 2015-04-03 DIAGNOSIS — F329 Major depressive disorder, single episode, unspecified: Secondary | ICD-10-CM | POA: Insufficient documentation

## 2015-04-03 DIAGNOSIS — F4541 Pain disorder exclusively related to psychological factors: Secondary | ICD-10-CM | POA: Insufficient documentation

## 2015-04-03 DIAGNOSIS — S82409A Unspecified fracture of shaft of unspecified fibula, initial encounter for closed fracture: Secondary | ICD-10-CM | POA: Insufficient documentation

## 2015-04-03 DIAGNOSIS — I480 Paroxysmal atrial fibrillation: Secondary | ICD-10-CM | POA: Insufficient documentation

## 2015-04-03 DIAGNOSIS — K219 Gastro-esophageal reflux disease without esophagitis: Secondary | ICD-10-CM | POA: Insufficient documentation

## 2015-04-03 DIAGNOSIS — E1142 Type 2 diabetes mellitus with diabetic polyneuropathy: Secondary | ICD-10-CM | POA: Insufficient documentation

## 2015-04-03 DIAGNOSIS — Z794 Long term (current) use of insulin: Secondary | ICD-10-CM

## 2015-04-03 DIAGNOSIS — E119 Type 2 diabetes mellitus without complications: Secondary | ICD-10-CM | POA: Insufficient documentation

## 2015-04-03 DIAGNOSIS — F4310 Post-traumatic stress disorder, unspecified: Secondary | ICD-10-CM | POA: Insufficient documentation

## 2015-04-03 DIAGNOSIS — R55 Syncope and collapse: Secondary | ICD-10-CM | POA: Insufficient documentation

## 2015-04-03 DIAGNOSIS — L219 Seborrheic dermatitis, unspecified: Secondary | ICD-10-CM | POA: Insufficient documentation

## 2015-04-03 DIAGNOSIS — Q245 Malformation of coronary vessels: Secondary | ICD-10-CM | POA: Insufficient documentation

## 2015-04-03 DIAGNOSIS — G894 Chronic pain syndrome: Secondary | ICD-10-CM | POA: Insufficient documentation

## 2015-04-03 DIAGNOSIS — K5900 Constipation, unspecified: Secondary | ICD-10-CM | POA: Insufficient documentation

## 2015-04-03 DIAGNOSIS — G629 Polyneuropathy, unspecified: Secondary | ICD-10-CM | POA: Insufficient documentation

## 2015-04-03 DIAGNOSIS — M1990 Unspecified osteoarthritis, unspecified site: Secondary | ICD-10-CM | POA: Insufficient documentation

## 2015-04-03 DIAGNOSIS — N200 Calculus of kidney: Secondary | ICD-10-CM | POA: Insufficient documentation

## 2015-04-03 DIAGNOSIS — S20219A Contusion of unspecified front wall of thorax, initial encounter: Secondary | ICD-10-CM | POA: Insufficient documentation

## 2015-04-03 DIAGNOSIS — F32A Depression, unspecified: Secondary | ICD-10-CM | POA: Insufficient documentation

## 2015-04-03 DIAGNOSIS — I10 Essential (primary) hypertension: Secondary | ICD-10-CM | POA: Insufficient documentation

## 2015-04-03 DIAGNOSIS — E039 Hypothyroidism, unspecified: Secondary | ICD-10-CM | POA: Insufficient documentation

## 2015-04-03 DIAGNOSIS — E876 Hypokalemia: Secondary | ICD-10-CM | POA: Insufficient documentation

## 2015-04-03 DIAGNOSIS — N179 Acute kidney failure, unspecified: Secondary | ICD-10-CM | POA: Insufficient documentation

## 2015-04-03 DIAGNOSIS — D649 Anemia, unspecified: Secondary | ICD-10-CM | POA: Insufficient documentation

## 2015-04-03 DIAGNOSIS — R5381 Other malaise: Secondary | ICD-10-CM | POA: Insufficient documentation

## 2015-04-18 ENCOUNTER — Other Ambulatory Visit: Payer: Medicare Other

## 2015-04-18 ENCOUNTER — Encounter: Payer: Self-pay | Admitting: *Deleted

## 2015-04-25 ENCOUNTER — Other Ambulatory Visit: Payer: Medicare Other

## 2015-05-08 ENCOUNTER — Ambulatory Visit: Payer: Medicare Other | Admitting: Family Medicine

## 2015-05-08 ENCOUNTER — Other Ambulatory Visit: Payer: Self-pay

## 2015-05-08 ENCOUNTER — Telehealth: Payer: Self-pay | Admitting: *Deleted

## 2015-05-08 ENCOUNTER — Ambulatory Visit (INDEPENDENT_AMBULATORY_CARE_PROVIDER_SITE_OTHER): Payer: Medicare Other

## 2015-05-08 DIAGNOSIS — I4891 Unspecified atrial fibrillation: Secondary | ICD-10-CM

## 2015-05-08 NOTE — Telephone Encounter (Signed)
SAMPLES GIVEN OF XARETLO.

## 2015-05-08 NOTE — Telephone Encounter (Signed)
Pt is asking for some samples of Xarelto

## 2015-05-15 ENCOUNTER — Ambulatory Visit (INDEPENDENT_AMBULATORY_CARE_PROVIDER_SITE_OTHER): Payer: Medicare Other | Admitting: Family Medicine

## 2015-05-15 ENCOUNTER — Encounter: Payer: Self-pay | Admitting: Family Medicine

## 2015-05-15 VITALS — BP 130/60 | HR 78 | Temp 97.8°F | Resp 16 | Wt 222.0 lb

## 2015-05-15 DIAGNOSIS — E119 Type 2 diabetes mellitus without complications: Secondary | ICD-10-CM | POA: Diagnosis not present

## 2015-05-15 DIAGNOSIS — M25532 Pain in left wrist: Secondary | ICD-10-CM

## 2015-05-15 DIAGNOSIS — I10 Essential (primary) hypertension: Secondary | ICD-10-CM

## 2015-05-15 DIAGNOSIS — I6523 Occlusion and stenosis of bilateral carotid arteries: Secondary | ICD-10-CM | POA: Diagnosis not present

## 2015-05-15 DIAGNOSIS — Z794 Long term (current) use of insulin: Secondary | ICD-10-CM | POA: Diagnosis not present

## 2015-05-15 LAB — POCT GLYCOSYLATED HEMOGLOBIN (HGB A1C): HEMOGLOBIN A1C: 9

## 2015-05-15 NOTE — Progress Notes (Signed)
Patient ID: Michael Lucas, male   DOB: Mar 06, 1946, 69 y.o.   MRN: [id number]    Subjective:  Wrist Pain  The pain is present in the left wrist. Episode onset: a week and a half ago. There has been a history of trauma (He lost his balance and fell forward off the bed and went down on his wrist and knees and has had wrist pain in his left wrist since. ). The problem has been gradually worsening. The quality of the pain is described as sharp. The pain is at a severity of 7/10. Associated symptoms include a limited range of motion and stiffness. Pertinent negatives include no fever, inability to bear weight, itching, joint locking, numbness or tingling. The symptoms are aggravated by activity. He has tried nothing for the symptoms.   he has had mild swelling and a lot of stiffness in the wrist. Some decreased range of motion due to the pain.  Diabetes Mellitus Type II, Follow-up:   Lab Results  Component Value Date   HGBA1C * 01/25/2009    7.3 (NOTE)   The ADA recommends the following therapeutic goal for glycemic   control related to Hgb A1C measurement:   Goal of Therapy:   < 7.0% Hgb A1C   Reference: American Diabetes Association: Clinical Practice   Recommendations 2008, Diabetes Care,  2008, 31:(Suppl 1).   Last seen for diabetes 4 months ago.  Management changes included none. He reports fair compliance with treatment. He is not having side effects.  Current symptoms include none and have been stable. Home blood sugar records: in the 200's. He reports that have he get in the 100's he starts to have hypoglycemic sx.  Episodes of hypoglycemia? no   Current Insulin Regimen: 35 units of Lantus and 10 units of Novolog Most Recent Eye Exam: over 2 years ago Weight trend: increasing rapidly Prior visit with dietician: no ------------------------------------------------------------------------   Hypertension, follow-up:  BP Readings from Last 3 Encounters:  05/15/15 130/60  01/09/15  108/60  03/04/15 148/72    He was last seen for hypertension 4 months ago.  BP at that visit was 108/60. Management changes since that visit include none.He reports good compliance with treatment. He is not having side effects.    Outside blood pressures are not being checked. He is experiencing none.  Patient denies none.    ------------------------------------------------------------------------    Lipid/Cholesterol, Follow-up:   Last seen for this 3 months ago.  Management changes since that visit include.  Last Lipid Panel:    Component Value Date/Time   CHOL 148 11/02/2011 0858   TRIG 137 11/02/2011 0858   HDL 51 11/02/2011 0858   CHOLHDL 2.9 11/02/2011 0858   VLDL 27 11/02/2011 0858   LDLCALC 70 11/02/2011 0858    He reports good compliance with treatment. He is not having side effects.  Wt Readings from Last 3 Encounters:  05/15/15 222 lb (100.699 kg)  01/09/15 205 lb (92.987 kg)  03/04/15 211 lb 8 oz (95.936 kg)   CAD/status post CABG ------------------------------------------------------------------------     Prior to Admission medications   Medication Sig Start Date End Date Taking? Authorizing Provider  aspirin 81 MG tablet Take 1 tablet by mouth daily. 09/30/10  Yes Historical Provider, MD  atorvastatin (LIPITOR) 80 MG tablet Take 80 mg by mouth daily.   Yes Historical Provider, MD  atorvastatin (LIPITOR) 80 MG tablet Take 1 tablet by mouth at bedtime. 05/01/14  Yes Historical Provider, MD  buPROPion (WELLBUTRIN SR) 150 MG  12 hr tablet Take 1 tablet by mouth 2 (two) times daily. 09/19/13  Yes Historical Provider, MD  docusate sodium (COLACE) 100 MG capsule Take 100 mg by mouth 2 (two) times daily.    Yes Historical Provider, MD  docusate sodium (COLACE) 100 MG capsule Take 1 capsule by mouth 2 (two) times daily. 09/30/10  Yes Historical Provider, MD  Emollient (EUCERIN PLUS) 2.5-10 % CREA Apply 1 application topically daily as needed.     Yes Historical  Provider, MD  Emollient (EUCERIN PLUS) 2.5-10 % CREA See admin instructions. Apply to affected areas as needed 05/01/14  Yes Historical Provider, MD  furosemide (LASIX) 40 MG tablet Take 1 tablet by mouth daily. 05/01/14  Yes Historical Provider, MD  gabapentin (NEURONTIN) 800 MG tablet Take 1 tablet by mouth 3 (three) times daily. 01/09/15  Yes Historical Provider, MD  insulin aspart (NOVOLOG) 100 UNIT/ML injection 10 units SQ before meals TID   Yes Historical Provider, MD  insulin aspart (NOVOLOG) 100 UNIT/ML injection Inject 10 Units into the skin 3 (three) times daily with meals. 05/01/14  Yes Historical Provider, MD  insulin glargine (LANTUS) 100 UNIT/ML injection Inject 35 Units into the skin at bedtime.    Yes Historical Provider, MD  insulin glargine (LANTUS) 100 UNIT/ML injection Inject 32 Units into the skin at bedtime. 05/01/14  Yes Historical Provider, MD  ketoconazole (NIZORAL) 2 % shampoo Apply 1 application topically daily. For head    Yes Historical Provider, MD  ketoconazole (NIZORAL) 2 % shampoo Apply topically See admin instructions. Apply shampoo to head daily 05/01/14  Yes Historical Provider, MD  levothyroxine (SYNTHROID, LEVOTHROID) 50 MCG tablet Take 50 mcg by mouth daily before breakfast.  01/17/14  Yes Historical Provider, MD  levothyroxine (SYNTHROID, LEVOTHROID) 50 MCG tablet Take 1 tablet by mouth daily. 05/01/14  Yes Historical Provider, MD  methadone (DOLOPHINE) 10 MG tablet Take 10 mg by mouth 2 (two) times daily.    Yes Historical Provider, MD  methadone (DOLOPHINE) 10 MG tablet Take 1 tablet by mouth 2 (two) times daily. 05/01/14  Yes Historical Provider, MD  metoprolol (LOPRESSOR) 100 MG tablet Take 1 tablet (100 mg total) by mouth 2 (two) times daily. 03/04/15  Yes Wellington Hampshire, MD  niacin (NIASPAN) 500 MG CR tablet Take 2 tablets by mouth daily. 09/30/10  Yes Historical Provider, MD  niacin 500 MG tablet Take 1,000 mg by mouth at bedtime.   Yes Historical Provider, MD  prazosin  (MINIPRESS) 2 MG capsule Take 2 mg by mouth at bedtime.   Yes Historical Provider, MD  prazosin (MINIPRESS) 2 MG capsule Take 2 capsules by mouth daily. 09/30/10  Yes Historical Provider, MD  QUEtiapine (SEROQUEL) 100 MG tablet Take 50 mg by mouth at bedtime.   Yes Historical Provider, MD  rivaroxaban (XARELTO) 20 MG TABS tablet Take 1 tablet (20 mg total) by mouth daily with supper. 03/04/15  Yes Wellington Hampshire, MD  sertraline (ZOLOFT) 100 MG tablet Take 50 mg by mouth daily.   Yes Historical Provider, MD  sertraline (ZOLOFT) 100 MG tablet Take 1 tablet by mouth daily.   Yes Historical Provider, MD  tacrolimus (PROTOPIC) 0.03 % ointment Apply 1 application topically. Apply to face and neck 3 times a week     Yes Historical Provider, MD  tacrolimus (PROTOPIC) 0.03 % ointment See admin instructions. Apply to affected area as directed 05/01/14  Yes Historical Provider, MD  traZODone (DESYREL) 50 MG tablet Take 0.5 tablets by mouth at  bedtime as needed.   Yes Historical Provider, MD  venlafaxine (EFFEXOR) 50 MG tablet Take 3 tablets by mouth daily. 05/14/14  Yes Historical Provider, MD  gabapentin (NEURONTIN) 400 MG capsule Take 400 mg by mouth 3 (three) times daily.    Historical Provider, MD    Patient Active Problem List   Diagnosis Date Noted  . Absolute anemia 04/03/2015  . Arthritis 04/03/2015  . Chronic pain associated with significant psychosocial dysfunction 04/03/2015  . Constipation 04/03/2015  . Coronary artery abnormality 04/03/2015  . Type 2 diabetes mellitus treated with insulin 04/03/2015  . Essential (primary) hypertension 04/03/2015  . Broken fibula 04/03/2015  . Acid reflux 04/03/2015  . Adult hypothyroidism 04/03/2015  . Acute kidney failure 04/03/2015  . Calculus of kidney 04/03/2015  . Neuropathy 04/03/2015  . Arthritis, degenerative 04/03/2015  . 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 AF (paroxysmal atrial fibrillation) 04/03/2015  . Decreased potassium in the blood 04/03/2015    . Neurosis, posttraumatic 04/03/2015  . Contusion of chest wall 04/03/2015  . Dermatitis seborrheica 04/03/2015  . Episode of syncope 04/03/2015  . Malaise 04/03/2015  . Clinical depression 04/03/2015  . Bilateral carotid artery stenosis 02/05/2014  . Orthostatic hypotension 06/02/2012  . Carotid bruit 11/05/2011  . Hyperlipidemia 04/10/2011  . UNSTEADY GAIT 06/24/2010  . DIZZINESS 01/21/2010  . ATRIAL FIBRILLATION 09/08/2009  . OCCLUSION&STENOS CAROTID ART W/O MENTION INFARCT 09/08/2009  . DIASTOLIC HEART FAILURE, CHRONIC 08/12/2009  . EDEMA 08/12/2009  . Essential hypertension, benign 06/16/2009  . CAD, ARTERY BYPASS GRAFT 06/16/2009  . ATRIAL FLUTTER 06/16/2009  . LEG PAIN, BILATERAL 06/16/2009    Past Medical History  Diagnosis Date  . Coronary artery disease     a. 1995 s/p CABG x 1 (LIMA->LAD);  b. 04/2001 Cath: LAD 100, patent LIMA->LAD, otw nonobs LCX/RCA dzs.  . Chronic kidney disease     stage III  . Diabetes mellitus   . Nephrolithiasis     h/o  . Hypertension   . Peripheral neuropathy     suspect diabetes related. has led to gait instability. seen by [facility], head MRI showed only mild diffuse atrophy with moderate peri-sylvan atrophy.  Marland Kitchen PTSD (post-traumatic stress disorder)     takes prazosin for this.  . Atrial fibrillation or flutter     ablation of flutter 2/10. Holter monitor 9/20 showed predominantly NSR with some short runs of atrial fibrillation. Coumadin stopped 7/11 due to frequent falls.  . Diastolic CHF, acute     Last echo was in 9/10 showing EF 50-55%, mild LVH, grade I diastolic dysfunction, mild MR.  . OSA (obstructive sleep apnea)     Unable to tolerate CPAP because of allergic reaction to straps of face mask.  . Arrhythmia     Atrial fibrillation/atrial flutter with ablation of flutter 2/10. Holter monitor 9/10 showed predominantly NSR with some short runs of afib. Coumadin stopped in 7/11 due to frequent falls.  . Chronic  diastolic CHF (congestive heart failure)     a. 07/2009 Echo: EF 50-55%, Gr 1 DD, Mild MR.  Marland Kitchen Ankle fracture, left   . Carotid arterial disease     a. 11/2011 U/S: 0-39% bilat ICA stenosis.  . Cervical disc disease   . Chronic neck pain   . Chronic back pain   . Fractured fibula     History   Social History  . Marital Status: Widowed    Spouse Name: N/A  . Number of Children: N/A  . Years of Education: N/A  Occupational History  . Disabled    Social History Main Topics  . Smoking status: Former Smoker    Types: Cigarettes  . Smokeless tobacco: Never Used  . Alcohol Use: No  . Drug Use: No  . Sexual Activity: Not on file   Other Topics Concern  . Not on file   Social History Narrative   Lives in [place].    No Known Allergies  Review of Systems  Constitutional: Negative.  Negative for fever.  HENT: Negative.   Eyes: Negative.   Cardiovascular: Negative.   Gastrointestinal: Negative.   Genitourinary: Negative.   Musculoskeletal: Positive for joint pain and stiffness.  Skin: Negative.  Negative for itching.  Neurological: Negative.  Negative for tingling and numbness.       Memory loss over the past couple of weeks.  Endo/Heme/Allergies: Negative.   Psychiatric/Behavioral: Negative.     Immunization History  Administered Date(s) Administered  . Pneumococcal Conjugate-13 09/03/2014  . Tdap 09/19/2009   Objective:  BP 130/60 mmHg  Pulse 78  Temp(Src) 97.8 F (36.6 C) (Oral)  Resp 16  Wt 222 lb (100.699 kg)  Physical Exam  Constitutional: He is oriented to person, place, and time and well-developed, well-nourished, and in no distress.  HENT:  Head: Normocephalic and atraumatic.  Right Ear: External ear normal.  Left Ear: External ear normal.  Nose: Nose normal.  Mouth/Throat: Oropharynx is clear and moist.  Eyes: Conjunctivae and EOM are normal. Pupils are equal, round, and reactive to light.  Neck: Normal range of motion. Neck supple.   Cardiovascular: Normal rate, regular rhythm, normal heart sounds and intact distal pulses.   Pulmonary/Chest: Effort normal and breath sounds normal.  Abdominal: Bowel sounds are normal.  Musculoskeletal: He exhibits tenderness.  Patient has mild swelling of the dorsum of the wrist. Snuffbox is minimally tender. He has mild pain with extension of the thumb.  Neurological: He is alert and oriented to person, place, and time.  Skin: Skin is warm and dry.  Psychiatric: Mood, memory, affect and judgment normal.    Lab Results  Component Value Date   WBC 8.8 02/06/2015   HGB 13.4 02/06/2015   HCT 38.9 02/06/2015   PLT 232 02/06/2015   GLUCOSE 168* 02/06/2015   CHOL 148 11/02/2011   TRIG 137 11/02/2011   HDL 51 11/02/2011   LDLCALC 70 11/02/2011   TSH 4.610* 02/06/2015   INR 2.3 05/19/2010   HGBA1C * 01/25/2009    7.3 (NOTE)   The ADA recommends the following therapeutic goal for glycemic   control related to Hgb A1C measurement:   Goal of Therapy:   < 7.0% Hgb A1C   Reference: American Diabetes Association: Clinical Practice   Recommendations 2008, Diabetes Care,  2008, 31:(Suppl 1).    CMP     Component Value Date/Time   NA 141 02/06/2015 1432   NA 140 10/26/2013 0554   NA 142 11/02/2011 0858   K 4.2 02/06/2015 1432   K 4.3 10/26/2013 0554   CL 100 02/06/2015 1432   CL 111* 10/26/2013 0554   CO2 20 02/06/2015 1432   CO2 21 10/26/2013 0554   GLUCOSE 168* 02/06/2015 1432   GLUCOSE 174* 10/26/2013 0554   GLUCOSE 171* 11/02/2011 0858   BUN 18 02/06/2015 1432   BUN 10 10/26/2013 0554   BUN 20 11/02/2011 0858   CREATININE 1.17 02/06/2015 1432   CREATININE 2.04* 10/26/2013 0554   CREATININE 1.23 11/02/2011 0858   CALCIUM 9.7 02/06/2015 1432  CALCIUM 7.9* 10/26/2013 0554   PROT 7.8 10/24/2013 1235   PROT 6.9 11/02/2011 0858   ALBUMIN 3.6 10/24/2013 1235   ALBUMIN 4.2 11/02/2011 0858   AST 37 10/24/2013 1235   AST 25 11/02/2011 0858   ALT 15 10/24/2013 1235   ALT 20  11/02/2011 0858   ALKPHOS 129* 10/24/2013 1235   ALKPHOS 91 11/02/2011 0858   BILITOT 0.3 11/02/2011 0858   GFRNONAA 63 02/06/2015 1432   GFRNONAA 33* 10/26/2013 0554   GFRAA 73 02/06/2015 1432   GFRAA 38* 10/26/2013 0554    Assessment and Plan :  Traumatic tendinitis of the wrist. This typically may have de Quervain's tendosynovitis I think a splint/brace will be helpful for him. We'll ahead and referred to [facility]. I do not think this is a surgical problem at this time but I think a referral to the specialist will be helpful to the patient. CAD/status post CABG. All risks factors are treated. Most care for this patient is done at the [place]. Type 2 diabetes Obtain A1c. Patient lives alone. Would like to avoid hypoglycemia in this patient. With his fragile health and advancing age R A1c goal is 7-8. Hypertension Hyperlipidemia Major depressive disorder In remission per patient. This needs to be treated and controlled. Diastolic CHF with EF of 21-22% in 2010  Known bilateral carotid artery stenosis Miguel Aschoff MD [facility] 05/15/2015 3:44 PM

## 2015-05-21 ENCOUNTER — Ambulatory Visit
Admission: RE | Admit: 2015-05-21 | Discharge: 2015-05-21 | Disposition: A | Discharge status: Home / Self Care | Payer: Medicare Other | Source: Ambulatory Visit | Attending: Family Medicine | Admitting: Family Medicine

## 2015-05-21 DIAGNOSIS — M25532 Pain in left wrist: Secondary | ICD-10-CM

## 2015-05-21 DIAGNOSIS — S6992XA Unspecified injury of left wrist, hand and finger(s), initial encounter: Secondary | ICD-10-CM | POA: Diagnosis not present

## 2015-05-23 ENCOUNTER — Telehealth: Payer: Self-pay

## 2015-05-23 DIAGNOSIS — M25539 Pain in unspecified wrist: Secondary | ICD-10-CM

## 2015-05-23 NOTE — Telephone Encounter (Signed)
Pt advised of xray results. Patent advised for pain to take Tylenol, use heat and use splint for wrist. Will go ahead refer to hand specialist.-aa

## 2015-06-12 ENCOUNTER — Ambulatory Visit: Payer: Medicare Other | Admitting: Cardiovascular Disease

## 2015-06-18 ENCOUNTER — Telehealth: Payer: Self-pay | Admitting: *Deleted

## 2015-06-18 NOTE — Telephone Encounter (Signed)
Lmom to call our office. Time to schedule a carotid u/s (2 yr f/u).

## 2015-07-24 ENCOUNTER — Ambulatory Visit: Payer: Medicare Other | Admitting: Cardiovascular Disease

## 2015-08-28 ENCOUNTER — Ambulatory Visit: Payer: Medicare Other | Admitting: Cardiovascular Disease

## 2015-09-18 ENCOUNTER — Ambulatory Visit: Payer: Medicare Other | Admitting: Family Medicine

## 2015-10-16 ENCOUNTER — Ambulatory Visit: Payer: Medicare Other | Admitting: Cardiovascular Disease

## 2015-12-12 ENCOUNTER — Ambulatory Visit: Payer: Medicare Other | Admitting: Cardiovascular Disease

## 2016-01-06 ENCOUNTER — Ambulatory Visit: Payer: Medicare Other | Admitting: Nurse Practitioner

## 2016-01-19 ENCOUNTER — Ambulatory Visit: Payer: Medicare Other | Admitting: Family Medicine

## 2016-01-26 ENCOUNTER — Ambulatory Visit: Payer: Medicare Other | Admitting: Nurse Practitioner

## 2016-01-26 ENCOUNTER — Encounter: Payer: Self-pay | Admitting: *Deleted

## 2016-01-26 ENCOUNTER — Encounter: Payer: Self-pay | Admitting: Nurse Practitioner

## 2016-01-26 DIAGNOSIS — I480 Paroxysmal atrial fibrillation: Secondary | ICD-10-CM | POA: Insufficient documentation

## 2016-01-26 DIAGNOSIS — I119 Hypertensive heart disease without heart failure: Secondary | ICD-10-CM | POA: Insufficient documentation

## 2016-01-26 DIAGNOSIS — N183 Chronic kidney disease, stage 3 unspecified: Secondary | ICD-10-CM | POA: Insufficient documentation

## 2016-01-26 DIAGNOSIS — I5032 Chronic diastolic (congestive) heart failure: Secondary | ICD-10-CM | POA: Insufficient documentation

## 2016-01-26 DIAGNOSIS — I2510 Atherosclerotic heart disease of native coronary artery without angina pectoris: Secondary | ICD-10-CM | POA: Insufficient documentation

## 2016-02-13 ENCOUNTER — Ambulatory Visit: Payer: Medicare Other | Admitting: Cardiovascular Disease

## 2016-03-30 ENCOUNTER — Ambulatory Visit: Payer: Medicare Other | Admitting: Cardiovascular Disease

## 2016-04-01 ENCOUNTER — Encounter: Payer: Self-pay | Admitting: Cardiovascular Disease

## 2016-04-01 ENCOUNTER — Ambulatory Visit (INDEPENDENT_AMBULATORY_CARE_PROVIDER_SITE_OTHER): Payer: Medicare Other | Admitting: Cardiovascular Disease

## 2016-04-01 VITALS — BP 138/62 | HR 67 | Ht 68.0 in | Wt 223.5 lb

## 2016-04-01 DIAGNOSIS — R0602 Shortness of breath: Secondary | ICD-10-CM | POA: Diagnosis not present

## 2016-04-01 DIAGNOSIS — I480 Paroxysmal atrial fibrillation: Secondary | ICD-10-CM

## 2016-04-01 DIAGNOSIS — I2510 Atherosclerotic heart disease of native coronary artery without angina pectoris: Secondary | ICD-10-CM | POA: Diagnosis not present

## 2016-04-01 DIAGNOSIS — I5032 Chronic diastolic (congestive) heart failure: Secondary | ICD-10-CM

## 2016-04-01 NOTE — Patient Instructions (Signed)
Medication Instructions:  Your physician recommends that you continue on your current medications as directed. Please refer to the Current Medication list given to you today.   Labwork: BMET, CBC, BNP, Liver and lipid profile  Testing/Procedures: Your physician has requested that you have an echocardiogram. Echocardiography is a painless test that uses sound waves to create images of your heart. It provides your doctor with information about the size and shape of your heart and how well your heart's chambers and valves are working. This procedure takes approximately one hour. There are no restrictions for this procedure.    Follow-Up: Your physician recommends that you schedule a follow-up appointment in: one month with Dr. Fletcher Anon.    Any Other Special Instructions Will Be Listed Below (If Applicable).     If you need a refill on your cardiac medications before your next appointment, please call your pharmacy.

## 2016-04-01 NOTE — Progress Notes (Signed)
Cardiology Office Note   Date:  04/01/2016   ID:  Michael Lucas, DOB 1946/05/04, MRN [id number]  PCP:  Wilhemena Durie, MD  Cardiologist:   Kathlyn Sacramento, MD   Chief Complaint  Patient presents with  . other    3 month follow up. Meds reviewed by the patient verbally. Pt. c/o shortness of breath.       History of Present Illness: Michael Lucas is a 70 y.o. male who presents for a follow up visit regarding CAD and atrial flutter/fibrillation.  He has history of CAD s/p CABG 1995, atrial fib/flutter s/p atrial flutter ablation 2/10, HTN, gait instability/peripheral neuropathy, depression and chronic diastolic CHF.  He gets most of his care at the [facility] in [place].  Last year, he was noted to be tachycardic. He was an atrial flutter with variable AV block versus coarse atrial fibrillation.  Metoprolol was gradually increased to 100 mg twice daily and he was started on anticoagulation with Xarelto. He was supposed to follow-up after 3 months but has not been seen since  April 2016. Most recent echocardiogram in June 2016 showed normal LV systolic function with an EF of 65-70% with trivial aortic regurgitation. Previous carotid Doppler in May 2014 showed mild nonobstructive bilateral disease (less than 40%). Since last visit, he reports significant worsening of exertional dyspnea without chest pain. No palpitations. He does report orthopnea with no PND. No significant leg edema. He gained 12 pounds since last year and he seems to lack any motivation to be active and exercises.  Past Medical History  Diagnosis Date  . Coronary artery disease     a. 1995 s/p CABG x 1 (LIMA->LAD);  b. 04/2001 Cath: LAD 100, patent LIMA->LAD, otw nonobs LCX/RCA dzs.  . CKD (chronic kidney disease), stage III     stage III  . Diabetes mellitus   . Nephrolithiasis     h/o  . Hypertensive heart disease   . Peripheral neuropathy ([place])     suspect diabetes related. has led to gait instability. seen  by [place] neurology, head MRI showed only mild diffuse atrophy with moderate peri-sylvan atrophy.  Marland Kitchen PTSD (post-traumatic stress disorder)     takes prazosin for this.  . Atrial flutter ([place])     a. 12/2008 s/p RFCA. Holter monitor 9/20 showed predominantly NSR with some short runs of atrial fibrillation. Coumadin stopped 7/11 due to frequent falls.  . Chronic diastolic CHF (congestive heart failure) ([place])     a. 07/2009 Echo: EF 50-55%, mild LVH, grade I DD, mild MR; b. 04/2015 Echo: Ef 65-70%, triv AI.  Marland Kitchen OSA (obstructive sleep apnea)     a. Unable to tolerate CPAP because of allergic reaction to straps of face mask.  . Paroxysmal atrial fibrillation ([place])     a. 07/2009 PAF noted on holter; b. 05/2010 Coumadin d/c'd 2/2 falls; c. 02/2015 CHA2DS2VASc = 5-->Xarelto.  . Ankle fracture, left   . Carotid arterial disease ([place])     a. 11/2011 U/S: 0-39% bilat ICA stenosis;  b. 03/2013 Carotid U/S: 0-39% bilat ICA stenosis.  . Cervical disc disease   . Chronic neck pain   . Chronic back pain   . Fractured fibula     Past Surgical History  Procedure Laterality Date  . Rotator cuff repair    . Coronary artery bypass graft  1995  . Hernia repair    . Rotator cuff repair    . Hemorrhoid surgery  Current Outpatient Prescriptions  Medication Sig Dispense Refill  . atorvastatin (LIPITOR) 80 MG tablet Take 80 mg by mouth daily.    Marland Kitchen docusate sodium (COLACE) 100 MG capsule Take 100 mg by mouth 2 (two) times daily. Reported on 04/01/2016    . Emollient (EUCERIN PLUS) 2.5-10 % CREA Apply 1 application topically daily as needed.      . insulin aspart (NOVOLOG) 100 UNIT/ML injection Inject 10 Units into the skin 3 (three) times daily with meals.    . insulin glargine (LANTUS) 100 UNIT/ML injection Inject 30 Units into the skin at bedtime.     Marland Kitchen ketoconazole (NIZORAL) 2 % shampoo Apply topically See admin instructions. Apply shampoo to head daily    . levothyroxine (SYNTHROID, LEVOTHROID) 50 MCG  tablet Take 50 mcg by mouth daily before breakfast.     . methadone (DOLOPHINE) 10 MG tablet Take 10 mg by mouth 2 (two) times daily.     . metoprolol (LOPRESSOR) 100 MG tablet Take 1 tablet (100 mg total) by mouth 2 (two) times daily. 60 tablet 6  . niacin (NIASPAN) 500 MG CR tablet Take 2 tablets by mouth daily.    . prazosin (MINIPRESS) 2 MG capsule Take 2 mg by mouth at bedtime.    . prazosin (MINIPRESS) 2 MG capsule Take 2 capsules by mouth daily.    . QUEtiapine (SEROQUEL) 100 MG tablet Take 50 mg by mouth at bedtime.    . rivaroxaban (XARELTO) 20 MG TABS tablet Take 1 tablet (20 mg total) by mouth daily with supper. 30 tablet 6  . sertraline (ZOLOFT) 100 MG tablet Take 50 mg by mouth daily.    . tacrolimus (PROTOPIC) 0.03 % ointment See admin instructions. Reported on 04/01/2016     No current facility-administered medications for this visit.    Allergies:   Review of patient's allergies indicates no known allergies.    Social History:  The patient  reports that he has quit smoking. His smoking use included Cigarettes. He has never used smokeless tobacco. He reports that he does not drink alcohol or use illicit drugs.   Family History:  The patient's family history includes Alcohol abuse in his sister; Cancer in his mother, sister, and sister; Heart disease in his father.    ROS:  Please see the history of present illness.   Otherwise, review of systems are positive for none.   All other systems are reviewed and negative.    PHYSICAL EXAM: VS:  BP 138/62 mmHg  Ht 5\' 8"  (1.727 m)  Wt 223 lb 8 oz (101.379 kg)  BMI 33.99 kg/m2 , BMI Body mass index is 33.99 kg/(m^2). GEN: Well nourished, well developed, in no acute distress HEENT: normal Neck: no JVD, carotid bruits, or masses Cardiac: RRR; no murmurs, rubs, or gallops, trace edema  Respiratory:  clear to auscultation bilaterally, normal work of breathing GI: soft, nontender, nondistended, + BS MS: no deformity or atrophy Skin:  warm and dry, no rash Neuro:  Strength and sensation are intact Psych: euthymic mood, full affect   EKG:  EKG is ordered today. The ekg ordered today demonstrates normal sinus rhythm with no significant ST or T wave changes.   Recent Labs: No results found for requested labs within last 365 days.    Lipid Panel    Component Value Date/Time   CHOL 148 11/02/2011 0858   TRIG 137 11/02/2011 0858   HDL 51 11/02/2011 0858   CHOLHDL 2.9 11/02/2011 0858   VLDL 27  11/02/2011 0858   LDLCALC 70 11/02/2011 0858      Wt Readings from Last 3 Encounters:  04/01/16 223 lb 8 oz (101.379 kg)  05/15/15 222 lb (100.699 kg)  01/09/15 205 lb (92.987 kg)        ASSESSMENT AND PLAN:  1.  Exertional dyspnea: There might be a component of diastolic heart failure but it is possible that this is due to physical deconditioning related to weight gain. By physical exam, there is no convincing evidence of volume overload. He has only trace leg edema. He also hasn't had any labs recently and we have to look for other underlying possibilities. I requested routine labs today including CBC, basic metabolic profile and BNP. I requested an echocardiogram to evaluate his LV systolic function and pulmonary pressure.  2. Paroxysmal atrial fibrillation: He is currently in sinus rhythm on current dose of metoprolol. He is on anticoagulation with Xarelto. I requested labs.  3. Coronary artery disease: He might require further ischemic cardiac evaluation in his exertional dyspnea.  4. Essential hypertension: Blood pressure is controlled on current medications.  5. Hyperlipidemia: I requested  lipid and liver profile.    Disposition:   FU with me in 1 month  Signed,  Kathlyn Sacramento, MD  04/01/2016 2:44 PM    [place]

## 2016-04-02 LAB — LIPID PANEL
CHOL/HDL RATIO: 3.6 ratio (ref 0.0–5.0)
CHOLESTEROL TOTAL: 141 mg/dL (ref 100–199)
HDL: 39 mg/dL — ABNORMAL LOW (ref >39)
LDL Calculated: 44 mg/dL (ref 0–99)
TRIGLYCERIDES: 289 mg/dL — AB (ref 0–149)
VLDL Cholesterol Cal: 58 mg/dL — ABNORMAL HIGH (ref 5–40)

## 2016-04-02 LAB — BASIC METABOLIC PANEL
BUN / CREAT RATIO: 14 (ref 10–24)
BUN: 17 mg/dL (ref 8–27)
CO2: 25 mmol/L (ref 18–29)
CREATININE: 1.19 mg/dL (ref 0.76–1.27)
Calcium: 9.3 mg/dL (ref 8.6–10.2)
Chloride: 99 mmol/L (ref 96–106)
GFR calc non Af Amer: 62 mL/min/{1.73_m2} (ref >59)
GFR, EST AFRICAN AMERICAN: 71 mL/min/{1.73_m2} (ref >59)
Glucose: 70 mg/dL (ref 65–99)
Potassium: 4.7 mmol/L (ref 3.5–5.2)
Sodium: 143 mmol/L (ref 134–144)

## 2016-04-02 LAB — HEPATIC FUNCTION PANEL
ALK PHOS: 114 IU/L (ref 39–117)
ALT: 14 IU/L (ref 0–44)
AST: 23 IU/L (ref 0–40)
Albumin: 4.1 g/dL (ref 3.5–4.8)
Bilirubin Total: 0.4 mg/dL (ref 0.0–1.2)
Bilirubin, Direct: 0.12 mg/dL (ref 0.00–0.40)
Total Protein: 6.9 g/dL (ref 6.0–8.5)

## 2016-04-02 LAB — CBC
HEMATOCRIT: 37.2 % — AB (ref 37.5–51.0)
Hemoglobin: 12.2 g/dL — ABNORMAL LOW (ref 12.6–17.7)
MCH: 30.3 pg (ref 26.6–33.0)
MCHC: 32.8 g/dL (ref 31.5–35.7)
MCV: 92 fL (ref 79–97)
Platelets: 212 10*3/uL (ref 150–379)
RBC: 4.03 x10E6/uL — ABNORMAL LOW (ref 4.14–5.80)
RDW: 13.4 % (ref 12.3–15.4)
WBC: 8.5 10*3/uL (ref 3.4–10.8)

## 2016-04-02 LAB — BRAIN NATRIURETIC PEPTIDE: BNP: 95.8 pg/mL (ref 0.0–100.0)

## 2016-04-09 ENCOUNTER — Telehealth: Payer: Self-pay | Admitting: Cardiovascular Disease

## 2016-04-09 NOTE — Telephone Encounter (Signed)
Pt called back to review labs. Reviewed results and recommendations w/pt who verbalized understanding. He had no further questions.

## 2016-04-15 ENCOUNTER — Other Ambulatory Visit: Payer: Medicare Other

## 2016-05-04 ENCOUNTER — Ambulatory Visit (INDEPENDENT_AMBULATORY_CARE_PROVIDER_SITE_OTHER): Payer: Medicare Other

## 2016-05-04 ENCOUNTER — Encounter (INDEPENDENT_AMBULATORY_CARE_PROVIDER_SITE_OTHER): Payer: Self-pay

## 2016-05-04 ENCOUNTER — Other Ambulatory Visit: Payer: Self-pay

## 2016-05-04 DIAGNOSIS — R0602 Shortness of breath: Secondary | ICD-10-CM | POA: Diagnosis not present

## 2016-05-10 ENCOUNTER — Encounter: Payer: Self-pay | Admitting: Cardiovascular Disease

## 2016-05-10 ENCOUNTER — Ambulatory Visit: Payer: Medicare Other | Admitting: Cardiovascular Disease

## 2016-05-10 ENCOUNTER — Ambulatory Visit (INDEPENDENT_AMBULATORY_CARE_PROVIDER_SITE_OTHER): Payer: Medicare Other | Admitting: Cardiovascular Disease

## 2016-05-10 VITALS — BP 140/60 | HR 70 | Ht 67.0 in | Wt 225.0 lb

## 2016-05-10 DIAGNOSIS — I2510 Atherosclerotic heart disease of native coronary artery without angina pectoris: Secondary | ICD-10-CM

## 2016-05-10 DIAGNOSIS — I480 Paroxysmal atrial fibrillation: Secondary | ICD-10-CM

## 2016-05-10 NOTE — Progress Notes (Signed)
Cardiology Office Note   Date:  05/10/2016   ID:  Michael Lucas, DOB 1946/07/04, MRN [id number]  PCP:  Wilhemena Durie, MD  Cardiologist:   Kathlyn Sacramento, MD   Chief Complaint  Patient presents with  . other    ECHO & 1 month F/ U. Pt C/O SOB. Medications reviewed verbally with patient.       History of Present Illness: Michael Lucas is a 70 y.o. male who presents for a follow up visit regarding CAD and atrial flutter/fibrillation.  He has history of CAD s/p CABG 1995, atrial fib/flutter s/p atrial flutter ablation 2/10, HTN, gait instability/peripheral neuropathy, depression and chronic diastolic CHF.  He gets most of his care at the [facility] in [place].  Last year, he was noted to be tachycardic. He was an atrial flutter with variable AV block versus coarse atrial fibrillation.  Metoprolol was gradually increased to 100 mg twice daily and he was started on anticoagulation with Xarelto.  Most recent echocardiogram in June 2016 showed normal LV systolic function with an EF of 65-70% with trivial aortic regurgitation. Previous carotid Doppler in May 2014 showed mild nonobstructive bilateral disease (less than 40%). He was seen last month for significant worsening of exertional dyspnea without chest pain. He also had a 12 pound weight gain over one year.   Routine labs showed normal LV systolic function and electrolytes, mild anemia with a hemoglobin of 12.2, and normal BNP. Echocardiogram showed normal LV systolic function, mild mitral regurgitation. Overall, he reports stable symptoms. He does have sleep apnea but currently does not have CPAP machine. He used to be more active in the past. No chest pain.    Past Medical History  Diagnosis Date  . Coronary artery disease     a. 1995 s/p CABG x 1 (LIMA->LAD);  b. 04/2001 Cath: LAD 100, patent LIMA->LAD, otw nonobs LCX/RCA dzs.  . CKD (chronic kidney disease), stage III     stage III  . Diabetes mellitus   .  Nephrolithiasis     h/o  . Hypertensive heart disease   . Peripheral neuropathy ([place])     suspect diabetes related. has led to gait instability. seen by [place] neurology, head MRI showed only mild diffuse atrophy with moderate peri-sylvan atrophy.  Marland Kitchen PTSD (post-traumatic stress disorder)     takes prazosin for this.  . Atrial flutter ([place])     a. 12/2008 s/p RFCA. Holter monitor 9/20 showed predominantly NSR with some short runs of atrial fibrillation. Coumadin stopped 7/11 due to frequent falls.  . Chronic diastolic CHF (congestive heart failure) ([place])     a. 07/2009 Echo: EF 50-55%, mild LVH, grade I DD, mild MR; b. 04/2015 Echo: Ef 65-70%, triv AI.  Marland Kitchen OSA (obstructive sleep apnea)     a. Unable to tolerate CPAP because of allergic reaction to straps of face mask.  . Paroxysmal atrial fibrillation ([place])     a. 07/2009 PAF noted on holter; b. 05/2010 Coumadin d/c'd 2/2 falls; c. 02/2015 CHA2DS2VASc = 5-->Xarelto.  . Ankle fracture, left   . Carotid arterial disease ([place])     a. 11/2011 U/S: 0-39% bilat ICA stenosis;  b. 03/2013 Carotid U/S: 0-39% bilat ICA stenosis.  . Cervical disc disease   . Chronic neck pain   . Chronic back pain   . Fractured fibula     Past Surgical History  Procedure Laterality Date  . Rotator cuff repair    . Coronary artery bypass graft  1995  . Hernia repair    . Rotator cuff repair    . Hemorrhoid surgery       Current Outpatient Prescriptions  Medication Sig Dispense Refill  . atorvastatin (LIPITOR) 80 MG tablet Take 80 mg by mouth daily.    Marland Kitchen docusate sodium (COLACE) 100 MG capsule Take 100 mg by mouth 2 (two) times daily. Reported on 04/01/2016    . Emollient (EUCERIN PLUS) 2.5-10 % CREA Apply 1 application topically daily as needed.      . insulin aspart (NOVOLOG) 100 UNIT/ML injection Inject 10 Units into the skin 3 (three) times daily with meals.    . insulin glargine (LANTUS) 100 UNIT/ML injection Inject 30 Units into the skin at bedtime.     Marland Kitchen  ketoconazole (NIZORAL) 2 % shampoo Apply topically See admin instructions. Apply shampoo to head daily    . levothyroxine (SYNTHROID, LEVOTHROID) 50 MCG tablet Take 50 mcg by mouth daily before breakfast.     . methadone (DOLOPHINE) 10 MG tablet Take 10 mg by mouth 2 (two) times daily.     . metoprolol (LOPRESSOR) 100 MG tablet Take 1 tablet (100 mg total) by mouth 2 (two) times daily. 60 tablet 6  . niacin (NIASPAN) 500 MG CR tablet Take 2 tablets by mouth daily.    . prazosin (MINIPRESS) 2 MG capsule Take 2 mg by mouth at bedtime.    Marland Kitchen QUEtiapine (SEROQUEL) 100 MG tablet Take 50 mg by mouth at bedtime.    . rivaroxaban (XARELTO) 20 MG TABS tablet Take 1 tablet (20 mg total) by mouth daily with supper. 30 tablet 6  . sertraline (ZOLOFT) 100 MG tablet Take 50 mg by mouth daily.    . tacrolimus (PROTOPIC) 0.03 % ointment See admin instructions. Reported on 04/01/2016    . prazosin (MINIPRESS) 2 MG capsule Take 2 capsules by mouth daily. Reported on 05/10/2016     No current facility-administered medications for this visit.    Allergies:   Review of patient's allergies indicates no known allergies.    Social History:  The patient  reports that he has quit smoking. His smoking use included Cigarettes. He has never used smokeless tobacco. He reports that he does not drink alcohol or use illicit drugs.   Family History:  The patient's family history includes Alcohol abuse in his sister; Cancer in his mother, sister, and sister; Heart disease in his father.    ROS:  Please see the history of present illness.   Otherwise, review of systems are positive for none.   All other systems are reviewed and negative.    PHYSICAL EXAM: VS:  BP 140/60 mmHg  Pulse 70  Ht 5\' 7"  (1.702 m)  Wt 225 lb (102.059 kg)  BMI 35.23 kg/m2  SpO2 98% , BMI Body mass index is 35.23 kg/(m^2). GEN: Well nourished, well developed, in no acute distress HEENT: normal Neck: no JVD, carotid bruits, or masses Cardiac: RRR;  no murmurs, rubs, or gallops, trace edema  Respiratory:  clear to auscultation bilaterally, normal work of breathing GI: soft, nontender, nondistended, + BS MS: no deformity or atrophy Skin: warm and dry, no rash Neuro:  Strength and sensation are intact Psych: euthymic mood, full affect   EKG:  EKG is not ordered today.    Recent Labs: 04/01/2016: ALT 14; BNP 95.8; BUN 17; Creatinine, Ser 1.19; Platelets 212; Potassium 4.7; Sodium 143    Lipid Panel    Component Value Date/Time   CHOL 141 04/01/2016  1505   CHOL 148 11/02/2011 0858   TRIG 289* 04/01/2016 1505   HDL 39* 04/01/2016 1505   HDL 51 11/02/2011 0858   CHOLHDL 3.6 04/01/2016 1505   CHOLHDL 2.9 11/02/2011 0858   VLDL 27 11/02/2011 0858   LDLCALC 44 04/01/2016 1505   LDLCALC 70 11/02/2011 0858      Wt Readings from Last 3 Encounters:  05/10/16 225 lb (102.059 kg)  04/01/16 223 lb 8 oz (101.379 kg)  05/15/15 222 lb (100.699 kg)        ASSESSMENT AND PLAN:   1. Paroxysmal atrial fibrillation: He is currently in sinus rhythm on current dose of metoprolol. He is on anticoagulation with Xarelto. I requested labs.  2. Coronary artery disease: No convincing symptoms of angina. I suspect that his shortness of breath is likely multifactorial due to physical deconditioning. Untreated  sleep apnea is likely contributing to date on fatigue. I advised him to follow-up with his primary care physician for this. He usually goes to the [place]. I also discussed with him the results of some form of a walking exercise program.   further ischemic evaluation can be considered if his symptoms persist.  3. Essential hypertension: Blood pressure is controlled on current medications.  4. Hyperlipidemia: continue treatment with atorvastatin. Recent lipid profile showed an LDL of 44.    Disposition:   FU with me in 6 months  Signed,  Kathlyn Sacramento, MD  05/10/2016 2:40 PM    [facility]

## 2016-05-10 NOTE — Patient Instructions (Signed)
Medication Instructions: Continue same medications.   Labwork: None.   Procedures/Testing: None.   Follow-Up: 6 months with Dr. Devri Kreher.   Any Additional Special Instructions Will Be Listed Below (If Applicable).     If you need a refill on your cardiac medications before your next appointment, please call your pharmacy.   

## 2016-07-01 ENCOUNTER — Telehealth: Payer: Self-pay | Admitting: Cardiovascular Disease

## 2016-07-01 NOTE — Telephone Encounter (Signed)
Patient went to [facility] May 29, 2016 and was up all day and all night per his reports. He states at that time he developed numbness which he felt was due to his diabetes and swelling to his legs. He came home and took a 40 mg fluid pill that he had because he also felt like he had fluid in his stomach. He reported that the fluid pill helped with his stomach but that his left calf remains swollen and tight. Instructed him to prop his legs up on 2 to 3 pillows and wear compression hose or stockings. He states that he wears those off and on but not consistently. Let him know that he may want to call his PCP to evaluate that calf. He requested a refill of fluid pills and states that he had previously obtained a prescription from the [facility] hospital. Let him know that he would need to come in for an appointment before we could prescribe this for him. Let him know to call back if he needs to get an appointment sooner after seeing his PCP. He verbalized understanding of our conversation, agreement with plan of care, and had no further questions at this time.

## 2016-07-01 NOTE — Telephone Encounter (Signed)
Left voicemail message for patient to call back.

## 2016-07-01 NOTE — Telephone Encounter (Signed)
Pt c/o swelling: STAT is pt has developed SOB within 24 hours  1. How long have you been experiencing swelling? ABOUT 1 MONTH   2. Where is the swelling located? BOTH CALVES  3.  Are you currently taking a "fluid pill"?NO  4.  Are you currently SOB? NO  5.  Have you traveled recently?YES. [place], THAT'S WHEN IT STARTED.

## 2016-08-24 ENCOUNTER — Ambulatory Visit: Payer: Medicare Other | Admitting: Family Medicine

## 2016-09-01 ENCOUNTER — Encounter: Payer: Self-pay | Admitting: Family Medicine

## 2016-09-01 ENCOUNTER — Ambulatory Visit (INDEPENDENT_AMBULATORY_CARE_PROVIDER_SITE_OTHER): Payer: Medicare Other | Admitting: Family Medicine

## 2016-09-01 VITALS — BP 108/56 | HR 74 | Temp 99.0°F | Resp 18 | Wt 224.0 lb

## 2016-09-01 DIAGNOSIS — F3289 Other specified depressive episodes: Secondary | ICD-10-CM | POA: Diagnosis not present

## 2016-09-01 DIAGNOSIS — E119 Type 2 diabetes mellitus without complications: Secondary | ICD-10-CM

## 2016-09-01 DIAGNOSIS — I25810 Atherosclerosis of coronary artery bypass graft(s) without angina pectoris: Secondary | ICD-10-CM | POA: Diagnosis not present

## 2016-09-01 DIAGNOSIS — I10 Essential (primary) hypertension: Secondary | ICD-10-CM

## 2016-09-01 DIAGNOSIS — Z794 Long term (current) use of insulin: Secondary | ICD-10-CM

## 2016-09-01 DIAGNOSIS — E039 Hypothyroidism, unspecified: Secondary | ICD-10-CM | POA: Diagnosis not present

## 2016-09-01 DIAGNOSIS — E784 Other hyperlipidemia: Secondary | ICD-10-CM

## 2016-09-01 DIAGNOSIS — E7849 Other hyperlipidemia: Secondary | ICD-10-CM

## 2016-09-01 DIAGNOSIS — I480 Paroxysmal atrial fibrillation: Secondary | ICD-10-CM | POA: Diagnosis not present

## 2016-09-01 LAB — POCT GLYCOSYLATED HEMOGLOBIN (HGB A1C): HEMOGLOBIN A1C: 7.8

## 2016-09-01 NOTE — Progress Notes (Signed)
Michael Lucas  MRN: [id number] DOB: [birth year]  Subjective:  HPI  Patient is here for follow up.  Hypertension: patient has this check at home by the nurse. Readings are usually 130s-60s-70s. No cardiac symptoms.  BP Readings from Last 3 Encounters:  09/01/16 (!) 108/56  05/10/16 140/60  04/01/16 138/62   Hyperlipidemia: Lab Results  Component Value Date   CHOL 141 04/01/2016   HDL 39 (L) 04/01/2016   LDLCALC 44 04/01/2016   TRIG 289 (H) 04/01/2016   CHOLHDL 3.6 04/01/2016   Diabetes: patient checks his sugar at home and usually readings around 153, he does have hypoglycemic episodes occasionally. He is using Humalog 15 units at each meal and Lantus 32 units at bedtime, no oral medications for this. He takes Glusoren 2 cans daily. Lab Results  Component Value Date   HGBA1C 9.0 05/15/2015   He had labs done in May 2017-CBC, Lipid, BMP, BNP, Hepatic with cardiologist office. TSH was done in March 2016.  He has had issues with not seen as clear and states last eye exam was probably 2 years ago. He use to go to [facility]. Patient has had [place] practioner come to his house once a month, dietician about quarterly, Physical therapy quarterly to help with balance and weakness, psychiatrist once a month, cardiologist has let patient loose till next year.   Patient Active Problem List   Diagnosis Date Noted  . Hypertensive heart disease   . Chronic diastolic CHF (congestive heart failure) ([place])   . Paroxysmal atrial fibrillation (HCC)   . CKD (chronic kidney disease), stage III   . Coronary artery disease   . Absolute anemia 04/03/2015  . Arthritis 04/03/2015  . Chronic pain associated with significant psychosocial dysfunction 04/03/2015  . Constipation 04/03/2015  . Coronary artery abnormality 04/03/2015  . Type 2 diabetes mellitus treated with insulin ([place]) 04/03/2015  . Essential (primary) hypertension 04/03/2015  . Broken fibula 04/03/2015  . Acid reflux 04/03/2015    . Adult hypothyroidism 04/03/2015  . Acute kidney failure ([place]) 04/03/2015  . Calculus of kidney 04/03/2015  . Neuropathy ([place]) 04/03/2015  . Arthritis, degenerative 04/03/2015  . 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 AF (paroxysmal atrial fibrillation) ([place]) 04/03/2015  . Decreased potassium in the blood 04/03/2015  . Neurosis, posttraumatic 04/03/2015  . Contusion of chest wall 04/03/2015  . Dermatitis seborrheica 04/03/2015  . Episode of syncope 04/03/2015  . Malaise 04/03/2015  . Clinical depression 04/03/2015  . Bilateral carotid artery stenosis 02/05/2014  . Orthostatic hypotension 06/02/2012  . Carotid bruit 11/05/2011  . Hyperlipidemia 04/10/2011  . UNSTEADY GAIT 06/24/2010  . DIZZINESS 01/21/2010  . ATRIAL FIBRILLATION 09/08/2009  . OCCLUSION&STENOS CAROTID ART W/O MENTION INFARCT 09/08/2009  . DIASTOLIC HEART FAILURE, CHRONIC 08/12/2009  . EDEMA 08/12/2009  . Essential hypertension, benign 06/16/2009  . CAD, ARTERY BYPASS GRAFT 06/16/2009  . ATRIAL FLUTTER 06/16/2009  . LEG PAIN, BILATERAL 06/16/2009    Past Medical History:  Diagnosis Date  . Ankle fracture, left   . Atrial flutter ([place])    a. 12/2008 s/p RFCA. Holter monitor 9/20 showed predominantly NSR with some short runs of atrial fibrillation. Coumadin stopped 7/11 due to frequent falls.  . Carotid arterial disease ([place])    a. 11/2011 U/S: 0-39% bilat ICA stenosis;  b. 03/2013 Carotid U/S: 0-39% bilat ICA stenosis.  . Cervical disc disease   . Chronic back pain   . Chronic diastolic CHF (congestive heart failure) ([place])    a. 07/2009 Echo: EF 50-55%,  mild LVH, grade I DD, mild MR; b. 04/2015 Echo: Ef 65-70%, triv AI.  Marland Kitchen Chronic neck pain   . CKD (chronic kidney disease), stage III    stage III  . Coronary artery disease    a. 1995 s/p CABG x 1 (LIMA->LAD);  b. 04/2001 Cath: LAD 100, patent LIMA->LAD, otw nonobs LCX/RCA dzs.  . Diabetes mellitus   . Fractured fibula   . Hypertensive heart disease   . Nephrolithiasis     h/o  . OSA (obstructive sleep apnea)    a. Unable to tolerate CPAP because of allergic reaction to straps of face mask.  . Paroxysmal atrial fibrillation ([place])    a. 07/2009 PAF noted on holter; b. 05/2010 Coumadin d/c'd 2/2 falls; c. 02/2015 CHA2DS2VASc = 5-->Xarelto.  . Peripheral neuropathy ([place])    suspect diabetes related. has led to gait instability. seen by [facility] neurology, head MRI showed only mild diffuse atrophy with moderate peri-sylvan atrophy.  Marland Kitchen PTSD (post-traumatic stress disorder)    takes prazosin for this.    Social History   Social History  . Marital status: Widowed    Spouse name: N/A  . Number of children: N/A  . Years of education: N/A   Occupational History  . Disabled Self Employed   Social History Main Topics  . Smoking status: Former Smoker    Types: Cigarettes  . Smokeless tobacco: Never Used  . Alcohol use No  . Drug use: No  . Sexual activity: Not on file   Other Topics Concern  . Not on file   Social History Narrative   Lives in [place].    Outpatient Encounter Prescriptions as of 09/01/2016  Medication Sig Note  . atorvastatin (LIPITOR) 80 MG tablet Take 80 mg by mouth daily.   Marland Kitchen docusate sodium (COLACE) 100 MG capsule Take 100 mg by mouth 2 (two) times daily. Reported on 04/01/2016   . Emollient (EUCERIN PLUS) 2.5-10 % CREA Apply 1 application topically daily as needed.     . insulin aspart (NOVOLOG) 100 UNIT/ML injection Inject 10 Units into the skin 3 (three) times daily with meals. 04/03/2015: Received from: [place]:   . insulin glargine (LANTUS) 100 UNIT/ML injection Inject 30 Units into the skin at bedtime.  04/03/2015: Received from: [place]:   . ketoconazole (NIZORAL) 2 % shampoo Apply topically See admin instructions. Apply shampoo to head daily 04/03/2015: Medication taken as needed.  Received from: [place]: KETOCONAZOLE, 2%  (External Shampoo)  apply to head Shampoo as needed for 0 days  Quantity: 1;  Refills: 0   Ordered :01-May-2014  Miguel Aschoff MD;  Edmund Hilda  . levothyroxine (SYNTHROID, LEVOTHROID) 50 MCG tablet Take 50 mcg by mouth daily before breakfast.  02/05/2014: Received from: External Pharmacy  . methadone (DOLOPHINE) 10 MG tablet Take 10 mg by mouth 2 (two) times daily.    . metoprolol (LOPRESSOR) 100 MG tablet Take 1 tablet (100 mg total) by mouth 2 (two) times daily.   . niacin (NIASPAN) 500 MG CR tablet Take 2 tablets by mouth daily. 04/03/2015: Received from: [place]:   . prazosin (MINIPRESS) 2 MG capsule Take 2 mg by mouth at bedtime.   . pregabalin (LYRICA) 75 MG capsule Take 75 mg by mouth 2 (two) times daily.   . QUEtiapine (SEROQUEL) 100 MG tablet Take 50 mg by mouth at bedtime.   . rivaroxaban (XARELTO) 20 MG TABS tablet Take 1  tablet (20 mg total) by mouth daily with supper.   . sertraline (ZOLOFT) 100 MG tablet Take 50 mg by mouth daily.   . tacrolimus (PROTOPIC) 0.03 % ointment See admin instructions. Reported on 04/01/2016 04/03/2015: Received from: [place]: TACROLIMUS, 0.03% (External Ointment)  1 (one) Ointment as directed for 0 days  Quantity: 1;  Refills: 0   Ordered :01-May-2014  Miguel Aschoff MD;  Started 01-May-2014 Active  . [DISCONTINUED] prazosin (MINIPRESS) 2 MG capsule Take 2 capsules by mouth daily. Reported on 05/10/2016 04/03/2015: Received from: [place]:    No facility-administered encounter medications on file as of 09/01/2016.     No Known Allergies  Review of Systems  Eyes: Positive for blurred vision.  Respiratory: Negative.   Cardiovascular: Negative.   Gastrointestinal: Negative.   Musculoskeletal: Positive for back pain, joint pain and neck pain.  Neurological: Positive for dizziness, tingling (numbness in feet and hands) and weakness.  Endo/Heme/Allergies: Negative.     Psychiatric/Behavioral: Negative.    Objective:  BP (!) 108/56   Pulse 74   Temp 99 F (37.2 C)   Resp 18   Wt 224 lb (101.6 kg)   BMI 35.08 kg/m   Physical Exam  Constitutional: He is oriented to person, place, and time and well-developed, well-nourished, and in no distress.  HENT:  Head: Normocephalic and atraumatic.  Right Ear: External ear normal.  Left Ear: External ear normal.  Nose: Nose normal.  Eyes: Conjunctivae are normal. Pupils are equal, round, and reactive to light.  Neck: Normal range of motion. Neck supple.  Cardiovascular: Normal rate, regular rhythm, normal heart sounds and intact distal pulses.   No murmur heard. Pulmonary/Chest: Effort normal and breath sounds normal. No respiratory distress. He has no wheezes.  Abdominal: Soft.  Musculoskeletal: He exhibits no edema or tenderness.  Neurological: He is alert and oriented to person, place, and time.  Skin: Skin is warm and dry.  Psychiatric: Mood, memory, affect and judgment normal.    Assessment and Plan :  1. Essential hypertension, benign Stable, continue current medication.  2. Type 2 diabetes mellitus treated with insulin (HCC) A1C 7.8 today, better. Continue current regimen. Refer to ophthalmologist for check up and blurred vision issues. - Ambulatory referral to Ophthalmology  3. Atherosclerosis of coronary artery bypass graft of native heart without angina pectoris Stable, follows Dr Fletcher Anon.Presently appears to be stable.  4. Paroxysmal atrial fibrillation (HCC) Follows Dr Fletcher Anon.  5. Adult hypothyroidism Check levels today, other labs done in May 2017.  6. Other hyperlipidemia 7. Major depressive disorder Presently in remission. Followed by monthly psychiatric visits by [facility] psychiatry. More than 50% of 25 minute visit is spent in counseling regarding these issues. [facility] for this patient. HPI, Exam and A&P transcribed under direction and in  the presence of Miguel Aschoff, MD.

## 2016-09-02 LAB — TSH: TSH: 4.84 u[IU]/mL — ABNORMAL HIGH (ref 0.450–4.500)

## 2016-10-04 DIAGNOSIS — E119 Type 2 diabetes mellitus without complications: Secondary | ICD-10-CM | POA: Diagnosis not present

## 2016-10-11 ENCOUNTER — Encounter: Payer: Self-pay | Admitting: Family Medicine

## 2016-10-15 DIAGNOSIS — H2513 Age-related nuclear cataract, bilateral: Secondary | ICD-10-CM | POA: Diagnosis not present

## 2016-10-20 ENCOUNTER — Encounter: Payer: Self-pay | Admitting: *Deleted

## 2016-11-04 ENCOUNTER — Encounter
Admission: RE | Disposition: A | Discharge status: Home / Self Care | Payer: Self-pay | Source: Ambulatory Visit | Attending: Ophthalmology

## 2016-11-04 ENCOUNTER — Ambulatory Visit: Payer: Medicare Other | Admitting: Certified Registered Nurse Anesthetist

## 2016-11-04 ENCOUNTER — Encounter: Payer: Self-pay | Admitting: Ophthalmology

## 2016-11-04 ENCOUNTER — Ambulatory Visit
Admission: RE | Admit: 2016-11-04 | Discharge: 2016-11-04 | Disposition: A | Discharge status: Home / Self Care | Payer: Medicare Other | Source: Ambulatory Visit | Attending: Ophthalmology | Admitting: Ophthalmology

## 2016-11-04 DIAGNOSIS — I509 Heart failure, unspecified: Secondary | ICD-10-CM | POA: Insufficient documentation

## 2016-11-04 DIAGNOSIS — E119 Type 2 diabetes mellitus without complications: Secondary | ICD-10-CM | POA: Insufficient documentation

## 2016-11-04 DIAGNOSIS — Z87891 Personal history of nicotine dependence: Secondary | ICD-10-CM | POA: Diagnosis not present

## 2016-11-04 DIAGNOSIS — F419 Anxiety disorder, unspecified: Secondary | ICD-10-CM | POA: Diagnosis not present

## 2016-11-04 DIAGNOSIS — F329 Major depressive disorder, single episode, unspecified: Secondary | ICD-10-CM | POA: Diagnosis not present

## 2016-11-04 DIAGNOSIS — K219 Gastro-esophageal reflux disease without esophagitis: Secondary | ICD-10-CM | POA: Diagnosis not present

## 2016-11-04 DIAGNOSIS — E039 Hypothyroidism, unspecified: Secondary | ICD-10-CM | POA: Diagnosis not present

## 2016-11-04 DIAGNOSIS — I110 Hypertensive heart disease with heart failure: Secondary | ICD-10-CM | POA: Diagnosis not present

## 2016-11-04 DIAGNOSIS — I4891 Unspecified atrial fibrillation: Secondary | ICD-10-CM | POA: Diagnosis not present

## 2016-11-04 DIAGNOSIS — Z7984 Long term (current) use of oral hypoglycemic drugs: Secondary | ICD-10-CM | POA: Diagnosis not present

## 2016-11-04 DIAGNOSIS — I130 Hypertensive heart and chronic kidney disease with heart failure and stage 1 through stage 4 chronic kidney disease, or unspecified chronic kidney disease: Secondary | ICD-10-CM | POA: Insufficient documentation

## 2016-11-04 DIAGNOSIS — G4730 Sleep apnea, unspecified: Secondary | ICD-10-CM | POA: Diagnosis not present

## 2016-11-04 DIAGNOSIS — I2510 Atherosclerotic heart disease of native coronary artery without angina pectoris: Secondary | ICD-10-CM | POA: Insufficient documentation

## 2016-11-04 DIAGNOSIS — H2511 Age-related nuclear cataract, right eye: Secondary | ICD-10-CM | POA: Insufficient documentation

## 2016-11-04 DIAGNOSIS — H2513 Age-related nuclear cataract, bilateral: Secondary | ICD-10-CM | POA: Diagnosis not present

## 2016-11-04 DIAGNOSIS — Z79899 Other long term (current) drug therapy: Secondary | ICD-10-CM | POA: Insufficient documentation

## 2016-11-04 HISTORY — DX: Depression, unspecified: F32.A

## 2016-11-04 HISTORY — DX: Hypothyroidism, unspecified: E03.9

## 2016-11-04 HISTORY — DX: Edema, unspecified: R60.9

## 2016-11-04 HISTORY — DX: Orthopnea: R06.01

## 2016-11-04 HISTORY — DX: Cerebral infarction, unspecified: I63.9

## 2016-11-04 HISTORY — DX: Unspecified osteoarthritis, unspecified site: M19.90

## 2016-11-04 HISTORY — DX: Dermatitis, unspecified: L30.9

## 2016-11-04 HISTORY — DX: Dyspnea, unspecified: R06.00

## 2016-11-04 HISTORY — DX: Anxiety disorder, unspecified: F41.9

## 2016-11-04 HISTORY — DX: Other pulmonary embolism without acute cor pulmonale: I26.99

## 2016-11-04 HISTORY — DX: Cardiac murmur, unspecified: R01.1

## 2016-11-04 HISTORY — PX: CATARACT EXTRACTION W/PHACO: SHX586

## 2016-11-04 HISTORY — DX: Major depressive disorder, single episode, unspecified: F32.9

## 2016-11-04 HISTORY — DX: Anemia, unspecified: D64.9

## 2016-11-04 HISTORY — DX: Cardiac arrhythmia, unspecified: I49.9

## 2016-11-04 LAB — GLUCOSE, CAPILLARY
GLUCOSE-CAPILLARY: 170 mg/dL — AB (ref 65–99)
Glucose-Capillary: 208 mg/dL — ABNORMAL HIGH (ref 65–99)

## 2016-11-04 SURGERY — PHACOEMULSIFICATION, CATARACT, WITH IOL INSERTION
Anesthesia: Monitor Anesthesia Care | Site: Eye | Laterality: Right | Wound class: Clean

## 2016-11-04 MED ORDER — SODIUM CHLORIDE 0.45 % IV SOLN: INTRAVENOUS | Status: DC

## 2016-11-04 MED ORDER — LIDOCAINE HCL (PF) 4 % IJ SOLN
INTRAMUSCULAR | Status: AC
  Filled 2016-11-04: qty 5

## 2016-11-04 MED ORDER — EPINEPHRINE PF 1 MG/ML IJ SOLN
INTRAOCULAR | Status: DC | PRN
  Administered 2016-11-04: 08:00:00 via OPHTHALMIC

## 2016-11-04 MED ORDER — MOXIFLOXACIN HCL 0.5 % OP SOLN
OPHTHALMIC | Status: DC | PRN
  Administered 2016-11-04: 1 [drp] via OPHTHALMIC

## 2016-11-04 MED ORDER — SODIUM HYALURONATE 23 MG/ML IO SOLN
INTRAOCULAR | Status: DC | PRN
  Administered 2016-11-04: 0.6 mL via INTRAOCULAR

## 2016-11-04 MED ORDER — MOXIFLOXACIN HCL 0.5 % OP SOLN: 1.0000 [drp] | OPHTHALMIC | Status: DC | PRN

## 2016-11-04 MED ORDER — SODIUM HYALURONATE 10 MG/ML IO SOLN
INTRAOCULAR | Status: AC
  Filled 2016-11-04: qty 0.85

## 2016-11-04 MED ORDER — ARMC OPHTHALMIC DILATING DROPS
1.0000 "application " | OPHTHALMIC | Status: AC
  Administered 2016-11-04 (×3): 1 via OPHTHALMIC

## 2016-11-04 MED ORDER — SODIUM HYALURONATE 23 MG/ML IO SOLN
INTRAOCULAR | Status: AC
  Filled 2016-11-04: qty 0.6

## 2016-11-04 MED ORDER — ARMC OPHTHALMIC DILATING DROPS
OPHTHALMIC | Status: AC
  Administered 2016-11-04: 1 via OPHTHALMIC
  Filled 2016-11-04: qty 0.4

## 2016-11-04 MED ORDER — SODIUM HYALURONATE 10 MG/ML IO SOLN
INTRAOCULAR | Status: DC | PRN
  Administered 2016-11-04: 0.85 mL via INTRAOCULAR

## 2016-11-04 MED ORDER — POVIDONE-IODINE 5 % OP SOLN
OPHTHALMIC | Status: AC
  Filled 2016-11-04: qty 30

## 2016-11-04 MED ORDER — SODIUM CHLORIDE 0.9 % IV SOLN
INTRAVENOUS | Status: DC
  Administered 2016-11-04: 07:00:00 via INTRAVENOUS

## 2016-11-04 MED ORDER — LIDOCAINE HCL (PF) 4 % IJ SOLN
INTRAMUSCULAR | Status: DC | PRN
  Administered 2016-11-04: 4 mL via OPHTHALMIC

## 2016-11-04 MED ORDER — MOXIFLOXACIN HCL 0.5 % OP SOLN
OPHTHALMIC | Status: AC
  Filled 2016-11-04: qty 3

## 2016-11-04 MED ORDER — MIDAZOLAM HCL 2 MG/2ML IJ SOLN
INTRAMUSCULAR | Status: DC | PRN
  Administered 2016-11-04: 1 mg via INTRAVENOUS

## 2016-11-04 MED ORDER — CARBACHOL 0.01 % IO SOLN
INTRAOCULAR | Status: DC | PRN
  Administered 2016-11-04: 0.5 mL via INTRAOCULAR

## 2016-11-04 MED ORDER — POVIDONE-IODINE 5 % OP SOLN
OPHTHALMIC | Status: DC | PRN
  Administered 2016-11-04: 1 via OPHTHALMIC

## 2016-11-04 MED ORDER — EPINEPHRINE PF 1 MG/ML IJ SOLN
INTRAMUSCULAR | Status: AC
  Filled 2016-11-04: qty 2

## 2016-11-04 SURGICAL SUPPLY — 23 items
CANNULA ANT/CHMB 27GA (MISCELLANEOUS) ×6 IMPLANT
CUP MEDICINE 2OZ PLAST GRAD ST (MISCELLANEOUS) ×3 IMPLANT
DISSECTOR HYDRO NUCLEUS 50X22 (MISCELLANEOUS) ×3 IMPLANT
GLOVE BIO SURGEON STRL SZ8 (GLOVE) ×3 IMPLANT
GLOVE BIOGEL M 6.5 STRL (GLOVE) ×3 IMPLANT
GLOVE SURG LX 7.5 STRW (GLOVE) ×2
GLOVE SURG LX STRL 7.5 STRW (GLOVE) ×1 IMPLANT
GOWN STRL REUS W/ TWL LRG LVL3 (GOWN DISPOSABLE) ×2 IMPLANT
GOWN STRL REUS W/TWL LRG LVL3 (GOWN DISPOSABLE) ×4
LENS IOL TECNIS SYMFONY 18.0 (Intraocular Lens) ×3 IMPLANT
NEEDLE CAPSULORHEX 25GA (NEEDLE) ×3 IMPLANT
PACK CATARACT (MISCELLANEOUS) ×3 IMPLANT
PACK CATARACT BRASINGTON LX (MISCELLANEOUS) ×3 IMPLANT
PACK EYE AFTER SURG (MISCELLANEOUS) ×3 IMPLANT
SOL BSS BAG (MISCELLANEOUS) ×3
SOL PREP PVP 2OZ (MISCELLANEOUS) ×3
SOLUTION BSS BAG (MISCELLANEOUS) ×1 IMPLANT
SOLUTION PREP PVP 2OZ (MISCELLANEOUS) ×1 IMPLANT
SYR 3ML LL SCALE MARK (SYRINGE) ×6 IMPLANT
SYR 5ML LL (SYRINGE) ×3 IMPLANT
SYR TB 1ML 27GX1/2 LL (SYRINGE) ×3 IMPLANT
WATER STERILE IRR 250ML POUR (IV SOLUTION) ×3 IMPLANT
WIPE NON LINTING 3.25X3.25 (MISCELLANEOUS) ×3 IMPLANT

## 2016-11-04 NOTE — Anesthesia Postprocedure Evaluation (Signed)
Anesthesia Post Note  Patient: Michael Lucas  Procedure(s) Performed: Procedure(s) (LRB): CATARACT EXTRACTION PHACO AND INTRAOCULAR LENS PLACEMENT (IOC) (Right)  Patient location during evaluation: PACU Anesthesia Type: MAC Level of consciousness: oriented and awake and alert Pain management: satisfactory to patient Vital Signs Assessment: post-procedure vital signs reviewed and stable Respiratory status: respiratory function stable Cardiovascular status: stable Anesthetic complications: no    Last Vitals:  Vitals:   11/04/16 0651  BP: 130/67  Pulse: 95  Resp: 20  Temp: (!) 35.7 C    Last Pain:  Vitals:   11/04/16 0651  TempSrc: Tympanic                 [name]

## 2016-11-04 NOTE — H&P (Signed)
The History and Physical notes are on paper, have been signed, and are to be scanned. The patient remains stable and unchanged from the H&P.   Previous H&P reviewed, patient examined, and there are no changes.  Benay Pillow 11/04/2016 8:04 AM

## 2016-11-04 NOTE — Anesthesia Procedure Notes (Signed)
Procedure Name: MAC Performed by: Koryn Charlot Pre-anesthesia Checklist: Patient identified, Emergency Drugs available, Suction available, Patient being monitored and Timeout performed Oxygen Delivery Method: Nasal cannula       

## 2016-11-04 NOTE — Progress Notes (Signed)
Blood sugar checked at 170

## 2016-11-04 NOTE — Op Note (Signed)
OPERATIVE NOTE  Michael Lucas [id number] 11/04/2016   PREOPERATIVE DIAGNOSIS:  Nuclear sclerotic cataract right eye.  H25.11   POSTOPERATIVE DIAGNOSIS:    Nuclear sclerotic cataract right eye.     PROCEDURE:  Phacoemusification with posterior chamber intraocular lens placement of the right eye   LENS:   Implant Name Type Inv. Item Serial No. Manufacturer Lot No. LRB No. Used  LENS IOL SYMFONY 18.0 - [id number] Intraocular Lens LENS IOL SYMFONY 18.0 [id number]   Right 1       [id number] +18.0 D IOL multifocal   ULTRASOUND TIME: 0 minutes 53 seconds.  CDE 6.75   SURGEON:  Benay Pillow, MD, MPH  ANESTHESIOLOGIST: Anesthesiologist: Gunnar Fusi, MD CRNA: Demetrius Charity, CRNA   ANESTHESIA:  Topical with tetracaine drops augmented with 1% preservative-free intracameral lidocaine.  ESTIMATED BLOOD LOSS: less than 1 mL.   COMPLICATIONS:  None.   DESCRIPTION OF PROCEDURE:  The patient was identified in the holding room and transported to the operating room and placed in the supine position under the operating microscope.  The right eye was identified as the operative eye and it was prepped and draped in the usual sterile ophthalmic fashion.   A 1.0 millimeter clear-corneal paracentesis was made at the 10:30 position. 0.5 ml of preservative-free 1% lidocaine with epinephrine was injected into the anterior chamber.  The anterior chamber was filled with Healon 5 viscoelastic.  A 2.4 millimeter keratome was used to make a near-clear corneal incision at the 8:00 position.  A curvilinear capsulorrhexis was made with a cystotome and capsulorrhexis forceps.  Balanced salt solution was used to hydrodissect and hydrodelineate the nucleus.   Phacoemulsification was then used in stop and chop fashion to remove the lens nucleus and epinucleus.  The remaining cortex was then removed using the irrigation and aspiration handpiece. Healon was then placed into the capsular bag to distend it for lens  placement.  A lens was then injected into the capsular bag.  The remaining viscoelastic was aspirated.   Wounds were hydrated with balanced salt solution.  The anterior chamber was inflated to a physiologic pressure with balanced salt solution.   The lens was well centered, confirmed with Purkinje images.  Intracameral vigamox 0.1 mL undiluted was injected into the eye and a drop placed onto the ocular surface.  No wound leaks were noted.  The patient was taken to the recovery room in stable condition without complications of anesthesia or surgery  Benay Pillow 11/04/2016, 8:40 AM

## 2016-11-04 NOTE — Anesthesia Preprocedure Evaluation (Signed)
Anesthesia Evaluation  Patient identified by MRN, date of birth, ID band Patient awake    Reviewed: Allergy & Precautions, NPO status , Patient's Chart, lab work & pertinent test results  History of Anesthesia Complications Negative for: history of anesthetic complications  Airway Mallampati: III       Dental   Pulmonary sleep apnea , former smoker,           Cardiovascular hypertension, Pt. on medications and Pt. on home beta blockers + CAD, +CHF and + Orthopnea  + dysrhythmias Atrial Fibrillation + Valvular Problems/Murmurs      Neuro/Psych Anxiety Depression CVA    GI/Hepatic Neg liver ROS, GERD  Medicated,  Endo/Other  diabetes, Insulin DependentHypothyroidism   Renal/GU Renal InsufficiencyRenal disease     Musculoskeletal   Abdominal   Peds  Hematology   Anesthesia Other Findings   Reproductive/Obstetrics                             Anesthesia Physical Anesthesia Plan  ASA: III  Anesthesia Plan: MAC   Post-op Pain Management:    Induction: Intravenous  Airway Management Planned:   Additional Equipment:   Intra-op Plan:   Post-operative Plan:   Informed Consent: I have reviewed the patients History and Physical, chart, labs and discussed the procedure including the risks, benefits and alternatives for the proposed anesthesia with the patient or authorized representative who has indicated his/her understanding and acceptance.     Plan Discussed with:   Anesthesia Plan Comments:         Anesthesia Quick Evaluation

## 2016-11-04 NOTE — Discharge Instructions (Signed)
Eye Surgery Discharge Instructions  Expect mild scratchy sensation or mild soreness. DO NOT RUB YOUR EYE!  The day of surgery:  Minimal physical activity, but bed rest is not required  No reading, computer work, or close hand work  No bending, lifting, or straining.  May watch TV  For 24 hours:  No driving, legal decisions, or alcoholic beverages  Safety precautions  Eat anything you prefer: It is better to start with liquids, then soup then solid foods.  _____ Eye patch should be worn until postoperative exam tomorrow.  ____ Solar shield eyeglasses should be worn for comfort in the sunlight/patch while sleeping  Resume all regular medications including aspirin or Coumadin if these were discontinued prior to surgery. You may shower, bathe, shave, or wash your hair. Tylenol may be taken for mild discomfort.  Call your doctor if you experience significant pain, nausea, or vomiting, fever > 101 or other signs of infection. [phone] or [phone] Specific instructions:  Follow-up Information    Benay Pillow, MD Follow up.   Specialty:  Ophthalmology Why:  11-05-16 at 8:20 Contact information: [address] [place] [address] [phone]          Eye Surgery Discharge Instructions  Expect mild scratchy sensation or mild soreness. DO NOT RUB YOUR EYE!  The day of surgery:  Minimal physical activity, but bed rest is not required  No reading, computer work, or close hand work  No bending, lifting, or straining.  May watch TV  For 24 hours:  No driving, legal decisions, or alcoholic beverages  Safety precautions  Eat anything you prefer: It is better to start with liquids, then soup then solid foods.  _____ Eye patch should be worn until postoperative exam tomorrow.  ____ Solar shield eyeglasses should be worn for comfort in the sunlight/patch while sleeping  Resume all regular medications including aspirin or Coumadin if these were  discontinued prior to surgery. You may shower, bathe, shave, or wash your hair. Tylenol may be taken for mild discomfort.  Call your doctor if you experience significant pain, nausea, or vomiting, fever > 101 or other signs of infection. [phone] or [phone] Specific instructions:  Follow-up Information    Benay Pillow, MD Follow up.   Specialty:  Ophthalmology Why:  11-05-16 at 8:20 Contact information: [address] [place] [address] [phone]

## 2016-11-04 NOTE — Transfer of Care (Signed)
Immediate Anesthesia Transfer of Care Note  Patient: Michael Lucas  Procedure(s) Performed: Procedure(s) with comments: CATARACT EXTRACTION PHACO AND INTRAOCULAR LENS PLACEMENT (IOC) (Right) - [place] 53.7 AP% 12.6 CDE 6.75 Fluid pack lot # [id number]  Patient Location: PACU  Anesthesia Type:MAC  Level of Consciousness: awake, alert  and oriented  Airway & Oxygen Therapy: Patient Spontanous Breathing  Post-op Assessment: Report given to RN and Post -op Vital signs reviewed and stable  Post vital signs: Reviewed and stable  Last Vitals:  Vitals:   11/04/16 0651  BP: 130/67  Pulse: 95  Resp: 20  Temp: (!) 35.7 C    Last Pain:  Vitals:   11/04/16 0651  TempSrc: Tympanic         Complications: No apparent anesthesia complications

## 2016-11-04 NOTE — Progress Notes (Signed)
IV discontinued.

## 2016-11-18 ENCOUNTER — Telehealth: Payer: Self-pay | Admitting: Family Medicine

## 2016-11-18 NOTE — Telephone Encounter (Signed)
Called Pt to schedule AWV with [facility] - knb °

## 2016-11-25 DIAGNOSIS — H2512 Age-related nuclear cataract, left eye: Secondary | ICD-10-CM | POA: Diagnosis not present

## 2016-11-30 ENCOUNTER — Encounter: Payer: Self-pay | Admitting: *Deleted

## 2016-12-02 ENCOUNTER — Ambulatory Visit: Payer: Medicare Other | Admitting: Certified Registered Nurse Anesthetist

## 2016-12-02 ENCOUNTER — Ambulatory Visit
Admission: RE | Admit: 2016-12-02 | Discharge: 2016-12-02 | Disposition: A | Discharge status: Home / Self Care | Payer: Medicare Other | Source: Ambulatory Visit | Attending: Ophthalmology | Admitting: Ophthalmology

## 2016-12-02 ENCOUNTER — Encounter
Admission: RE | Disposition: A | Discharge status: Home / Self Care | Payer: Self-pay | Source: Ambulatory Visit | Attending: Ophthalmology

## 2016-12-02 ENCOUNTER — Encounter: Payer: Self-pay | Admitting: *Deleted

## 2016-12-02 DIAGNOSIS — N183 Chronic kidney disease, stage 3 (moderate): Secondary | ICD-10-CM | POA: Insufficient documentation

## 2016-12-02 DIAGNOSIS — F419 Anxiety disorder, unspecified: Secondary | ICD-10-CM | POA: Diagnosis not present

## 2016-12-02 DIAGNOSIS — I130 Hypertensive heart and chronic kidney disease with heart failure and stage 1 through stage 4 chronic kidney disease, or unspecified chronic kidney disease: Secondary | ICD-10-CM | POA: Insufficient documentation

## 2016-12-02 DIAGNOSIS — E1142 Type 2 diabetes mellitus with diabetic polyneuropathy: Secondary | ICD-10-CM | POA: Diagnosis not present

## 2016-12-02 DIAGNOSIS — G4733 Obstructive sleep apnea (adult) (pediatric): Secondary | ICD-10-CM | POA: Insufficient documentation

## 2016-12-02 DIAGNOSIS — H2512 Age-related nuclear cataract, left eye: Secondary | ICD-10-CM | POA: Insufficient documentation

## 2016-12-02 DIAGNOSIS — F329 Major depressive disorder, single episode, unspecified: Secondary | ICD-10-CM | POA: Insufficient documentation

## 2016-12-02 DIAGNOSIS — I509 Heart failure, unspecified: Secondary | ICD-10-CM | POA: Diagnosis not present

## 2016-12-02 DIAGNOSIS — I2510 Atherosclerotic heart disease of native coronary artery without angina pectoris: Secondary | ICD-10-CM | POA: Diagnosis not present

## 2016-12-02 DIAGNOSIS — Z79899 Other long term (current) drug therapy: Secondary | ICD-10-CM | POA: Diagnosis not present

## 2016-12-02 DIAGNOSIS — E039 Hypothyroidism, unspecified: Secondary | ICD-10-CM | POA: Insufficient documentation

## 2016-12-02 DIAGNOSIS — Z955 Presence of coronary angioplasty implant and graft: Secondary | ICD-10-CM | POA: Diagnosis not present

## 2016-12-02 DIAGNOSIS — K219 Gastro-esophageal reflux disease without esophagitis: Secondary | ICD-10-CM | POA: Insufficient documentation

## 2016-12-02 DIAGNOSIS — J449 Chronic obstructive pulmonary disease, unspecified: Secondary | ICD-10-CM | POA: Diagnosis not present

## 2016-12-02 DIAGNOSIS — I110 Hypertensive heart disease with heart failure: Secondary | ICD-10-CM | POA: Diagnosis not present

## 2016-12-02 DIAGNOSIS — I5032 Chronic diastolic (congestive) heart failure: Secondary | ICD-10-CM | POA: Insufficient documentation

## 2016-12-02 DIAGNOSIS — Z794 Long term (current) use of insulin: Secondary | ICD-10-CM | POA: Diagnosis not present

## 2016-12-02 DIAGNOSIS — Z87891 Personal history of nicotine dependence: Secondary | ICD-10-CM | POA: Insufficient documentation

## 2016-12-02 DIAGNOSIS — E1122 Type 2 diabetes mellitus with diabetic chronic kidney disease: Secondary | ICD-10-CM | POA: Diagnosis not present

## 2016-12-02 DIAGNOSIS — Z86711 Personal history of pulmonary embolism: Secondary | ICD-10-CM | POA: Insufficient documentation

## 2016-12-02 DIAGNOSIS — M1990 Unspecified osteoarthritis, unspecified site: Secondary | ICD-10-CM | POA: Diagnosis not present

## 2016-12-02 DIAGNOSIS — I25810 Atherosclerosis of coronary artery bypass graft(s) without angina pectoris: Secondary | ICD-10-CM | POA: Diagnosis not present

## 2016-12-02 HISTORY — PX: CATARACT EXTRACTION W/PHACO: SHX586

## 2016-12-02 LAB — GLUCOSE, CAPILLARY: GLUCOSE-CAPILLARY: 206 mg/dL — AB (ref 65–99)

## 2016-12-02 SURGERY — PHACOEMULSIFICATION, CATARACT, WITH IOL INSERTION
Anesthesia: Monitor Anesthesia Care | Site: Eye | Laterality: Left | Wound class: Clean

## 2016-12-02 MED ORDER — LIDOCAINE HCL (PF) 4 % IJ SOLN
INTRAMUSCULAR | Status: DC | PRN
  Administered 2016-12-02: 4 mL via OPHTHALMIC

## 2016-12-02 MED ORDER — SODIUM HYALURONATE 10 MG/ML IO SOLN
INTRAOCULAR | Status: DC | PRN
  Administered 2016-12-02: 0.85 mL via INTRAOCULAR

## 2016-12-02 MED ORDER — MIDAZOLAM HCL 2 MG/2ML IJ SOLN
INTRAMUSCULAR | Status: DC | PRN
Start: 2016-12-02 — End: 2016-12-02
  Administered 2016-12-02: 1 mg via INTRAVENOUS

## 2016-12-02 MED ORDER — SODIUM HYALURONATE 10 MG/ML IO SOLN
INTRAOCULAR | Status: AC
  Filled 2016-12-02: qty 0.85

## 2016-12-02 MED ORDER — MOXIFLOXACIN HCL 0.5 % OP SOLN
OPHTHALMIC | Status: AC
  Filled 2016-12-02: qty 3

## 2016-12-02 MED ORDER — EPINEPHRINE PF 1 MG/ML IJ SOLN
INTRAMUSCULAR | Status: AC
  Filled 2016-12-02: qty 2

## 2016-12-02 MED ORDER — EPINEPHRINE PF 1 MG/ML IJ SOLN
INTRAOCULAR | Status: DC | PRN
  Administered 2016-12-02: 08:00:00 via OPHTHALMIC

## 2016-12-02 MED ORDER — MOXIFLOXACIN HCL 0.5 % OP SOLN: 1.0000 [drp] | OPHTHALMIC | Status: DC | PRN

## 2016-12-02 MED ORDER — ARMC OPHTHALMIC DILATING DROPS
OPHTHALMIC | Status: AC
  Administered 2016-12-02: 1 via OPHTHALMIC
  Filled 2016-12-02: qty 0.4

## 2016-12-02 MED ORDER — MIDAZOLAM HCL 2 MG/2ML IJ SOLN
INTRAMUSCULAR | Status: AC
  Filled 2016-12-02: qty 2

## 2016-12-02 MED ORDER — SODIUM HYALURONATE 23 MG/ML IO SOLN
INTRAOCULAR | Status: AC
  Filled 2016-12-02: qty 0.6

## 2016-12-02 MED ORDER — FENTANYL CITRATE (PF) 100 MCG/2ML IJ SOLN
INTRAMUSCULAR | Status: AC
  Filled 2016-12-02: qty 2

## 2016-12-02 MED ORDER — ARMC OPHTHALMIC DILATING DROPS
1.0000 "application " | OPHTHALMIC | Status: AC
  Administered 2016-12-02 (×3): 1 via OPHTHALMIC

## 2016-12-02 MED ORDER — LIDOCAINE HCL (PF) 4 % IJ SOLN
INTRAMUSCULAR | Status: AC
  Filled 2016-12-02: qty 5

## 2016-12-02 MED ORDER — MOXIFLOXACIN HCL 0.5 % OP SOLN
OPHTHALMIC | Status: DC | PRN
  Administered 2016-12-02: 1 [drp] via OPHTHALMIC

## 2016-12-02 MED ORDER — POVIDONE-IODINE 5 % OP SOLN
OPHTHALMIC | Status: AC
  Filled 2016-12-02: qty 30

## 2016-12-02 MED ORDER — SODIUM CHLORIDE 0.9 % IV SOLN
INTRAVENOUS | Status: DC
  Administered 2016-12-02: 07:00:00 via INTRAVENOUS

## 2016-12-02 MED ORDER — POVIDONE-IODINE 5 % OP SOLN
OPHTHALMIC | Status: DC | PRN
  Administered 2016-12-02: 1 via OPHTHALMIC

## 2016-12-02 MED ORDER — SODIUM HYALURONATE 23 MG/ML IO SOLN
INTRAOCULAR | Status: DC | PRN
  Administered 2016-12-02: 0.6 mL via INTRAOCULAR

## 2016-12-02 MED ORDER — FENTANYL CITRATE (PF) 100 MCG/2ML IJ SOLN
INTRAMUSCULAR | Status: DC | PRN
  Administered 2016-12-02: 25 ug via INTRAVENOUS

## 2016-12-02 SURGICAL SUPPLY — 23 items
CANNULA ANT/CHMB 27GA (MISCELLANEOUS) ×6 IMPLANT
CUP MEDICINE 2OZ PLAST GRAD ST (MISCELLANEOUS) ×3 IMPLANT
DISSECTOR HYDRO NUCLEUS 50X22 (MISCELLANEOUS) ×3 IMPLANT
GLOVE BIO SURGEON STRL SZ8 (GLOVE) ×3 IMPLANT
GLOVE BIOGEL M 6.5 STRL (GLOVE) ×3 IMPLANT
GLOVE SURG LX 7.5 STRW (GLOVE) ×2
GLOVE SURG LX STRL 7.5 STRW (GLOVE) ×1 IMPLANT
GOWN STRL REUS W/ TWL LRG LVL3 (GOWN DISPOSABLE) ×2 IMPLANT
GOWN STRL REUS W/TWL LRG LVL3 (GOWN DISPOSABLE) ×4
LENS IOL TECNIS SYMFONY 19.0 (Intraocular Lens) ×3 IMPLANT
NEEDLE CAPSULORHEX 25GA (NEEDLE) ×3 IMPLANT
PACK CATARACT (MISCELLANEOUS) ×3 IMPLANT
PACK CATARACT BRASINGTON LX (MISCELLANEOUS) ×3 IMPLANT
PACK EYE AFTER SURG (MISCELLANEOUS) ×3 IMPLANT
SOL BSS BAG (MISCELLANEOUS) ×3
SOL PREP PVP 2OZ (MISCELLANEOUS) ×3
SOLUTION BSS BAG (MISCELLANEOUS) ×1 IMPLANT
SOLUTION PREP PVP 2OZ (MISCELLANEOUS) ×1 IMPLANT
SYR 3ML LL SCALE MARK (SYRINGE) ×6 IMPLANT
SYR 5ML LL (SYRINGE) ×3 IMPLANT
SYR TB 1ML 27GX1/2 LL (SYRINGE) ×3 IMPLANT
WATER STERILE IRR 250ML POUR (IV SOLUTION) ×3 IMPLANT
WIPE NON LINTING 3.25X3.25 (MISCELLANEOUS) ×3 IMPLANT

## 2016-12-02 NOTE — Discharge Instructions (Signed)
Eye Surgery Discharge Instructions  Expect mild scratchy sensation or mild soreness. DO NOT RUB YOUR EYE!  The day of surgery:  Minimal physical activity, but bed rest is not required  No reading, computer work, or close hand work  No bending, lifting, or straining.  May watch TV  For 24 hours:  No driving, legal decisions, or alcoholic beverages  Safety precautions  Eat anything you prefer: It is better to start with liquids, then soup then solid foods.  _____ Eye patch should be worn until postoperative exam tomorrow.  ____ Solar shield eyeglasses should be worn for comfort in the sunlight/patch while sleeping  Resume all regular medications including aspirin or Coumadin if these were discontinued prior to surgery. You may shower, bathe, shave, or wash your hair. Tylenol may be taken for mild discomfort.  Call your doctor if you experience significant pain, nausea, or vomiting, fever > 101 or other signs of infection. [phone] or [phone] Specific instructions:  Follow-up Information    Benay Pillow, MD Follow up.   Specialty:  Ophthalmology Why:   December 5 at 9:30am Contact information: [address] [place] [address] [phone]

## 2016-12-02 NOTE — Anesthesia Preprocedure Evaluation (Signed)
Anesthesia Evaluation  Patient identified by MRN, date of birth, ID band Patient awake    Reviewed: Allergy & Precautions, NPO status , Patient's Chart, lab work & pertinent test results  History of Anesthesia Complications Negative for: history of anesthetic complications  Airway Mallampati: II  TM Distance: >3 FB Neck ROM: Full    Dental  (+) Edentulous Upper, Edentulous Lower   Pulmonary sleep apnea (does not wear CPAP) , neg COPD, former smoker,    breath sounds clear to auscultation- rhonchi (-) wheezing      Cardiovascular hypertension, (-) angina+ CAD, + CABG (58099), +CHF (preserved EF) and + Orthopnea  + dysrhythmias (s/p ablation) Atrial Fibrillation  Rhythm:Regular Rate:Normal - Systolic murmurs and - Diastolic murmurs Echo 07/01/37: - Left ventricle: The cavity size was normal. There was mild   concentric hypertrophy. Systolic function was normal. The   estimated ejection fraction was in the range of 55% to 60%. Wall   motion was normal; there were no regional wall motion   abnormalities. - Mitral valve: There was mild regurgitation. - Left atrium: The atrium was mildly dilated. - Pulmonary arteries: Systolic pressure was mildly increased. PA   peak pressure: 40 mm Hg (S).   Neuro/Psych Anxiety Depression CVA    GI/Hepatic Neg liver ROS, GERD  ,  Endo/Other  diabetes, Type 2, Oral Hypoglycemic Agents, Insulin DependentHypothyroidism   Renal/GU Renal InsufficiencyRenal disease     Musculoskeletal  (+) Arthritis ,   Abdominal (+) + obese,   Peds  Hematology  (+) anemia ,   Anesthesia Other Findings Past Medical History: No date: Anemia No date: Ankle fracture, left No date: Anxiety No date: Arthritis No date: Atrial flutter ([place])     Comment: a. 12/2008 s/p RFCA. Holter monitor 9/20 showed              predominantly NSR with some short runs of               atrial fibrillation. Coumadin stopped 7/11  due               to frequent falls. No date: Carotid arterial disease ([place])     Comment: a. 11/2011 U/S: 0-39% bilat ICA stenosis;  b.               03/2013 Carotid U/S: 0-39% bilat ICA stenosis. No date: Cervical disc disease No date: Chronic back pain No date: Chronic diastolic CHF (congestive heart failur*     Comment: a. 07/2009 Echo: EF 50-55%, mild LVH, grade I               DD, mild MR; b. 04/2015 Echo: Ef 65-70%, triv               AI. No date: Chronic neck pain No date: CKD (chronic kidney disease), stage III     Comment: stage III No date: Coronary artery disease     Comment: a. 1995 s/p CABG x 1 (LIMA->LAD);  b. 04/2001               Cath: LAD 100, patent LIMA->LAD, otw nonobs               LCX/RCA dzs. No date: Depression No date: Dermatitis No date: Diabetes mellitus No date: Dyspnea No date: Dysrhythmia No date: Edema     Comment: FEET/LEGS No date: Fractured fibula No date: Heart murmur No date: Hypertensive heart disease No date: Hypothyroidism No date: Nephrolithiasis     Comment:  h/o No date: Orthopnea     Comment: 2-3 PILLOWS No date: OSA (obstructive sleep apnea)     Comment: a. Unable to tolerate CPAP because of allergic              reaction to straps of face mask. No date: Paroxysmal atrial fibrillation ([place])     Comment: a. 07/2009 PAF noted on holter; b. 05/2010               Coumadin d/c'd 2/2 falls; c. 02/2015 CHA2DS2VASc              = 5-->Xarelto. No date: Peripheral neuropathy ([place])     Comment: suspect diabetes related. has led to gait               instability. seen by [place] neurology, head               MRI showed only mild diffuse atrophy with               moderate peri-sylvan atrophy. No date: PTSD (post-traumatic stress disorder)     Comment: takes prazosin for this. 2010: Pulmonary embolism ([place]) No date: Stroke [facility])     Comment: POSSIBLE TIA   Reproductive/Obstetrics                             Anesthesia  Physical Anesthesia Plan  ASA: III  Anesthesia Plan: MAC   Post-op Pain Management:    Induction: Intravenous  Airway Management Planned: Natural Airway  Additional Equipment:   Intra-op Plan:   Post-operative Plan:   Informed Consent: I have reviewed the patients History and Physical, chart, labs and discussed the procedure including the risks, benefits and alternatives for the proposed anesthesia with the patient or authorized representative who has indicated his/her understanding and acceptance.     Plan Discussed with: CRNA and Anesthesiologist  Anesthesia Plan Comments:         Anesthesia Quick Evaluation

## 2016-12-02 NOTE — Anesthesia Postprocedure Evaluation (Signed)
Anesthesia Post Note  Patient: Michael Lucas  Procedure(s) Performed: Procedure(s) (LRB): CATARACT EXTRACTION PHACO AND INTRAOCULAR LENS PLACEMENT (IOC) (Left)  Patient location during evaluation: PACU Anesthesia Type: MAC Level of consciousness: awake and alert and oriented Pain management: pain level controlled Vital Signs Assessment: post-procedure vital signs reviewed and stable Respiratory status: spontaneous breathing, nonlabored ventilation, respiratory function stable and patient connected to nasal cannula oxygen Cardiovascular status: stable and blood pressure returned to baseline Anesthetic complications: no     Last Vitals:  Vitals:   12/02/16 0807 12/02/16 0808  BP: 115/69 115/69  Pulse: 82 73  Resp: 18 16  Temp: 36.7 C 36.7 C    Last Pain:  Vitals:   12/02/16 0808  TempSrc: Tympanic  PainSc:                  Jacquelina Hewins

## 2016-12-02 NOTE — OR Nursing (Signed)
Patient reports taking 1/2 of insulin last night at bedtime which was 15 units lantus.

## 2016-12-02 NOTE — Op Note (Signed)
OPERATIVE NOTE  Rodriguez Sedore [id number] 12/02/2016   PREOPERATIVE DIAGNOSIS:  Nuclear sclerotic cataract left eye.  H25.12   POSTOPERATIVE DIAGNOSIS:    Nuclear sclerotic cataract left eye.     PROCEDURE:  Phacoemusification with posterior chamber intraocular lens placement of the left eye   LENS:   Implant Name Type Inv. Item Serial No. Manufacturer Lot No. LRB No. Used  tecnis symfony iol     [id number] [facility]   Left 1       [id number] Symfony multifocal +19.0   ULTRASOUND TIME: 0 minutes 43.5 seconds.  CDE 5.48   SURGEON:  Benay Pillow, MD, MPH   ANESTHESIA:  Topical with tetracaine drops augmented with 1% preservative-free intracameral lidocaine.   COMPLICATIONS:  None.   DESCRIPTION OF PROCEDURE:  The patient was identified in the holding room and transported to the operating room and placed in the supine position under the operating microscope.  The left eye was identified as the operative eye and it was prepped and draped in the usual sterile ophthalmic fashion.   A 1.0 millimeter clear-corneal paracentesis was made at the 5:00 position. 0.5 ml of preservative-free 1% lidocaine with epinephrine was injected into the anterior chamber.  The anterior chamber was filled with Healon 5 viscoelastic.  A 2.4 millimeter keratome was used to make a near-clear corneal incision at the 2:00 position.  A curvilinear capsulorrhexis was made with a cystotome and capsulorrhexis forceps.  Balanced salt solution was used to hydrodissect and hydrodelineate the nucleus.   Phacoemulsification was then used in stop and chop fashion to remove the lens nucleus and epinucleus.  The remaining cortex was then removed using the irrigation and aspiration handpiece.   Most of the cortex had been removed with the phaco, and there were only a few strands remaining.  Healon was then placed into the capsular bag to distend it for lens placement.  A lens was then injected into the capsular bag.  The remaining  viscoelastic was aspirated.   Wounds were hydrated with balanced salt solution.  The lens was well centered.  The anterior chamber was inflated to a physiologic pressure with balanced salt solution.   Intracameral vigamox 0.1 mL undiltued was injected into the eye and a drop placed onto the ocular surface.  No wound leaks were noted.  The patient was taken to the recovery room in stable condition without complications of anesthesia or surgery  Benay Pillow 12/02/2016, 8:04 AM

## 2016-12-02 NOTE — Telephone Encounter (Signed)
Called Pt to schedule AWV with NHA on 4/11 @ 2pm move appt with dr. Rosanna Randy to 2:45pm - knb

## 2016-12-02 NOTE — Transfer of Care (Signed)
Immediate Anesthesia Transfer of Care Note  Patient: Michael Lucas  Procedure(s) Performed: Procedure(s) with comments: CATARACT EXTRACTION PHACO AND INTRAOCULAR LENS PLACEMENT (IOC) (Left) - [place] 43.5 AP% 12.5 CDE 5.48 Fluid Pack lot # [id number] H  Patient Location: PACU  Anesthesia Type:MAC  Level of Consciousness: awake  Airway & Oxygen Therapy: Patient Spontanous Breathing and Patient connected to nasal cannula oxygen  Post-op Assessment: Report given to RN and Post -op Vital signs reviewed and stable  Post vital signs: Reviewed and stable  Last Vitals:  Vitals:   12/02/16 0626 12/02/16 0808  BP: 110/67 115/69  Pulse: 87 73  Resp: 18 16  Temp: 36.9 C 36.7 C    Last Pain:  Vitals:   12/02/16 0808  TempSrc: Tympanic  PainSc:          Complications: No apparent anesthesia complications

## 2016-12-02 NOTE — H&P (Signed)
The History and Physical notes are on paper, have been signed, and are to be scanned. The patient remains stable and unchanged from the H&P.   Previous H&P reviewed, patient examined, and there are no changes.  Michael Lucas 12/02/2016 7:28 AM

## 2016-12-02 NOTE — Anesthesia Procedure Notes (Signed)
Procedure Name: MAC Date/Time: 12/02/2016 7:35 AM Performed by: Johnna Acosta Pre-anesthesia Checklist: Patient identified, Emergency Drugs available, Suction available, Patient being monitored and Timeout performed Patient Re-evaluated:Patient Re-evaluated prior to inductionOxygen Delivery Method: Nasal cannula

## 2016-12-03 NOTE — Telephone Encounter (Signed)
Pt returned call and schedule AWV with NHA on 03/09/17 and F/U with Dr. Rosanna Randy at 245 on 03/09/17. Thanks TNP

## 2016-12-31 ENCOUNTER — Ambulatory Visit (INDEPENDENT_AMBULATORY_CARE_PROVIDER_SITE_OTHER): Payer: Medicare Other | Admitting: Cardiovascular Disease

## 2016-12-31 ENCOUNTER — Encounter: Payer: Self-pay | Admitting: Cardiovascular Disease

## 2016-12-31 VITALS — BP 120/68 | HR 97 | Ht 68.0 in | Wt 228.0 lb

## 2016-12-31 DIAGNOSIS — I480 Paroxysmal atrial fibrillation: Secondary | ICD-10-CM | POA: Diagnosis not present

## 2016-12-31 DIAGNOSIS — I10 Essential (primary) hypertension: Secondary | ICD-10-CM

## 2016-12-31 DIAGNOSIS — I5032 Chronic diastolic (congestive) heart failure: Secondary | ICD-10-CM

## 2016-12-31 DIAGNOSIS — I2510 Atherosclerotic heart disease of native coronary artery without angina pectoris: Secondary | ICD-10-CM

## 2016-12-31 NOTE — Progress Notes (Signed)
Cardiology Office Note   Date:  12/31/2016   ID:  Michael Lucas, DOB 1946/05/18, MRN [id number]  PCP:  Wilhemena Durie, MD  Cardiologist:   Kathlyn Sacramento, MD   Chief Complaint  Patient presents with  . OTHER    6 month f/u no complaints. Meds reviewed verbally with pt.      History of Present Illness: Michael Lucas is a 71 y.o. male who presents for a follow up visit regarding CAD and atrial flutter/fibrillation.  He has history of CAD s/p CABG 1995, atrial fib/flutter s/p atrial flutter ablation 2/10, HTN, gait instability/peripheral neuropathy, depression and chronic diastolic CHF.   He had atrial fibrillation in 2016. Metoprolol was gradually increased to 100 mg twice daily and he was started on anticoagulation with Xarelto.  Most recent echocardiogram in June 2017 showed normal LV systolic function with an EF of 55-60% with mild mitral regurgitation, mildly dilated left atrium and mild pulmonary hypertension. Previous carotid Doppler in May 2014 showed mild nonobstructive bilateral disease (less than 40%). He does have sleep apnea but currently does not have CPAP machine.  He has been doing reasonably well and denies any chest pain or worsening dyspnea. No palpitations or dizziness. He is noted to be in atrial fibrillation today. He usually does not feel the difference when he is in A. fib versus sinus rhythm.    Past Medical History:  Diagnosis Date  . Anemia   . Ankle fracture, left   . Anxiety   . Arthritis   . Atrial flutter ([place])    a. 12/2008 s/p RFCA. Holter monitor 9/20 showed predominantly NSR with some short runs of atrial fibrillation. Coumadin stopped 7/11 due to frequent falls.  . Carotid arterial disease ([place])    a. 11/2011 U/S: 0-39% bilat ICA stenosis;  b. 03/2013 Carotid U/S: 0-39% bilat ICA stenosis.  . Cervical disc disease   . Chronic back pain   . Chronic diastolic CHF (congestive heart failure) ([place])    a. 07/2009 Echo: EF 50-55%, mild LVH, grade  I DD, mild MR; b. 04/2015 Echo: Ef 65-70%, triv AI.  Marland Kitchen Chronic neck pain   . CKD (chronic kidney disease), stage III    stage III  . Coronary artery disease    a. 1995 s/p CABG x 1 (LIMA->LAD);  b. 04/2001 Cath: LAD 100, patent LIMA->LAD, otw nonobs LCX/RCA dzs.  . Depression   . Dermatitis   . Diabetes mellitus   . Dyspnea   . Dysrhythmia   . Edema    FEET/LEGS  . Fractured fibula   . Heart murmur   . Hypertensive heart disease   . Hypothyroidism   . Nephrolithiasis    h/o  . Orthopnea    2-3 PILLOWS  . OSA (obstructive sleep apnea)    a. Unable to tolerate CPAP because of allergic reaction to straps of face mask.  . Paroxysmal atrial fibrillation ([place])    a. 07/2009 PAF noted on holter; b. 05/2010 Coumadin d/c'd 2/2 falls; c. 02/2015 CHA2DS2VASc = 5-->Xarelto.  . Peripheral neuropathy ([place])    suspect diabetes related. has led to gait instability. seen by [place] neurology, head MRI showed only mild diffuse atrophy with moderate peri-sylvan atrophy.  Marland Kitchen PTSD (post-traumatic stress disorder)    takes prazosin for this.  . Pulmonary embolism ([place]) 2010  . Stroke [facility])    POSSIBLE TIA    Past Surgical History:  Procedure Laterality Date  . ABLATION     CARDIAC  .  CATARACT EXTRACTION W/PHACO Right 11/04/2016   Procedure: CATARACT EXTRACTION PHACO AND INTRAOCULAR LENS PLACEMENT (IOC);  Surgeon: Eulogio Bear, MD;  Location: [facility] ORS;  Service: Ophthalmology;  Laterality: Right;  [place] 53.7 AP% 12.6 CDE 6.75 Fluid pack lot # [id number]  . CATARACT EXTRACTION W/PHACO Left 12/02/2016   Procedure: CATARACT EXTRACTION PHACO AND INTRAOCULAR LENS PLACEMENT (IOC);  Surgeon: Eulogio Bear, MD;  Location: [facility] ORS;  Service: Ophthalmology;  Laterality: Left;  [place] 43.5 AP% 12.5 CDE 5.48 Fluid Pack lot # [id number]  . CORONARY ARTERY BYPASS GRAFT  1995  . HEMORRHOID SURGERY    . HERNIA REPAIR    . ROTATOR CUFF REPAIR    . ROTATOR CUFF REPAIR       Current Outpatient Prescriptions    Medication Sig Dispense Refill  . atorvastatin (LIPITOR) 80 MG tablet Take 80 mg by mouth every evening.     . docusate sodium (COLACE) 50 MG capsule Take 150 mg by mouth daily.    . Emollient (EUCERIN PLUS) 2.5-10 % CREA Apply 1 application topically daily as needed (dry skin).     . feeding supplement, GLUCERNA SHAKE, (GLUCERNA SHAKE) LIQD Take 237 mLs by mouth 2 (two) times daily.    . furosemide (LASIX) 20 MG tablet Take 20 mg by mouth daily.    . insulin aspart (NOVOLOG) 100 UNIT/ML injection Inject 15 Units into the skin 3 (three) times daily with meals.     . insulin glargine (LANTUS) 100 UNIT/ML injection Inject 30 Units into the skin at bedtime.     Marland Kitchen ketoconazole (NIZORAL) 2 % shampoo Apply 1 application topically See admin instructions. Apply shampoo to head every other day    . levothyroxine (SYNTHROID, LEVOTHROID) 50 MCG tablet Take 50 mcg by mouth daily before breakfast.     . methadone (DOLOPHINE) 10 MG tablet Take 10 mg by mouth 2 (two) times daily.     . metoprolol (LOPRESSOR) 100 MG tablet Take 1 tablet (100 mg total) by mouth 2 (two) times daily. 60 tablet 6  . niacin (NIASPAN) 500 MG CR tablet Take 2 tablets by mouth daily at 12 noon.     . prazosin (MINIPRESS) 2 MG capsule Take 4 mg by mouth at bedtime.     . pregabalin (LYRICA) 75 MG capsule Take 75 mg by mouth 2 (two) times daily.    . QUEtiapine (SEROQUEL) 100 MG tablet Take 50 mg by mouth at bedtime.    . rivaroxaban (XARELTO) 20 MG TABS tablet Take 1 tablet (20 mg total) by mouth daily with supper. 30 tablet 6  . sertraline (ZOLOFT) 100 MG tablet Take 50 mg by mouth daily.    . tacrolimus (PROTOPIC) 0.03 % ointment Apply 1 application topically every other day. Reported on 04/01/2016     No current facility-administered medications for this visit.     Allergies:   Patient has no known allergies.    Social History:  The patient  reports that he has quit smoking. His smoking use included Cigarettes. He has never used  smokeless tobacco. He reports that he does not drink alcohol or use drugs.   Family History:  The patient's family history includes Alcohol abuse in his sister; Cancer in his mother, sister, and sister; Heart disease in his father.    ROS:  Please see the history of present illness.   Otherwise, review of systems are positive for none.   All other systems are reviewed and negative.    PHYSICAL EXAM:  VS:  BP 120/68 (BP Location: Left Arm, Patient Position: Sitting, Cuff Size: Large)   Pulse 97   Ht 5\' 8"  (1.727 m)   Wt 228 lb (103.4 kg)   BMI 34.67 kg/m  , BMI Body mass index is 34.67 kg/m. GEN: Well nourished, well developed, in no acute distress  HEENT: normal  Neck: no JVD, carotid bruits, or masses Cardiac: Irregularly irregular; no murmurs, rubs, or gallops, trace edema  Respiratory:  clear to auscultation bilaterally, normal work of breathing GI: soft, nontender, nondistended, + BS MS: no deformity or atrophy  Skin: warm and dry, no rash Neuro:  Strength and sensation are intact Psych: euthymic mood, full affect   EKG:  EKG is ordered today. EKG showed atrial fibrillation with no significant ST or T wave changes.   Recent Labs: 04/01/2016: ALT 14; BNP 95.8; BUN 17; Creatinine, Ser 1.19; Platelets 212; Potassium 4.7; Sodium 143 09/01/2016: TSH 4.840    Lipid Panel    Component Value Date/Time   CHOL 141 04/01/2016 1505   TRIG 289 (H) 04/01/2016 1505   HDL 39 (L) 04/01/2016 1505   CHOLHDL 3.6 04/01/2016 1505   CHOLHDL 2.9 11/02/2011 0858   VLDL 27 11/02/2011 0858   LDLCALC 44 04/01/2016 1505      Wt Readings from Last 3 Encounters:  12/31/16 228 lb (103.4 kg)  11/30/16 220 lb (99.8 kg)  10/20/16 220 lb (99.8 kg)        ASSESSMENT AND PLAN:   1. Paroxysmal atrial fibrillation: He is noted to be in atrial fibrillation today but he usually cannot tell the difference when he is in atrial fibrillation versus sinus rhythm. Continue rate control with metoprolol  and anticoagulation with  Xarelto.  2. Coronary artery disease: No convincing symptoms of angina.  Continue medical therapy.  3. Essential hypertension: Blood pressure is controlled on current medications.  4. Hyperlipidemia: continue treatment with atorvastatin. Most recent lipid profile showed an LDL of 44.   5. Chronic diastolic heart failure: He seems to be euvolemic.   Disposition:   FU with me in 6 months  Signed,  Kathlyn Sacramento, MD  12/31/2016 2:32 PM    [place]

## 2016-12-31 NOTE — Patient Instructions (Signed)
Medication Instructions: Continue same medications.   Labwork: None.   Procedures/Testing: None.   Follow-Up: 6 months with Dr. Lachelle Rissler.   Any Additional Special Instructions Will Be Listed Below (If Applicable).     If you need a refill on your cardiac medications before your next appointment, please call your pharmacy.   

## 2017-01-14 ENCOUNTER — Other Ambulatory Visit: Payer: Self-pay | Admitting: Cardiovascular Disease

## 2017-01-14 DIAGNOSIS — I6523 Occlusion and stenosis of bilateral carotid arteries: Secondary | ICD-10-CM

## 2017-01-20 ENCOUNTER — Encounter: Payer: Self-pay | Admitting: Physician Assistant

## 2017-01-20 ENCOUNTER — Ambulatory Visit (INDEPENDENT_AMBULATORY_CARE_PROVIDER_SITE_OTHER): Payer: Medicare Other | Admitting: Physician Assistant

## 2017-01-20 VITALS — BP 118/60 | HR 88 | Temp 97.6°F | Resp 16 | Wt 233.0 lb

## 2017-01-20 DIAGNOSIS — M654 Radial styloid tenosynovitis [de Quervain]: Secondary | ICD-10-CM

## 2017-01-20 NOTE — Patient Instructions (Signed)
De Quervain Disease Introduction De Quervain disease is inflammation of the tendon on the thumb side of the wrist. Tendons are cords of tissue that connect bones to muscles. The tendons in your hand pass through a tunnel, or sheath. A slippery layer of tissue (synovium) lets the tendons move smoothly in the sheath. With de Quervain disease, the sheath swells or thickens, causing friction and pain. The condition is also called de Quervain tendinosis and de Quervain syndrome. It occurs most often in women who are 30-50 years old. What are the causes? The exact cause of de Quervain disease is not known. It may result from:  Overusing your hands, especially with repetitive motions that involve twisting your hand or using a forceful grip.  Pregnancy.  Rheumatoid disease. What increases the risk? You may have a greater risk for de Quervain disease if you:  Are a middle-aged woman.  Are pregnant.  Have rheumatoid arthritis.  Have diabetes.  Use your hands far more than normal, especially with a tight grip or excessive twisting. What are the signs or symptoms? Pain on the thumb side of your wrist is the main symptom of de Quervain disease. Other signs and symptoms include:  Pain that gets worse when you grasp something or turn your wrist.  Pain that extends up the forearm.  Cysts in the area of the pain.  Swelling of your wrist and hand.  A sensation of snapping in the wrist.  Trouble moving the thumb and wrist. How is this diagnosed? Your health care provider may diagnose de Quervain disease based on your signs and symptoms. A physical exam will also be done. A simple test (Finkelstein test) that involves pulling your thumb and wrist to see if this causes pain can help determine whether you have the condition. Sometimes you may need to have an X-ray. How is this treated? Avoiding any activity that causes pain and swelling is the best treatment. Other options include:  Wearing a  splint.  Taking medicine. Anti-inflammatory medicines and corticosteroid injections may reduce inflammation and relieve pain.  Having surgery if other treatments do not work. Follow these instructions at home:  Using ice can be helpful after doing activities that involve the sore wrist. To apply ice to the injured area:  Put ice in a plastic bag.  Place a towel between your skin and the bag.  Leave the ice on for 20 minutes, 2-3 times a day.  Take medicines only as directed by your health care provider.  Wear your splint as directed. This will allow your hand to rest and heal. Contact a health care provider if:  Your pain medicine does not help.  Your pain gets worse.  You develop new symptoms. This information is not intended to replace advice given to you by your health care provider. Make sure you discuss any questions you have with your health care provider. Document Released: 08/10/2001 Document Revised: 04/22/2016 Document Reviewed: 03/20/2014  2017 [employer]vier  

## 2017-01-20 NOTE — Progress Notes (Signed)
Patient: Michael Lucas Male    DOB: [birth year]   71 y.o.   MRN: [id number] Visit Date: 01/20/2017  Today's Provider: Trinna Post, PA-C   Chief Complaint  Patient presents with  . Wrist Pain    Started about a month ago.   Subjective:    Wrist Pain   The pain is present in the right wrist. This is a new problem. The current episode started 1 to 4 weeks ago. There has been no history of extremity trauma. The problem has been gradually worsening (Especially in the last 10 days.). The quality of the pain is described as sharp. The pain is at a severity of 8/10. Pertinent negatives include no fever, joint locking, joint swelling, limited range of motion, numbness or tingling. Exacerbated by: Pain is worse when he uses his fingers, or picks up and oject.  Treatments tried: Wrist splint. The treatment provided mild relief.      No Known Allergies   Current Outpatient Prescriptions:  .  atorvastatin (LIPITOR) 80 MG tablet, Take 80 mg by mouth every evening. , Disp: , Rfl:  .  docusate sodium (COLACE) 50 MG capsule, Take 150 mg by mouth daily., Disp: , Rfl:  .  Emollient (EUCERIN PLUS) 2.5-10 % CREA, Apply 1 application topically daily as needed (dry skin). , Disp: , Rfl:  .  feeding supplement, GLUCERNA SHAKE, (GLUCERNA SHAKE) LIQD, Take 237 mLs by mouth 2 (two) times daily., Disp: , Rfl:  .  furosemide (LASIX) 20 MG tablet, Take 20 mg by mouth daily., Disp: , Rfl:  .  insulin aspart (NOVOLOG) 100 UNIT/ML injection, Inject 15 Units into the skin 3 (three) times daily with meals. , Disp: , Rfl:  .  insulin glargine (LANTUS) 100 UNIT/ML injection, Inject 30 Units into the skin at bedtime. , Disp: , Rfl:  .  ketoconazole (NIZORAL) 2 % shampoo, Apply 1 application topically See admin instructions. Apply shampoo to head every other day, Disp: , Rfl:  .  levothyroxine (SYNTHROID, LEVOTHROID) 50 MCG tablet, Take 50 mcg by mouth daily before breakfast. , Disp: , Rfl:  .  methadone  (DOLOPHINE) 10 MG tablet, Take 10 mg by mouth 2 (two) times daily. , Disp: , Rfl:  .  metoprolol (LOPRESSOR) 100 MG tablet, Take 1 tablet (100 mg total) by mouth 2 (two) times daily., Disp: 60 tablet, Rfl: 6 .  niacin (NIASPAN) 500 MG CR tablet, Take 2 tablets by mouth daily at 12 noon. , Disp: , Rfl:  .  prazosin (MINIPRESS) 2 MG capsule, Take 4 mg by mouth at bedtime. , Disp: , Rfl:  .  pregabalin (LYRICA) 75 MG capsule, Take 75 mg by mouth 2 (two) times daily., Disp: , Rfl:  .  QUEtiapine (SEROQUEL) 100 MG tablet, Take 50 mg by mouth at bedtime., Disp: , Rfl:  .  rivaroxaban (XARELTO) 20 MG TABS tablet, Take 1 tablet (20 mg total) by mouth daily with supper., Disp: 30 tablet, Rfl: 6 .  sertraline (ZOLOFT) 100 MG tablet, Take 50 mg by mouth daily., Disp: , Rfl:  .  tacrolimus (PROTOPIC) 0.03 % ointment, Apply 1 application topically every other day. Reported on 04/01/2016, Disp: , Rfl:   Review of Systems  Constitutional: Negative.  Negative for fever.  Musculoskeletal: Positive for arthralgias. Negative for back pain, gait problem, joint swelling, myalgias, neck pain and neck stiffness.  Neurological: Negative for tingling and numbness.    Social History  Substance Use Topics  .  Smoking status: Former Smoker    Types: Cigarettes  . Smokeless tobacco: Never Used  . Alcohol use No   Objective:   BP 118/60 (BP Location: Left Arm, Patient Position: Sitting, Cuff Size: Large)   Pulse 88   Temp 97.6 F (36.4 C) (Oral)   Resp 16   Wt 233 lb (105.7 kg)   BMI 35.43 kg/m   Physical Exam  Constitutional: He appears well-developed and well-nourished.  Elderly, sickly looking man. Talks and moves slowly.  Cardiovascular: Intact distal pulses.   Musculoskeletal:  Left hand exam normal. Right hand w. 5/5 strength. No rash. Full ROM in all planes. No crepitus appreciated. Tender to palpation along lateral aspect of right wrist. Positive Finklesteins on right side.  Skin: There is pallor.         Assessment & Plan:     1. De Quervain's tenosynovitis, right  Patient on large amounts of methadone for chronic pain. With numerous medical comorbidities, hesitant to prescribe NSAIDs. Can try bracing, though counseled this needs to continue for weeks and does not provide instant relief. Best chance of relief is likely to be steroid injection, so will refer to ortho.  - Ambulatory referral to Orthopedics  Return if symptoms worsen or fail to improve.  The entirety of the information documented in the History of Present Illness, Review of Systems and Physical Exam were personally obtained by me. Portions of this information were initially documented by Ashley Royalty, CMA and reviewed by me for thoroughness and accuracy.       Trinna Post, PA-C  [facility]

## 2017-01-24 ENCOUNTER — Ambulatory Visit: Payer: Medicare Other

## 2017-01-24 DIAGNOSIS — I6523 Occlusion and stenosis of bilateral carotid arteries: Secondary | ICD-10-CM | POA: Diagnosis not present

## 2017-01-25 DIAGNOSIS — M778 Other enthesopathies, not elsewhere classified: Secondary | ICD-10-CM | POA: Insufficient documentation

## 2017-01-25 DIAGNOSIS — M65831 Other synovitis and tenosynovitis, right forearm: Secondary | ICD-10-CM | POA: Diagnosis not present

## 2017-01-26 LAB — VAS US CAROTID
LCCADDIAS: 15 cm/s
LCCADSYS: 117 cm/s
LCCAPSYS: 80 cm/s
LEFT ECA DIAS: -7 cm/s
LEFT VERTEBRAL DIAS: 10 cm/s
LICADSYS: -92 cm/s
LICAPSYS: -136 cm/s
Left CCA prox dias: 15 cm/s
Left ICA dist dias: -23 cm/s
Left ICA prox dias: -26 cm/s
RCCADSYS: -106 cm/s
RIGHT ECA DIAS: 0 cm/s
Right CCA prox dias: 0 cm/s
Right CCA prox sys: 99 cm/s

## 2017-02-22 DIAGNOSIS — M65831 Other synovitis and tenosynovitis, right forearm: Secondary | ICD-10-CM | POA: Diagnosis not present

## 2017-03-01 ENCOUNTER — Telehealth: Payer: Self-pay | Admitting: Family Medicine

## 2017-03-01 NOTE — Telephone Encounter (Signed)
Called Pt to re-schedule AWV with [facility] - knb ° °

## 2017-03-09 ENCOUNTER — Ambulatory Visit (INDEPENDENT_AMBULATORY_CARE_PROVIDER_SITE_OTHER): Payer: Medicare Other | Admitting: Family Medicine

## 2017-03-09 ENCOUNTER — Ambulatory Visit: Payer: Medicare Other | Admitting: Family Medicine

## 2017-03-09 ENCOUNTER — Ambulatory Visit: Payer: Medicare Other

## 2017-03-09 VITALS — BP 124/62 | HR 82 | Temp 99.2°F | Resp 16 | Wt 235.0 lb

## 2017-03-09 DIAGNOSIS — I25810 Atherosclerosis of coronary artery bypass graft(s) without angina pectoris: Secondary | ICD-10-CM | POA: Diagnosis not present

## 2017-03-09 DIAGNOSIS — F3289 Other specified depressive episodes: Secondary | ICD-10-CM

## 2017-03-09 DIAGNOSIS — I10 Essential (primary) hypertension: Secondary | ICD-10-CM

## 2017-03-09 DIAGNOSIS — Z794 Long term (current) use of insulin: Secondary | ICD-10-CM | POA: Diagnosis not present

## 2017-03-09 DIAGNOSIS — E119 Type 2 diabetes mellitus without complications: Secondary | ICD-10-CM

## 2017-03-09 DIAGNOSIS — M654 Radial styloid tenosynovitis [de Quervain]: Secondary | ICD-10-CM | POA: Diagnosis not present

## 2017-03-09 DIAGNOSIS — I6523 Occlusion and stenosis of bilateral carotid arteries: Secondary | ICD-10-CM | POA: Diagnosis not present

## 2017-03-09 DIAGNOSIS — E039 Hypothyroidism, unspecified: Secondary | ICD-10-CM

## 2017-03-09 DIAGNOSIS — E7849 Other hyperlipidemia: Secondary | ICD-10-CM

## 2017-03-09 DIAGNOSIS — I480 Paroxysmal atrial fibrillation: Secondary | ICD-10-CM

## 2017-03-09 DIAGNOSIS — E784 Other hyperlipidemia: Secondary | ICD-10-CM

## 2017-03-09 LAB — POCT GLYCOSYLATED HEMOGLOBIN (HGB A1C): Hemoglobin A1C: 8.4

## 2017-03-09 NOTE — Patient Instructions (Signed)
Increase Novolog to 18 units 3 times daily and Lantus to 32 units daily.

## 2017-03-09 NOTE — Progress Notes (Signed)
Patient: Michael Lucas Male    DOB: 06-26-1946   71 y.o.   MRN: [id number] Visit Date: 03/09/2017  Today's Provider: Wilhemena Durie, MD   Chief Complaint  Patient presents with  . Diabetes  . Hypertension  . Hypothyroidism   Subjective:    HPI  Patient is here for follow up. LOV for routine follow up was on 09/01/16. Routine labs were done on 04/01/16   checking sugar in the afternoon before eating and eadings have been around 230.   Diabetes Mellitus Type II, Follow-up:   Lab Results  Component Value Date   HGBA1C 7.8 09/01/2016   HGBA1C 9.0 05/15/2015   HGBA1C (H) 01/25/2009    7.3 (NOTE)   The ADA recommends the following therapeutic goal for glycemic   control related to Hgb A1C measurement:   Goal of Therapy:   < 7.0% Hgb A1C   Reference: American Diabetes Association: Clinical Practice   Recommendations 2008, Diabetes Care,  2008, 31:(Suppl 1).       Component Value Date/Time   CHOL 141 04/01/2016 1505   TRIG 289 (H) 04/01/2016 1505   HDL 39 (L) 04/01/2016 1505   LDLCALC 44 04/01/2016 1505   CREATININE 1.19 04/01/2016 1505   CREATININE 2.04 (H) 10/26/2013 0554   CREATININE 1.23 11/02/2011 0858    Wt Readings from Last 3 Encounters:  03/09/17 235 lb (106.6 kg)  01/20/17 233 lb (105.7 kg)  12/31/16 228 lb (103.4 kg)    ------------------------------------------------------------------------   Hypothyroid, follow-up:  TSH  Date Value Ref Range Status  09/01/2016 4.840 (H) 0.450 - 4.500 uIU/mL Final  02/06/2015 4.610 (H) 0.450 - 4.500 uIU/mL Final   Depression: patient states overall he is doing ok. He has had some bad days since last visit but feels like he is doing stable at this time.  Saw Carles Collet, PA in February 2018 and was diagnosed with Tennis Must Quervain's tenosynovitis on the right. He was referred to Dr Mack Guise and has had injections done which helped some.   No Known Allergies   Current Outpatient Prescriptions:  .   atorvastatin (LIPITOR) 80 MG tablet, Take 80 mg by mouth every evening. , Disp: , Rfl:  .  cholecalciferol (VITAMIN D) 1000 units tablet, Take 1,000 Units by mouth daily., Disp: , Rfl:  .  docusate sodium (COLACE) 50 MG capsule, Take 150 mg by mouth daily., Disp: , Rfl:  .  Emollient (EUCERIN PLUS) 2.5-10 % CREA, Apply 1 application topically daily as needed (dry skin). , Disp: , Rfl:  .  feeding supplement, GLUCERNA SHAKE, (GLUCERNA SHAKE) LIQD, Take 237 mLs by mouth 2 (two) times daily., Disp: , Rfl:  .  furosemide (LASIX) 20 MG tablet, Take 20 mg by mouth daily., Disp: , Rfl:  .  insulin aspart (NOVOLOG) 100 UNIT/ML injection, Inject 15 Units into the skin 3 (three) times daily with meals. , Disp: , Rfl:  .  insulin glargine (LANTUS) 100 UNIT/ML injection, Inject 30 Units into the skin at bedtime. , Disp: , Rfl:  .  ketoconazole (NIZORAL) 2 % shampoo, Apply 1 application topically See admin instructions. Apply shampoo to head every other day, Disp: , Rfl:  .  levothyroxine (SYNTHROID, LEVOTHROID) 50 MCG tablet, Take 50 mcg by mouth daily before breakfast. , Disp: , Rfl:  .  loratadine (ALLERGY RELIEF) 10 MG tablet, Take 10 mg by mouth daily as needed for allergies., Disp: , Rfl:  .  methadone (DOLOPHINE) 10 MG tablet,  Take 10 mg by mouth 2 (two) times daily. , Disp: , Rfl:  .  metoprolol (LOPRESSOR) 100 MG tablet, Take 1 tablet (100 mg total) by mouth 2 (two) times daily., Disp: 60 tablet, Rfl: 6 .  niacin (NIASPAN) 500 MG CR tablet, Take 2 tablets by mouth daily at 12 noon. , Disp: , Rfl:  .  prazosin (MINIPRESS) 2 MG capsule, Take 4 mg by mouth at bedtime. , Disp: , Rfl:  .  pregabalin (LYRICA) 75 MG capsule, Take 75 mg by mouth 2 (two) times daily., Disp: , Rfl:  .  QUEtiapine (SEROQUEL) 100 MG tablet, Take 50 mg by mouth at bedtime., Disp: , Rfl:  .  rivaroxaban (XARELTO) 20 MG TABS tablet, Take 1 tablet (20 mg total) by mouth daily with supper., Disp: 30 tablet, Rfl: 6 .  sertraline  (ZOLOFT) 100 MG tablet, Take 50 mg by mouth daily., Disp: , Rfl:  .  tacrolimus (PROTOPIC) 0.03 % ointment, Apply 1 application topically every other day. Reported on 04/01/2016, Disp: , Rfl:   Review of Systems  Constitutional: Positive for fatigue (about the same).  HENT: Negative.   Eyes: Negative.   Respiratory: Positive for shortness of breath. Negative for choking and wheezing.   Cardiovascular: Negative.   Endocrine: Negative.   Musculoskeletal: Positive for arthralgias, joint swelling and neck pain.  Allergic/Immunologic: Negative.   Neurological: Positive for dizziness (with chnage of position.).  Psychiatric/Behavioral: Positive for sleep disturbance.       Feels stable at this time.    Social History  Substance Use Topics  . Smoking status: Former Smoker    Types: Cigarettes  . Smokeless tobacco: Never Used  . Alcohol use No   Objective:   BP 124/62   Pulse 82   Temp 99.2 F (37.3 C)   Resp 16   Wt 235 lb (106.6 kg)   BMI 35.73 kg/m  Vitals:   03/09/17 1457  BP: 124/62  Pulse: 82  Resp: 16  Temp: 99.2 F (37.3 C)  Weight: 235 lb (106.6 kg)     Physical Exam  Constitutional: He is oriented to person, place, and time. He appears well-developed and well-nourished.  HENT:  Head: Normocephalic and atraumatic.  Right Ear: External ear normal.  Left Ear: External ear normal.  Nose: Nose normal.  Eyes: Conjunctivae are normal. Pupils are equal, round, and reactive to light.  Neck: Normal range of motion. Neck supple.  Cardiovascular: Normal rate, regular rhythm, normal heart sounds and intact distal pulses.   No murmur heard. Pulmonary/Chest: Effort normal and breath sounds normal. No respiratory distress. He has no wheezes.  Abdominal: Soft.  Musculoskeletal: He exhibits no edema or tenderness.  Neurological: He is alert and oriented to person, place, and time. No cranial nerve deficit.  Skin: Skin is warm and dry.  Psychiatric: He has a normal mood and  affect. His behavior is normal. Judgment and thought content normal.        Assessment & Plan:     1. De Quervain's tenosynovitis, right Getting better. Discussed this in details. Offered we can refer patient to hand surgeon for further work up if patient wants it at any time. At this time advised patient to wear the brace and follow for now.  2. Essential hypertension, benign Stable at this time.  3. Type 2 diabetes mellitus treated with insulin (HCC) A1C 8.4. Worse. Will let patient work on habits. Re access on the next visit. Advised patient to increase Novolog to  18 units 3 times daily and Lantus to 32 units daily.  4. Atherosclerosis of coronary artery bypass graft of native heart without angina pectoris  5. Paroxysmal atrial fibrillation (HCC  6. Adult hypothyroidism  7. Other hyperlipidemia 8. Other depression Stable.    Patient seen and examined by Miguel Aschoff, MD, and note scribed by Renaldo Fiddler, CMA and Theressa Millard, RMA. I have done the exam and reviewed the above chart and it is accurate to the best of my knowledge. Development worker, community has been used in this note in any air is in the dictation or transcription are unintentional.  Wilhemena Durie, MD  [facility]

## 2017-07-12 ENCOUNTER — Ambulatory Visit: Payer: Medicare Other | Admitting: Family Medicine

## 2017-07-12 ENCOUNTER — Telehealth: Payer: Self-pay | Admitting: Family Medicine

## 2017-07-12 NOTE — Telephone Encounter (Signed)
Left message regarding need to schedule AWV w [facility]

## 2017-07-19 ENCOUNTER — Ambulatory Visit (INDEPENDENT_AMBULATORY_CARE_PROVIDER_SITE_OTHER): Payer: Medicare Other | Admitting: Family Medicine

## 2017-07-19 VITALS — BP 114/60 | HR 60 | Resp 14 | Wt 230.0 lb

## 2017-07-19 DIAGNOSIS — E119 Type 2 diabetes mellitus without complications: Secondary | ICD-10-CM | POA: Diagnosis not present

## 2017-07-19 DIAGNOSIS — Z1321 Encounter for screening for nutritional disorder: Secondary | ICD-10-CM

## 2017-07-19 DIAGNOSIS — E1021 Type 1 diabetes mellitus with diabetic nephropathy: Secondary | ICD-10-CM | POA: Diagnosis not present

## 2017-07-19 DIAGNOSIS — Z1211 Encounter for screening for malignant neoplasm of colon: Secondary | ICD-10-CM

## 2017-07-19 DIAGNOSIS — N183 Chronic kidney disease, stage 3 unspecified: Secondary | ICD-10-CM

## 2017-07-19 DIAGNOSIS — E039 Hypothyroidism, unspecified: Secondary | ICD-10-CM | POA: Diagnosis not present

## 2017-07-19 DIAGNOSIS — I10 Essential (primary) hypertension: Secondary | ICD-10-CM | POA: Diagnosis not present

## 2017-07-19 DIAGNOSIS — I6523 Occlusion and stenosis of bilateral carotid arteries: Secondary | ICD-10-CM | POA: Diagnosis not present

## 2017-07-19 DIAGNOSIS — I872 Venous insufficiency (chronic) (peripheral): Secondary | ICD-10-CM

## 2017-07-19 DIAGNOSIS — I119 Hypertensive heart disease without heart failure: Secondary | ICD-10-CM | POA: Diagnosis not present

## 2017-07-19 DIAGNOSIS — Z794 Long term (current) use of insulin: Secondary | ICD-10-CM | POA: Diagnosis not present

## 2017-07-19 DIAGNOSIS — E559 Vitamin D deficiency, unspecified: Secondary | ICD-10-CM

## 2017-07-19 NOTE — Progress Notes (Signed)
Michael Lucas  MRN: [id number] DOB: 1946-04-13  Subjective:  HPI   Patient is a 71 year old male who presents for follow up of his diabetes.  He was last seen on 03/09/17 and his A1C at that time was not at goal with a reading of 8.4.  The patient was instructed to increase his Novolog insulin to 18 iu TID and his Lantus to 32 iu. The patient states he checks his glucose at home and he gets readings that are mostly in the low 200. Patient also had labs ordered on his last visit that he has not yet had done.   Patient Active Problem List   Diagnosis Date Noted  . Hypertensive heart disease   . Chronic diastolic CHF (congestive heart failure) ([place])   . Paroxysmal atrial fibrillation (HCC)   . CKD (chronic kidney disease), stage III   . Coronary artery disease   . Absolute anemia 04/03/2015  . Arthritis 04/03/2015  . Chronic pain associated with significant psychosocial dysfunction 04/03/2015  . Constipation 04/03/2015  . Coronary artery abnormality 04/03/2015  . Type 2 diabetes mellitus treated with insulin ([place]) 04/03/2015  . Essential (primary) hypertension 04/03/2015  . Broken fibula 04/03/2015  . Acid reflux 04/03/2015  . Adult hypothyroidism 04/03/2015  . Acute kidney failure ([place]) 04/03/2015  . Calculus of kidney 04/03/2015  . Neuropathy 04/03/2015  . Arthritis, degenerative 04/03/2015  . 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 AF (paroxysmal atrial fibrillation) ([place]) 04/03/2015  . Decreased potassium in the blood 04/03/2015  . Neurosis, posttraumatic 04/03/2015  . Contusion of chest wall 04/03/2015  . Dermatitis seborrheica 04/03/2015  . Episode of syncope 04/03/2015  . Malaise 04/03/2015  . Clinical depression 04/03/2015  . Bilateral carotid artery stenosis 02/05/2014  . Orthostatic hypotension 06/02/2012  . Carotid bruit 11/05/2011  . Hyperlipidemia 04/10/2011  . UNSTEADY GAIT 06/24/2010  . DIZZINESS 01/21/2010  . ATRIAL FIBRILLATION 09/08/2009  . OCCLUSION&STENOS CAROTID  ART W/O MENTION INFARCT 09/08/2009  . DIASTOLIC HEART FAILURE, CHRONIC 08/12/2009  . EDEMA 08/12/2009  . Essential hypertension, benign 06/16/2009  . CAD, ARTERY BYPASS GRAFT 06/16/2009  . ATRIAL FLUTTER 06/16/2009  . LEG PAIN, BILATERAL 06/16/2009    Past Medical History:  Diagnosis Date  . Anemia   . Ankle fracture, left   . Anxiety   . Arthritis   . Atrial flutter ([place])    a. 12/2008 s/p RFCA. Holter monitor 9/20 showed predominantly NSR with some short runs of atrial fibrillation. Coumadin stopped 7/11 due to frequent falls.  . Carotid arterial disease ([place])    a. 11/2011 U/S: 0-39% bilat ICA stenosis;  b. 03/2013 Carotid U/S: 0-39% bilat ICA stenosis.  . Cervical disc disease   . Chronic back pain   . Chronic diastolic CHF (congestive heart failure) ([place])    a. 07/2009 Echo: EF 50-55%, mild LVH, grade I DD, mild MR; b. 04/2015 Echo: Ef 65-70%, triv AI.  Marland Kitchen Chronic neck pain   . CKD (chronic kidney disease), stage III    stage III  . Coronary artery disease    a. 1995 s/p CABG x 1 (LIMA->LAD);  b. 04/2001 Cath: LAD 100, patent LIMA->LAD, otw nonobs LCX/RCA dzs.  . Depression   . Dermatitis   . Diabetes mellitus   . Dyspnea   . Dysrhythmia   . Edema    FEET/LEGS  . Fractured fibula   . Heart murmur   . Hypertensive heart disease   . Hypothyroidism   . Nephrolithiasis    h/o  .  Orthopnea    2-3 PILLOWS  . OSA (obstructive sleep apnea)    a. Unable to tolerate CPAP because of allergic reaction to straps of face mask.  . Paroxysmal atrial fibrillation ([place])    a. 07/2009 PAF noted on holter; b. 05/2010 Coumadin d/c'd 2/2 falls; c. 02/2015 CHA2DS2VASc = 5-->Xarelto.  . Peripheral neuropathy ([place])    suspect diabetes related. has led to gait instability. seen by [facility] neurology, head MRI showed only mild diffuse atrophy with moderate peri-sylvan atrophy.  Marland Kitchen PTSD (post-traumatic stress disorder)    takes prazosin for this.  . Pulmonary embolism ([place]) 2010  . Stroke [facility])     POSSIBLE TIA    Social History   Social History  . Marital status: Widowed    Spouse name: N/A  . Number of children: N/A  . Years of education: N/A   Occupational History  . Disabled Self Employed   Social History Main Topics  . Smoking status: Former Smoker    Types: Cigarettes  . Smokeless tobacco: Never Used  . Alcohol use No  . Drug use: No  . Sexual activity: Not on file   Other Topics Concern  . Not on file   Social History Narrative   Lives in [place].    Outpatient Encounter Prescriptions as of 07/19/2017  Medication Sig  . atorvastatin (LIPITOR) 80 MG tablet Take 80 mg by mouth every evening.   . cholecalciferol (VITAMIN D) 1000 units tablet Take 1,000 Units by mouth daily.  Marland Kitchen docusate sodium (COLACE) 50 MG capsule Take 150 mg by mouth daily.  . Emollient (EUCERIN PLUS) 2.5-10 % CREA Apply 1 application topically daily as needed (dry skin).   . feeding supplement, GLUCERNA SHAKE, (GLUCERNA SHAKE) LIQD Take 237 mLs by mouth 2 (two) times daily.  . furosemide (LASIX) 20 MG tablet Take 20 mg by mouth daily.  . insulin aspart (NOVOLOG) 100 UNIT/ML injection Inject 18 Units into the skin 3 (three) times daily with meals.   . insulin glargine (LANTUS) 100 UNIT/ML injection Inject 32 Units into the skin at bedtime.   Marland Kitchen ketoconazole (NIZORAL) 2 % shampoo Apply 1 application topically See admin instructions. Apply shampoo to head every other day  . levothyroxine (SYNTHROID, LEVOTHROID) 50 MCG tablet Take 50 mcg by mouth daily before breakfast.   . loratadine (ALLERGY RELIEF) 10 MG tablet Take 10 mg by mouth daily as needed for allergies.  . methadone (DOLOPHINE) 10 MG tablet Take 10 mg by mouth 2 (two) times daily.   . metoprolol (LOPRESSOR) 100 MG tablet Take 1 tablet (100 mg total) by mouth 2 (two) times daily.  . niacin (NIASPAN) 500 MG CR tablet Take 2 tablets by mouth daily at 12 noon.   . prazosin (MINIPRESS) 2 MG capsule Take 4 mg by mouth at bedtime.   .  pregabalin (LYRICA) 75 MG capsule Take 75 mg by mouth 2 (two) times daily.  . QUEtiapine (SEROQUEL) 100 MG tablet Take 50 mg by mouth at bedtime.  . rivaroxaban (XARELTO) 20 MG TABS tablet Take 1 tablet (20 mg total) by mouth daily with supper.  . sertraline (ZOLOFT) 100 MG tablet Take 50 mg by mouth daily.  . tacrolimus (PROTOPIC) 0.03 % ointment Apply 1 application topically every other day. Reported on 04/01/2016   No facility-administered encounter medications on file as of 07/19/2017.     Allergies  Allergen Reactions  . No Known Allergies     Review of Systems  Constitutional: Negative for fever and  malaise/fatigue.  Respiratory: Positive for cough, shortness of breath and wheezing.   Cardiovascular: Positive for orthopnea. Negative for chest pain, palpitations and PND. Leg swelling: controlled by support hose.  Neurological: Negative for dizziness, weakness and headaches.  Endo/Heme/Allergies: Negative.   Psychiatric/Behavioral: Negative.     Objective:  BP 114/60 (BP Location: Right Arm, Patient Position: Sitting, Cuff Size: Normal)   Pulse 60   Resp 14   Wt 230 lb (104.3 kg)   BMI 34.97 kg/m   Physical Exam  Constitutional: He is oriented to person, place, and time and well-developed, well-nourished, and in no distress.  HENT:  Head: Normocephalic and atraumatic.  Eyes: Conjunctivae are normal. No scleral icterus.  Neck: No thyromegaly present.  Cardiovascular: Normal rate, regular rhythm and normal heart sounds.   Pulmonary/Chest: Effort normal and breath sounds normal.  Abdominal: Soft.  Neurological: He is alert and oriented to person, place, and time.  Decreased senssation of toes on diabetic foot exam.  Skin: Skin is warm and dry.  Psychiatric: Mood, memory, affect and judgment normal.    Assessment and Plan :  CAD HTN HLD TIIDM MDD All stable. Diabetic Neuropathy  I have done the exam and reviewed the chart and it is accurate to the best of my  knowledge. Development worker, community has been used and  any errors in dictation or transcription are unintentional. Miguel Aschoff M.D. [facility]

## 2017-07-20 ENCOUNTER — Telehealth: Payer: Self-pay

## 2017-07-20 LAB — COMPREHENSIVE METABOLIC PANEL
ALK PHOS: 87 IU/L (ref 39–117)
ALT: 15 IU/L (ref 0–44)
AST: 18 IU/L (ref 0–40)
Albumin/Globulin Ratio: 1.5 (ref 1.2–2.2)
Albumin: 3.9 g/dL (ref 3.5–4.8)
BILIRUBIN TOTAL: 0.4 mg/dL (ref 0.0–1.2)
BUN/Creatinine Ratio: 17 (ref 10–24)
BUN: 21 mg/dL (ref 8–27)
CHLORIDE: 101 mmol/L (ref 96–106)
CO2: 26 mmol/L (ref 20–29)
CREATININE: 1.26 mg/dL (ref 0.76–1.27)
Calcium: 9.1 mg/dL (ref 8.6–10.2)
GFR calc Af Amer: 65 mL/min/{1.73_m2} (ref >59)
GFR calc non Af Amer: 57 mL/min/{1.73_m2} — ABNORMAL LOW (ref >59)
GLUCOSE: 180 mg/dL — AB (ref 65–99)
Globulin, Total: 2.6 g/dL (ref 1.5–4.5)
Potassium: 4.5 mmol/L (ref 3.5–5.2)
Sodium: 141 mmol/L (ref 134–144)
Total Protein: 6.5 g/dL (ref 6.0–8.5)

## 2017-07-20 LAB — CBC WITH DIFFERENTIAL/PLATELET
BASOS ABS: 0.1 10*3/uL (ref 0.0–0.2)
Basos: 1 %
EOS (ABSOLUTE): 0.4 10*3/uL (ref 0.0–0.4)
Eos: 5 %
Hematocrit: 37.8 % (ref 37.5–51.0)
Hemoglobin: 12 g/dL — ABNORMAL LOW (ref 13.0–17.7)
Immature Grans (Abs): 0 10*3/uL (ref 0.0–0.1)
Immature Granulocytes: 0 %
LYMPHS ABS: 2.2 10*3/uL (ref 0.7–3.1)
Lymphs: 26 %
MCH: 29.3 pg (ref 26.6–33.0)
MCHC: 31.7 g/dL (ref 31.5–35.7)
MCV: 92 fL (ref 79–97)
MONOS ABS: 0.9 10*3/uL (ref 0.1–0.9)
Monocytes: 10 %
NEUTROS ABS: 5 10*3/uL (ref 1.4–7.0)
Neutrophils: 58 %
Platelets: 191 10*3/uL (ref 150–379)
RBC: 4.1 x10E6/uL — AB (ref 4.14–5.80)
RDW: 13.6 % (ref 12.3–15.4)
WBC: 8.6 10*3/uL (ref 3.4–10.8)

## 2017-07-20 LAB — LIPID PANEL WITH LDL/HDL RATIO
Cholesterol, Total: 115 mg/dL (ref 100–199)
HDL: 42 mg/dL (ref >39)
LDL Calculated: 42 mg/dL (ref 0–99)
LDl/HDL Ratio: 1 ratio (ref 0.0–3.6)
Triglycerides: 156 mg/dL — ABNORMAL HIGH (ref 0–149)
VLDL Cholesterol Cal: 31 mg/dL (ref 5–40)

## 2017-07-20 LAB — HEMOGLOBIN A1C
ESTIMATED AVERAGE GLUCOSE: 177 mg/dL
HEMOGLOBIN A1C: 7.8 % — AB (ref 4.8–5.6)

## 2017-07-20 LAB — MAGNESIUM: Magnesium: 1.8 mg/dL (ref 1.6–2.3)

## 2017-07-20 LAB — TSH: TSH: 5.07 u[IU]/mL — ABNORMAL HIGH (ref 0.450–4.500)

## 2017-07-20 NOTE — Telephone Encounter (Signed)
Patient called office stating that he was returning CMA's call , he states that he has already been advised of his lab results and was unsure who had given him a call. KW

## 2017-07-20 NOTE — Telephone Encounter (Signed)
Pt advised on his voicemail that looks like we were calling about his lab results, do not see a message about anything else.-aa

## 2017-07-29 ENCOUNTER — Other Ambulatory Visit: Payer: Self-pay

## 2017-07-29 ENCOUNTER — Telehealth: Payer: Self-pay

## 2017-07-29 DIAGNOSIS — Z86010 Personal history of colonic polyps: Secondary | ICD-10-CM

## 2017-07-29 NOTE — Telephone Encounter (Signed)
Gastroenterology Pre-Procedure Review  Request Date: 9/21 Requesting Physician: Dr. Allen Norris  *No major illnesses at this time.  PATIENT REVIEW QUESTIONS: The patient responded to the following health history questions as indicated:    1. Are you having any GI issues? no 2. Do you have a personal history of Polyps? yes (removed +5 years prior) 3. Do you have a family history of Colon Cancer or Polyps? yes (sister: colon cancer) 4. Diabetes Mellitus? no 5. Joint replacements in the past 12 months?no 6. Major health problems in the past 3 months?no 7. Any artificial heart valves, MVP, or defibrillator?yes (Bypass & CHF)    MEDICATIONS & ALLERGIES:    Patient reports the following regarding taking any anticoagulation/antiplatelet therapy:   Plavix, Coumadin, Eliquis, Xarelto, Lovenox, Pradaxa, Brilinta, or Effient? yes (Xarelto) Aspirin? no  Patient confirms/reports the following medications:  Current Outpatient Prescriptions  Medication Sig Dispense Refill  . atorvastatin (LIPITOR) 80 MG tablet Take 80 mg by mouth every evening.     . cholecalciferol (VITAMIN D) 1000 units tablet Take 1,000 Units by mouth daily.    Marland Kitchen docusate sodium (COLACE) 50 MG capsule Take 150 mg by mouth daily.    . Emollient (EUCERIN PLUS) 2.5-10 % CREA Apply 1 application topically daily as needed (dry skin).     . feeding supplement, GLUCERNA SHAKE, (GLUCERNA SHAKE) LIQD Take 237 mLs by mouth 2 (two) times daily.    . furosemide (LASIX) 20 MG tablet Take 20 mg by mouth daily.    . insulin aspart (NOVOLOG) 100 UNIT/ML injection Inject 18 Units into the skin 3 (three) times daily with meals.     . insulin glargine (LANTUS) 100 UNIT/ML injection Inject 32 Units into the skin at bedtime.     Marland Kitchen ketoconazole (NIZORAL) 2 % shampoo Apply 1 application topically See admin instructions. Apply shampoo to head every other day    . levothyroxine (SYNTHROID, LEVOTHROID) 50 MCG tablet Take 50 mcg by mouth daily before breakfast.      . loratadine (ALLERGY RELIEF) 10 MG tablet Take 10 mg by mouth daily as needed for allergies.    . methadone (DOLOPHINE) 10 MG tablet Take 10 mg by mouth 2 (two) times daily.     . metoprolol (LOPRESSOR) 100 MG tablet Take 1 tablet (100 mg total) by mouth 2 (two) times daily. 60 tablet 6  . niacin (NIASPAN) 500 MG CR tablet Take 2 tablets by mouth daily at 12 noon.     . prazosin (MINIPRESS) 2 MG capsule Take 4 mg by mouth at bedtime.     . pregabalin (LYRICA) 75 MG capsule Take 75 mg by mouth 2 (two) times daily.    . QUEtiapine (SEROQUEL) 100 MG tablet Take 50 mg by mouth at bedtime.    . rivaroxaban (XARELTO) 20 MG TABS tablet Take 1 tablet (20 mg total) by mouth daily with supper. 30 tablet 6  . sertraline (ZOLOFT) 100 MG tablet Take 50 mg by mouth daily.    . tacrolimus (PROTOPIC) 0.03 % ointment Apply 1 application topically every other day. Reported on 04/01/2016     No current facility-administered medications for this visit.     Patient confirms/reports the following allergies:  Allergies  Allergen Reactions  . No Known Allergies     No orders of the defined types were placed in this encounter.   AUTHORIZATION INFORMATION Primary Insurance: 1D#: Group #:  Secondary Insurance: 1D#: Group #:  SCHEDULE INFORMATION: Date: 9/21 Time: Location: [place]

## 2017-08-02 ENCOUNTER — Telehealth: Payer: Self-pay | Admitting: Cardiovascular Disease

## 2017-08-02 NOTE — Telephone Encounter (Signed)
Request to hold xarelto before 9/21 colonoscopy with Dr. Vicente Males, [facility] placed in MD basket.

## 2017-08-05 ENCOUNTER — Telehealth: Payer: Self-pay

## 2017-08-05 NOTE — Telephone Encounter (Signed)
Faxed blood thinner information request to [facility]. Pt may stop xarelto 2 days prior to procedure and restart 1 day after.

## 2017-08-05 NOTE — Telephone Encounter (Signed)
LVM for patient callback for medication clearance instructions per Dr. Fletcher Anon.  Stop Xarelto 2 days prior to procedure.  Restart 1 day following.

## 2017-08-22 ENCOUNTER — Telehealth: Payer: Self-pay

## 2017-08-22 ENCOUNTER — Telehealth: Payer: Self-pay | Admitting: Family Medicine

## 2017-08-22 NOTE — Telephone Encounter (Signed)
LVM for patient.  Patient to stop Xarelto 2 days prior to procedure and restart 1 day following.

## 2017-08-23 ENCOUNTER — Encounter: Payer: Self-pay | Admitting: *Deleted

## 2017-08-25 NOTE — Discharge Instructions (Signed)
General Anesthesia, Adult, Care After °These instructions provide you with information about caring for yourself after your procedure. Your health care provider may also give you more specific instructions. Your treatment has been planned according to current medical practices, but problems sometimes occur. Call your health care provider if you have any problems or questions after your procedure. °What can I expect after the procedure? °After the procedure, it is common to have: °· Vomiting. °· A sore throat. °· Mental slowness. ° °It is common to feel: °· Nauseous. °· Cold or shivery. °· Sleepy. °· Tired. °· Sore or achy, even in parts of your body where you did not have surgery. ° °Follow these instructions at home: °For at least 24 hours after the procedure: °· Do not: °? Participate in activities where you could fall or become injured. °? Drive. °? Use heavy machinery. °? Drink alcohol. °? Take sleeping pills or medicines that cause drowsiness. °? Make important decisions or sign legal documents. °? Take care of children on your own. °· Rest. °Eating and drinking °· If you vomit, drink water, juice, or soup when you can drink without vomiting. °· Drink enough fluid to keep your urine clear or pale yellow. °· Make sure you have little or no nausea before eating solid foods. °· Follow the diet recommended by your health care provider. °General instructions °· Have a responsible adult stay with you until you are awake and alert. °· Return to your normal activities as told by your health care provider. Ask your health care provider what activities are safe for you. °· Take over-the-counter and prescription medicines only as told by your health care provider. °· If you smoke, do not smoke without supervision. °· Keep all follow-up visits as told by your health care provider. This is important. °Contact a health care provider if: °· You continue to have nausea or vomiting at home, and medicines are not helpful. °· You  cannot drink fluids or start eating again. °· You cannot urinate after 8-12 hours. °· You develop a skin rash. °· You have fever. °· You have increasing redness at the site of your procedure. °Get help right away if: °· You have difficulty breathing. °· You have chest pain. °· You have unexpected bleeding. °· You feel that you are having a life-threatening or urgent problem. °This information is not intended to replace advice given to you by your health care provider. Make sure you discuss any questions you have with your health care provider. °Document Released: 02/21/2001 Document Revised: 04/19/2016 Document Reviewed: 10/30/2015 °Elsevier Interactive Patient Education © 2018 [employer] ° °

## 2017-08-26 ENCOUNTER — Ambulatory Visit
Admission: RE | Admit: 2017-08-26 | Discharge: 2017-08-26 | Disposition: A | Discharge status: Home / Self Care | Payer: Medicare Other | Source: Ambulatory Visit | Attending: Gastroenterology | Admitting: Gastroenterology

## 2017-08-26 ENCOUNTER — Ambulatory Visit: Payer: Medicare Other | Admitting: Anesthesiology

## 2017-08-26 ENCOUNTER — Encounter
Admission: RE | Disposition: A | Discharge status: Home / Self Care | Payer: Self-pay | Source: Ambulatory Visit | Attending: Gastroenterology

## 2017-08-26 DIAGNOSIS — F4310 Post-traumatic stress disorder, unspecified: Secondary | ICD-10-CM | POA: Insufficient documentation

## 2017-08-26 DIAGNOSIS — Z8673 Personal history of transient ischemic attack (TIA), and cerebral infarction without residual deficits: Secondary | ICD-10-CM | POA: Insufficient documentation

## 2017-08-26 DIAGNOSIS — M549 Dorsalgia, unspecified: Secondary | ICD-10-CM | POA: Insufficient documentation

## 2017-08-26 DIAGNOSIS — D121 Benign neoplasm of appendix: Secondary | ICD-10-CM | POA: Insufficient documentation

## 2017-08-26 DIAGNOSIS — I5032 Chronic diastolic (congestive) heart failure: Secondary | ICD-10-CM | POA: Insufficient documentation

## 2017-08-26 DIAGNOSIS — D631 Anemia in chronic kidney disease: Secondary | ICD-10-CM | POA: Insufficient documentation

## 2017-08-26 DIAGNOSIS — R0601 Orthopnea: Secondary | ICD-10-CM | POA: Insufficient documentation

## 2017-08-26 DIAGNOSIS — Z86711 Personal history of pulmonary embolism: Secondary | ICD-10-CM | POA: Insufficient documentation

## 2017-08-26 DIAGNOSIS — E1122 Type 2 diabetes mellitus with diabetic chronic kidney disease: Secondary | ICD-10-CM | POA: Insufficient documentation

## 2017-08-26 DIAGNOSIS — E039 Hypothyroidism, unspecified: Secondary | ICD-10-CM | POA: Insufficient documentation

## 2017-08-26 DIAGNOSIS — Z794 Long term (current) use of insulin: Secondary | ICD-10-CM | POA: Insufficient documentation

## 2017-08-26 DIAGNOSIS — I6523 Occlusion and stenosis of bilateral carotid arteries: Secondary | ICD-10-CM | POA: Insufficient documentation

## 2017-08-26 DIAGNOSIS — G8929 Other chronic pain: Secondary | ICD-10-CM | POA: Insufficient documentation

## 2017-08-26 DIAGNOSIS — D124 Benign neoplasm of descending colon: Secondary | ICD-10-CM | POA: Insufficient documentation

## 2017-08-26 DIAGNOSIS — Z9841 Cataract extraction status, right eye: Secondary | ICD-10-CM | POA: Insufficient documentation

## 2017-08-26 DIAGNOSIS — Z801 Family history of malignant neoplasm of trachea, bronchus and lung: Secondary | ICD-10-CM | POA: Insufficient documentation

## 2017-08-26 DIAGNOSIS — I480 Paroxysmal atrial fibrillation: Secondary | ICD-10-CM | POA: Insufficient documentation

## 2017-08-26 DIAGNOSIS — Z86010 Personal history of colonic polyps: Secondary | ICD-10-CM | POA: Insufficient documentation

## 2017-08-26 DIAGNOSIS — R011 Cardiac murmur, unspecified: Secondary | ICD-10-CM | POA: Diagnosis not present

## 2017-08-26 DIAGNOSIS — I499 Cardiac arrhythmia, unspecified: Secondary | ICD-10-CM | POA: Insufficient documentation

## 2017-08-26 DIAGNOSIS — Z8249 Family history of ischemic heart disease and other diseases of the circulatory system: Secondary | ICD-10-CM | POA: Insufficient documentation

## 2017-08-26 DIAGNOSIS — D120 Benign neoplasm of cecum: Secondary | ICD-10-CM | POA: Diagnosis not present

## 2017-08-26 DIAGNOSIS — Z87442 Personal history of urinary calculi: Secondary | ICD-10-CM | POA: Diagnosis not present

## 2017-08-26 DIAGNOSIS — D123 Benign neoplasm of transverse colon: Secondary | ICD-10-CM | POA: Insufficient documentation

## 2017-08-26 DIAGNOSIS — F329 Major depressive disorder, single episode, unspecified: Secondary | ICD-10-CM | POA: Diagnosis not present

## 2017-08-26 DIAGNOSIS — I130 Hypertensive heart and chronic kidney disease with heart failure and stage 1 through stage 4 chronic kidney disease, or unspecified chronic kidney disease: Secondary | ICD-10-CM | POA: Insufficient documentation

## 2017-08-26 DIAGNOSIS — R0600 Dyspnea, unspecified: Secondary | ICD-10-CM | POA: Insufficient documentation

## 2017-08-26 DIAGNOSIS — I2510 Atherosclerotic heart disease of native coronary artery without angina pectoris: Secondary | ICD-10-CM | POA: Insufficient documentation

## 2017-08-26 DIAGNOSIS — M542 Cervicalgia: Secondary | ICD-10-CM | POA: Diagnosis not present

## 2017-08-26 DIAGNOSIS — Z7901 Long term (current) use of anticoagulants: Secondary | ICD-10-CM | POA: Insufficient documentation

## 2017-08-26 DIAGNOSIS — F419 Anxiety disorder, unspecified: Secondary | ICD-10-CM | POA: Insufficient documentation

## 2017-08-26 DIAGNOSIS — Z800 Family history of malignant neoplasm of digestive organs: Secondary | ICD-10-CM | POA: Insufficient documentation

## 2017-08-26 DIAGNOSIS — Z9842 Cataract extraction status, left eye: Secondary | ICD-10-CM | POA: Insufficient documentation

## 2017-08-26 DIAGNOSIS — K5790 Diverticulosis of intestine, part unspecified, without perforation or abscess without bleeding: Secondary | ICD-10-CM | POA: Diagnosis not present

## 2017-08-26 DIAGNOSIS — N183 Chronic kidney disease, stage 3 (moderate): Secondary | ICD-10-CM | POA: Insufficient documentation

## 2017-08-26 DIAGNOSIS — I4892 Unspecified atrial flutter: Secondary | ICD-10-CM | POA: Diagnosis not present

## 2017-08-26 DIAGNOSIS — Z951 Presence of aortocoronary bypass graft: Secondary | ICD-10-CM | POA: Insufficient documentation

## 2017-08-26 DIAGNOSIS — Z811 Family history of alcohol abuse and dependence: Secondary | ICD-10-CM | POA: Insufficient documentation

## 2017-08-26 DIAGNOSIS — E1142 Type 2 diabetes mellitus with diabetic polyneuropathy: Secondary | ICD-10-CM | POA: Diagnosis not present

## 2017-08-26 DIAGNOSIS — G4733 Obstructive sleep apnea (adult) (pediatric): Secondary | ICD-10-CM | POA: Insufficient documentation

## 2017-08-26 DIAGNOSIS — Z1211 Encounter for screening for malignant neoplasm of colon: Secondary | ICD-10-CM | POA: Diagnosis not present

## 2017-08-26 DIAGNOSIS — Z87891 Personal history of nicotine dependence: Secondary | ICD-10-CM | POA: Insufficient documentation

## 2017-08-26 DIAGNOSIS — D122 Benign neoplasm of ascending colon: Secondary | ICD-10-CM | POA: Diagnosis not present

## 2017-08-26 DIAGNOSIS — K635 Polyp of colon: Secondary | ICD-10-CM | POA: Diagnosis not present

## 2017-08-26 HISTORY — PX: POLYPECTOMY: SHX5525

## 2017-08-26 HISTORY — PX: COLONOSCOPY WITH PROPOFOL: SHX5780

## 2017-08-26 HISTORY — DX: Presence of dental prosthetic device (complete) (partial): Z97.2

## 2017-08-26 LAB — GLUCOSE, CAPILLARY
GLUCOSE-CAPILLARY: 114 mg/dL — AB (ref 65–99)
Glucose-Capillary: 81 mg/dL (ref 65–99)
Glucose-Capillary: 147 mg/dL — ABNORMAL HIGH (ref 65–99)

## 2017-08-26 SURGERY — COLONOSCOPY WITH PROPOFOL
Anesthesia: General | Wound class: Dirty or Infected

## 2017-08-26 MED ORDER — EPHEDRINE SULFATE 50 MG/ML IJ SOLN
INTRAMUSCULAR | Status: DC | PRN
  Administered 2017-08-26 (×2): 10 mg via INTRAVENOUS
  Administered 2017-08-26: 5 mg via INTRAVENOUS

## 2017-08-26 MED ORDER — LACTATED RINGERS IV SOLN
INTRAVENOUS | Status: DC
  Administered 2017-08-26: 07:00:00 via INTRAVENOUS

## 2017-08-26 MED ORDER — SODIUM CHLORIDE 0.9 % IJ SOLN
INTRAMUSCULAR | Status: DC | PRN
  Administered 2017-08-26: 3 mL via INTRAVENOUS

## 2017-08-26 MED ORDER — LIDOCAINE HCL (CARDIAC) 20 MG/ML IV SOLN
INTRAVENOUS | Status: DC | PRN
  Administered 2017-08-26: 20 mg via INTRAVENOUS

## 2017-08-26 MED ORDER — PHENYLEPHRINE HCL 10 MG/ML IJ SOLN
INTRAMUSCULAR | Status: DC | PRN
  Administered 2017-08-26 (×4): 100 ug via INTRAVENOUS

## 2017-08-26 MED ORDER — PROPOFOL 10 MG/ML IV BOLUS
INTRAVENOUS | Status: DC | PRN
  Administered 2017-08-26: 20 mg via INTRAVENOUS
  Administered 2017-08-26: 10 mg via INTRAVENOUS
  Administered 2017-08-26: 20 mg via INTRAVENOUS
  Administered 2017-08-26: 30 mg via INTRAVENOUS
  Administered 2017-08-26: 20 mg via INTRAVENOUS
  Administered 2017-08-26: 80 mg via INTRAVENOUS

## 2017-08-26 MED ORDER — DEXTROSE 50 % IV SOLN
25.0000 mL | Freq: Once | INTRAVENOUS | Status: AC
Start: 2017-08-26 — End: 2017-08-26
  Administered 2017-08-26: 14 mL via INTRAVENOUS

## 2017-08-26 MED ORDER — STERILE WATER FOR IRRIGATION IR SOLN
Status: DC | PRN
  Administered 2017-08-26: 08:00:00

## 2017-08-26 SURGICAL SUPPLY — 25 items
CANISTER SUCT 1200ML W/VALVE (MISCELLANEOUS) ×4 IMPLANT
CLIP HMST 235XBRD CATH ROT (MISCELLANEOUS) ×6 IMPLANT
CLIP RESOLUTION 360 11X235 (MISCELLANEOUS) ×6
ELECT REM PT RETURN 9FT ADLT (ELECTROSURGICAL) ×4
ELECTRODE REM PT RTRN 9FT ADLT (ELECTROSURGICAL) ×2 IMPLANT
FCP ESCP3.2XJMB 240X2.8X (MISCELLANEOUS)
FORCEPS BIOP RAD 4 LRG CAP 4 (CUTTING FORCEPS) ×4 IMPLANT
FORCEPS BIOP RJ4 240 W/NDL (MISCELLANEOUS)
FORCEPS ESCP3.2XJMB 240X2.8X (MISCELLANEOUS) IMPLANT
GOWN CVR UNV OPN BCK APRN NK (MISCELLANEOUS) ×4 IMPLANT
GOWN ISOL THUMB LOOP REG UNIV (MISCELLANEOUS) ×4
INJECTOR VARIJECT VIN23 (MISCELLANEOUS) IMPLANT
KIT DEFENDO VALVE AND CONN (KITS) IMPLANT
KIT ENDO PROCEDURE OLY (KITS) ×4 IMPLANT
MARKER SPOT ENDO TATTOO 5ML (MISCELLANEOUS) IMPLANT
PAD GROUND ADULT SPLIT (MISCELLANEOUS) IMPLANT
PROBE APC STR FIRE (PROBE) IMPLANT
RETRIEVER NET ROTH 2.5X230 LF (MISCELLANEOUS) IMPLANT
SNARE SHORT THROW 13M SML OVAL (MISCELLANEOUS) ×4 IMPLANT
SNARE SHORT THROW 30M LRG OVAL (MISCELLANEOUS) IMPLANT
SNARE SNG USE RND 15MM (INSTRUMENTS) IMPLANT
SPOT EX ENDOSCOPIC TATTOO (MISCELLANEOUS)
TRAP ETRAP POLY (MISCELLANEOUS) ×4 IMPLANT
VARIJECT INJECTOR VIN23 (MISCELLANEOUS)
WATER STERILE IRR 250ML POUR (IV SOLUTION) ×4 IMPLANT

## 2017-08-26 NOTE — Op Note (Signed)
[facility] Patient Name: Michael Lucas Procedure Date: 08/26/2017 7:57 AM MRN: [id number] Account #: [id number] Date of Birth: 08-21-1946 Admit Type: Outpatient Age: 71 Room: [facility] OR ROOM 01 Gender: Male Note Status: Finalized Procedure:            Colonoscopy Indications:          High risk colon cancer surveillance: Personal history                        of colonic polyps Providers:            Lucilla Lame MD, MD Referring MD:         Janine Ores. Rosanna Randy, MD (Referring MD) Medicines:            Propofol per Anesthesia Complications:        No immediate complications. Procedure:            Pre-Anesthesia Assessment:                       - Prior to the procedure, a History and Physical was                        performed, and patient medications and allergies were                        reviewed. The patient's tolerance of previous                        anesthesia was also reviewed. The risks and benefits of                        the procedure and the sedation options and risks were                        discussed with the patient. All questions were                        answered, and informed consent was obtained. Prior                        Anticoagulants: The patient has taken no previous                        anticoagulant or antiplatelet agents. ASA Grade                        Assessment: II - A patient with mild systemic disease.                        After reviewing the risks and benefits, the patient was                        deemed in satisfactory condition to undergo the                        procedure.                       After obtaining informed consent, the colonoscope was  passed under direct vision. Throughout the procedure,                        the patient's blood pressure, pulse, and oxygen                        saturations were monitored continuously. The Olympus CF   H180AL Colonoscope (S#: [id number]) was introduced through                        the anus and advanced to the the cecum, identified by                        appendiceal orifice and ileocecal valve. The entire                        colon was examined. The colonoscopy was performed                        without difficulty. The patient tolerated the procedure                        well. The quality of the bowel preparation was                        excellent. Findings:      The perianal and digital rectal examinations were normal.      Three sessile polyps were found in the cecum. The polyps were 3 to 4 mm       in size. These polyps were removed with a cold biopsy forceps. Resection       and retrieval were complete.      A 10 mm polyp was found in the cecum. The polyp was sessile. Area was       successfully injected with saline for a lift polypectomy. The polyp was       removed with a hot snare. Resection and retrieval were complete. To       prevent bleeding post-intervention, three hemostatic clips were       successfully placed (MR conditional). There was no bleeding at the end       of the procedure.      Three sessile polyps were found in the ascending colon. The polyps were       4 to 6 mm in size. These polyps were removed with a cold snare.       Resection and retrieval were complete.      Two sessile polyps were found in the descending colon. The polyps were 3       to 4 mm in size. These polyps were removed with a cold snare. Resection       and retrieval were complete.      Multiple small-mouthed diverticula were found in the sigmoid colon.      Two sessile polyps were found in the transverse colon. The polyps were 4       to 5 mm in size. These polyps were removed with a cold snare. Resection       and retrieval were complete. Impression:           - Three 3 to 4 mm polyps in the cecum, removed with a  cold biopsy forceps. Resected and retrieved.                        - One 10 mm polyp in the cecum, removed with a hot                        snare. Resected and retrieved. Injected. Clips (MR                        conditional) were placed.                       - Three 4 to 6 mm polyps in the ascending colon,                        removed with a cold snare. Resected and retrieved.                       - Two 3 to 4 mm polyps in the descending colon, removed                        with a cold snare. Resected and retrieved.                       - Diverticulosis in the sigmoid colon.                       - Two 4 to 5 mm polyps in the transverse colon, removed                        with a cold snare. Resected and retrieved. Recommendation:       - Discharge patient to home.                       - Resume previous diet.                       - Continue present medications.                       - Await pathology results.                       - Repeat colonoscopy in 1 years for surveillance. Procedure Code(s):    --- Professional ---                       [phone], Colonoscopy, flexible; with removal of tumor(s),                        polyp(s), or other lesion(s) by snare technique                       45380, 36, Colonoscopy, flexible; with biopsy, single                        or multiple                       45381, Colonoscopy, flexible; with directed submucosal  injection(s), any substance Diagnosis Code(s):    --- Professional ---                       Z86.010, Personal history of colonic polyps                       D12.0, Benign neoplasm of cecum                       D12.2, Benign neoplasm of ascending colon                       D12.4, Benign neoplasm of descending colon                       D12.3, Benign neoplasm of transverse colon (hepatic                        flexure or splenic flexure) CPT copyright 2016 American Medical Association. All rights reserved. The codes documented in this report are  preliminary and upon coder review may  be revised to meet current compliance requirements. Lucilla Lame MD, MD 08/26/2017 8:49:19 AM This report has been signed electronically. Number of Addenda: 0 Note Initiated On: 08/26/2017 7:57 AM Scope Withdrawal Time: 0 hours 18 minutes 14 seconds  Total Procedure Duration: 0 hours 32 minutes 3 seconds       [facility]

## 2017-08-26 NOTE — Anesthesia Procedure Notes (Signed)
Procedure Name: MAC Date/Time: 08/26/2017 8:06 AM Performed by: Janna Arch Pre-anesthesia Checklist: Patient identified, Emergency Drugs available, Suction available and Patient being monitored Patient Re-evaluated:Patient Re-evaluated prior to induction Oxygen Delivery Method: Nasal cannula

## 2017-08-26 NOTE — H&P (Signed)
Lucilla Lame, MD [address] [place], [place] [address] Phone:[phone] Fax : [phone]  Primary Care Physician:  Jerrol Banana., MD Primary Gastroenterologist:  Dr. Allen Norris  Pre-Procedure History & Physical: HPI:  Michael Lucas is a 71 y.o. male is here for an colonoscopy.   Past Medical History:  Diagnosis Date  . Anemia   . Ankle fracture, left   . Anxiety   . Arthritis   . Atrial flutter ([place])    a. 12/2008 s/p RFCA. Holter monitor 9/20 showed predominantly NSR with some short runs of atrial fibrillation. Coumadin stopped 7/11 due to frequent falls.  . Carotid arterial disease ([place])    a. 11/2011 U/S: 0-39% bilat ICA stenosis;  b. 03/2013 Carotid U/S: 0-39% bilat ICA stenosis.  . Cervical disc disease   . Chronic back pain   . Chronic diastolic CHF (congestive heart failure) ([place])    a. 07/2009 Echo: EF 50-55%, mild LVH, grade I DD, mild MR; b. 04/2015 Echo: Ef 65-70%, triv AI.  Marland Kitchen Chronic neck pain   . CKD (chronic kidney disease), stage III    stage III  . Coronary artery disease    a. 1995 s/p CABG x 1 (LIMA->LAD);  b. 04/2001 Cath: LAD 100, patent LIMA->LAD, otw nonobs LCX/RCA dzs.  . Depression   . Dermatitis   . Diabetes mellitus   . Dyspnea   . Dysrhythmia   . Edema    FEET/LEGS  . Fractured fibula   . Heart murmur   . Hypertensive heart disease   . Hypothyroidism   . Nephrolithiasis    h/o  . Orthopnea    2-3 PILLOWS  . OSA (obstructive sleep apnea)    a. Unable to tolerate CPAP because of allergic reaction to straps of face mask.  . Paroxysmal atrial fibrillation ([place])    a. 07/2009 PAF noted on holter; b. 05/2010 Coumadin d/c'd 2/2 falls; c. 02/2015 CHA2DS2VASc = 5-->Xarelto.  . Peripheral neuropathy    suspect diabetes related. has led to gait instability. seen by [place] neurology, head MRI showed only mild diffuse atrophy with moderate peri-sylvan atrophy.  Marland Kitchen PTSD (post-traumatic stress disorder)    takes prazosin for this.    . Pulmonary embolism ([place]) 2010  . Stroke [facility])    POSSIBLE TIA  . Wears dentures    full upper and lower (only wears upper)    Past Surgical History:  Procedure Laterality Date  . ABLATION     CARDIAC  . CATARACT EXTRACTION W/PHACO Right 11/04/2016   Procedure: CATARACT EXTRACTION PHACO AND INTRAOCULAR LENS PLACEMENT (IOC);  Surgeon: Eulogio Bear, MD;  Location: [facility] ORS;  Service: Ophthalmology;  Laterality: Right;  [place] 53.7 AP% 12.6 CDE 6.75 Fluid pack lot # [id number]  . CATARACT EXTRACTION W/PHACO Left 12/02/2016   Procedure: CATARACT EXTRACTION PHACO AND INTRAOCULAR LENS PLACEMENT (IOC);  Surgeon: Eulogio Bear, MD;  Location: [facility] ORS;  Service: Ophthalmology;  Laterality: Left;  [place] 43.5 AP% 12.5 CDE 5.48 Fluid Pack lot # [id number]  . CORONARY ARTERY BYPASS GRAFT  1995  . HEMORRHOID SURGERY    . HERNIA REPAIR    . ROTATOR CUFF REPAIR    . ROTATOR CUFF REPAIR      Prior to Admission medications   Medication Sig Start Date End Date Taking? Authorizing Provider  atorvastatin (LIPITOR) 80 MG tablet Take 80 mg by mouth every evening.    Yes [provider]  cholecalciferol (VITAMIN D) 1000 units tablet Take 1,000  Units by mouth daily.   Yes [provider]  docusate sodium (COLACE) 50 MG capsule Take 150 mg by mouth daily.   Yes [provider]  insulin aspart (NOVOLOG) 100 UNIT/ML injection Inject 18 Units into the skin 3 (three) times daily with meals.  05/01/14  Yes [provider]  insulin glargine (LANTUS) 100 UNIT/ML injection Inject 32 Units into the skin at bedtime.  05/01/14  Yes [provider]  ketoconazole (NIZORAL) 2 % shampoo Apply 1 application topically See admin instructions. Apply shampoo to head every other day 05/01/14  Yes [provider]  levothyroxine (SYNTHROID, LEVOTHROID) 50 MCG tablet Take 50 mcg by mouth daily before breakfast.  01/17/14  Yes [provider]  loratadine (ALLERGY RELIEF) 10  MG tablet Take 10 mg by mouth daily as needed for allergies.   Yes [provider]  methadone (DOLOPHINE) 10 MG tablet Take 10 mg by mouth 2 (two) times daily.    Yes [provider]  metoprolol (LOPRESSOR) 100 MG tablet Take 1 tablet (100 mg total) by mouth 2 (two) times daily. 03/04/15  Yes Wellington Hampshire, MD  niacin (NIASPAN) 500 MG CR tablet Take 2 tablets by mouth daily at 12 noon.  09/30/10  Yes [provider]  prazosin (MINIPRESS) 2 MG capsule Take 4 mg by mouth at bedtime.    Yes [provider]  pregabalin (LYRICA) 75 MG capsule Take 75 mg by mouth 2 (two) times daily.   Yes [provider]  QUEtiapine (SEROQUEL) 100 MG tablet Take 50 mg by mouth at bedtime.   Yes [provider]  rivaroxaban (XARELTO) 20 MG TABS tablet Take 1 tablet (20 mg total) by mouth daily with supper. 03/04/15  Yes Wellington Hampshire, MD  sertraline (ZOLOFT) 100 MG tablet Take 50 mg by mouth daily.   Yes [provider]  Emollient (EUCERIN PLUS) 2.5-10 % CREA Apply 1 application topically daily as needed (dry skin).     [provider]  feeding supplement, GLUCERNA SHAKE, (GLUCERNA SHAKE) LIQD Take 237 mLs by mouth 2 (two) times daily.    [provider]  furosemide (LASIX) 20 MG tablet Take 20 mg by mouth daily.    [provider]  tacrolimus (PROTOPIC) 0.03 % ointment Apply 1 application topically every other day. Reported on 04/01/2016 05/01/14   [provider]    Allergies as of 07/29/2017 - Review Complete 07/29/2017  Allergen Reaction Noted  . No known allergies      Family History  Problem Relation Age of Onset  . Cancer Mother        Died from lung cancer  . Heart disease Father   . Cancer Sister        Rectal cancer  . Cancer Sister        colon cancer  . Alcohol abuse Sister        died from alcohol poisoning    Social History   Social History  . Marital status: Widowed    Spouse name: N/A  .  Number of children: N/A  . Years of education: N/A   Occupational History  . Disabled Self Employed   Social History Main Topics  . Smoking status: Former Smoker    Types: Cigarettes    Quit date: 70  . Smokeless tobacco: Never Used  . Alcohol use No  . Drug use: No  . Sexual activity: Not on file   Other Topics Concern  . Not  on file   Social History Narrative   Lives in [place].    Review of Systems: See HPI, otherwise negative ROS  Physical Exam: BP (!) 139/96   Pulse 86   Temp 97.9 F (36.6 C)   Ht 5\' 8"  (1.727 m)   Wt 230 lb (104.3 kg)   SpO2 99%   BMI 34.97 kg/m  General:   Alert,  pleasant and cooperative in NAD Head:  Normocephalic and atraumatic. Neck:  Supple; no masses or thyromegaly. Lungs:  Clear throughout to auscultation.    Heart:  Regular rate and rhythm. Abdomen:  Soft, nontender and nondistended. Normal bowel sounds, without guarding, and without rebound.   Neurologic:  Alert and  oriented x4;  grossly normal neurologically.  Impression/Plan: Michael Lucas is here for an colonoscopy to be performed for history of colon polyps  Risks, benefits, limitations, and alternatives regarding  colonoscopy have been reviewed with the patient.  Questions have been answered.  All parties agreeable.   Lucilla Lame, MD  08/26/2017, 7:35 AM

## 2017-08-26 NOTE — Transfer of Care (Signed)
Immediate Anesthesia Transfer of Care Note  Patient: Michael Lucas  Procedure(s) Performed: Procedure(s) with comments: COLONOSCOPY WITH PROPOFOL (N/A) - Diabetic - insulin  Patient Location: PACU  Anesthesia Type: General  Level of Consciousness: awake, alert  and patient cooperative  Airway and Oxygen Therapy: Patient Spontanous Breathing and Patient connected to supplemental oxygen  Post-op Assessment: Post-op Vital signs reviewed, Patient's Cardiovascular Status Stable, Respiratory Function Stable, Patent Airway and No signs of Nausea or vomiting  Post-op Vital Signs: Reviewed and stable  Complications: No apparent anesthesia complications

## 2017-08-26 NOTE — Anesthesia Postprocedure Evaluation (Signed)
Anesthesia Post Note  Patient: Michael Lucas  Procedure(s) Performed: Procedure(s) (LRB): COLONOSCOPY WITH PROPOFOL (N/A)  Patient location during evaluation: PACU Anesthesia Type: General Level of consciousness: awake and alert Pain management: pain level controlled Vital Signs Assessment: post-procedure vital signs reviewed and stable Respiratory status: spontaneous breathing Cardiovascular status: blood pressure returned to baseline Postop Assessment: no headache Anesthetic complications: no    Jaci Standard, III,  Preslyn Warr D

## 2017-08-26 NOTE — Anesthesia Preprocedure Evaluation (Signed)
Anesthesia Evaluation  Patient identified by MRN, date of birth, ID band Patient awake    Reviewed: Allergy & Precautions, H&P , NPO status , Patient's Chart, lab work & pertinent test results  Airway Mallampati: II  TM Distance: >3 FB Neck ROM: full    Dental no notable dental hx.    Pulmonary shortness of breath, sleep apnea , former smoker,    Pulmonary exam normal        Cardiovascular hypertension, + CAD and +CHF  Normal cardiovascular exam+ dysrhythmias Atrial Fibrillation + Valvular Problems/Murmurs      Neuro/Psych CVA    GI/Hepatic Neg liver ROS, Medicated,  Endo/Other  diabetes, Well Controlled, Type 2, Insulin DependentHypothyroidism   Renal/GU      Musculoskeletal   Abdominal   Peds  Hematology   Anesthesia Other Findings   Reproductive/Obstetrics                             Anesthesia Physical Anesthesia Plan  ASA: III  Anesthesia Plan: General   Post-op Pain Management:    Induction:   PONV Risk Score and Plan:   Airway Management Planned:   Additional Equipment:   Intra-op Plan:   Post-operative Plan:   Informed Consent: I have reviewed the patients History and Physical, chart, labs and discussed the procedure including the risks, benefits and alternatives for the proposed anesthesia with the patient or authorized representative who has indicated his/her understanding and acceptance.     Plan Discussed with:   Anesthesia Plan Comments:         Anesthesia Quick Evaluation

## 2017-08-30 ENCOUNTER — Encounter: Payer: Self-pay | Admitting: Gastroenterology

## 2017-08-30 ENCOUNTER — Telehealth: Payer: Self-pay | Admitting: Family Medicine

## 2017-08-31 NOTE — Telephone Encounter (Signed)
Continuing to try to reach for AWV

## 2017-10-27 ENCOUNTER — Telehealth: Payer: Self-pay | Admitting: Family Medicine

## 2017-11-08 ENCOUNTER — Ambulatory Visit: Payer: Medicare Other | Admitting: Family Medicine

## 2017-11-08 ENCOUNTER — Ambulatory Visit: Payer: Medicare Other

## 2017-12-06 ENCOUNTER — Ambulatory Visit: Payer: Medicare Other

## 2017-12-08 NOTE — Telephone Encounter (Signed)
duplicate

## 2017-12-22 ENCOUNTER — Telehealth: Payer: Self-pay

## 2017-12-22 NOTE — Telephone Encounter (Signed)
LMTCB and reschedule AWV with [facility]. Pt previously n/s this apt on 12/06/17. -MM

## 2017-12-27 NOTE — Telephone Encounter (Signed)
Attempt # 2: LMTCB and reschedule AWV with [facility]. -MM

## 2018-01-02 NOTE — Telephone Encounter (Signed)
Pt returned called and is scheduled for AWV on 01/18/18 @ 245 pm. Thanks TNP

## 2018-01-02 NOTE — Telephone Encounter (Signed)
Noted, thank you

## 2018-01-04 NOTE — Telephone Encounter (Signed)
Appt scheduled

## 2018-01-18 ENCOUNTER — Ambulatory Visit: Payer: Medicare Other

## 2018-01-24 ENCOUNTER — Telehealth: Payer: Self-pay

## 2018-01-24 ENCOUNTER — Ambulatory Visit: Payer: Medicare Other

## 2018-01-24 NOTE — Telephone Encounter (Signed)
Pt n/s AWV on 01/24/18 @ 3. LMTCB and reschedule apt.  -MM

## 2018-02-06 NOTE — Telephone Encounter (Signed)
LMTCMB 02/06/18 re need to schedule MWV w [facility]

## 2018-02-21 ENCOUNTER — Ambulatory Visit (INDEPENDENT_AMBULATORY_CARE_PROVIDER_SITE_OTHER): Payer: Medicare Other | Admitting: Family Medicine

## 2018-02-21 ENCOUNTER — Encounter: Payer: Self-pay | Admitting: Family Medicine

## 2018-02-21 VITALS — BP 116/66 | HR 88 | Temp 98.0°F | Resp 16 | Ht 68.0 in | Wt 229.0 lb

## 2018-02-21 DIAGNOSIS — Z794 Long term (current) use of insulin: Secondary | ICD-10-CM

## 2018-02-21 DIAGNOSIS — Z23 Encounter for immunization: Secondary | ICD-10-CM

## 2018-02-21 DIAGNOSIS — E119 Type 2 diabetes mellitus without complications: Secondary | ICD-10-CM | POA: Diagnosis not present

## 2018-02-21 DIAGNOSIS — E7849 Other hyperlipidemia: Secondary | ICD-10-CM | POA: Diagnosis not present

## 2018-02-21 DIAGNOSIS — E039 Hypothyroidism, unspecified: Secondary | ICD-10-CM | POA: Diagnosis not present

## 2018-02-21 DIAGNOSIS — I10 Essential (primary) hypertension: Secondary | ICD-10-CM

## 2018-02-21 DIAGNOSIS — Z1159 Encounter for screening for other viral diseases: Secondary | ICD-10-CM

## 2018-02-21 LAB — POCT GLYCOSYLATED HEMOGLOBIN (HGB A1C)
Est. average glucose Bld gHb Est-mCnc: 183
Hemoglobin A1C: 8

## 2018-02-21 NOTE — Progress Notes (Signed)
Patient: Michael Lucas Male    DOB: Feb 28, 1946   72 y.o.   MRN: [id number] Visit Date: 02/21/2018  Today's Provider: Wilhemena Durie, MD   Chief Complaint  Patient presents with  . Diabetes  . Hypertension  . Hyperlipidemia   Subjective:    HPI  Diabetes Mellitus Type II, Follow-up:   Lab Results  Component Value Date   HGBA1C 8.0 02/21/2018   HGBA1C 7.8 (H) 07/19/2017   HGBA1C 8.4 03/09/2017   Last seen for diabetes 7 months ago.  Management since then includes no changes. He reports excellent compliance with treatment. He is not having side effects.  Current symptoms include paresthesia of the feet and have been stable. Home blood sugar records: fasting range: 170's  Episodes of hypoglycemia? no   Current Insulin Regimen: as indicated Most Recent Eye Exam:  Weight trend: stable Prior visit with dietician: no Current diet: in general, a "healthy" diet   Current exercise: no regular exercise  ------------------------------------------------------------------------   Hypertension, follow-up:  BP Readings from Last 3 Encounters:  02/21/18 116/66  08/26/17 132/73  07/19/17 114/60    He was last seen for hypertension 7 months ago.  BP at that visit was 114/60. Management since that visit includes labs checked.He reports excellent compliance with treatment. He is not having side effects.  He not exercising. He is adherent to low salt diet.   Outside blood pressures are stable. He is experiencing none.  Patient denies chest pain.   Cardiovascular risk factors include advanced age (older than 31 for men, 56 for women), diabetes mellitus, dyslipidemia, hypertension and obesity (BMI >= 30 kg/m2).  Use of agents associated with hypertension: none.   ------------------------------------------------------------------------    Lipid/Cholesterol, Follow-up:   Last seen for this 7 months ago.  Management since that visit includes check labs.  Last  Lipid Panel:    Component Value Date/Time   CHOL 115 07/19/2017 1543   TRIG 156 (H) 07/19/2017 1543   HDL 42 07/19/2017 1543   CHOLHDL 3.6 04/01/2016 1505   CHOLHDL 2.9 11/02/2011 0858   VLDL 27 11/02/2011 0858   LDLCALC 42 07/19/2017 1543    He reports excellent compliance with treatment. He is not having side effects.   Wt Readings from Last 3 Encounters:  02/21/18 229 lb (103.9 kg)  08/26/17 230 lb (104.3 kg)  07/19/17 230 lb (104.3 kg)   ------------------------------------------------------------------------   Hypothyroid, follow-up:  TSH  Date Value Ref Range Status  07/19/2017 5.070 (H) 0.450 - 4.500 uIU/mL Final  09/01/2016 4.840 (H) 0.450 - 4.500 uIU/mL Final  02/06/2015 4.610 (H) 0.450 - 4.500 uIU/mL Final   Wt Readings from Last 3 Encounters:  02/21/18 229 lb (103.9 kg)  08/26/17 230 lb (104.3 kg)  07/19/17 230 lb (104.3 kg)    He was last seen for hypothyroid 7 months ago.  Management since that visit includes check labs. He reports excellent compliance with treatment. He is not having side effects.  He is not exercising. He is experiencing none He denies change in energy level Weight trend: stable  ------------------------------------------------------------------------ CAD  PTSD      Allergies  Allergen Reactions  . No Known Allergies      Current Outpatient Medications:  .  atorvastatin (LIPITOR) 80 MG tablet, Take 80 mg by mouth every evening. , Disp: , Rfl:  .  cholecalciferol (VITAMIN D) 1000 units tablet, Take 1,000 Units by mouth daily., Disp: , Rfl:  .  docusate sodium (COLACE)  50 MG capsule, Take 150 mg by mouth daily., Disp: , Rfl:  .  Emollient (EUCERIN PLUS) 2.5-10 % CREA, Apply 1 application topically daily as needed (dry skin). , Disp: , Rfl:  .  feeding supplement, GLUCERNA SHAKE, (GLUCERNA SHAKE) LIQD, Take 237 mLs by mouth 2 (two) times daily., Disp: , Rfl:  .  furosemide (LASIX) 20 MG tablet, Take 20 mg by mouth daily.,  Disp: , Rfl:  .  insulin aspart (NOVOLOG) 100 UNIT/ML injection, Inject 18 Units into the skin 3 (three) times daily with meals. , Disp: , Rfl:  .  insulin glargine (LANTUS) 100 UNIT/ML injection, Inject 32 Units into the skin at bedtime. , Disp: , Rfl:  .  ketoconazole (NIZORAL) 2 % shampoo, Apply 1 application topically See admin instructions. Apply shampoo to head every other day, Disp: , Rfl:  .  levothyroxine (SYNTHROID, LEVOTHROID) 50 MCG tablet, Take 50 mcg by mouth daily before breakfast. , Disp: , Rfl:  .  loratadine (ALLERGY RELIEF) 10 MG tablet, Take 10 mg by mouth daily as needed for allergies., Disp: , Rfl:  .  methadone (DOLOPHINE) 10 MG tablet, Take 10 mg by mouth 2 (two) times daily. , Disp: , Rfl:  .  metoprolol (LOPRESSOR) 100 MG tablet, Take 1 tablet (100 mg total) by mouth 2 (two) times daily., Disp: 60 tablet, Rfl: 6 .  niacin (NIASPAN) 500 MG CR tablet, Take 2 tablets by mouth daily at 12 noon. , Disp: , Rfl:  .  prazosin (MINIPRESS) 2 MG capsule, Take 4 mg by mouth at bedtime. , Disp: , Rfl:  .  pregabalin (LYRICA) 75 MG capsule, Take 75 mg by mouth 2 (two) times daily., Disp: , Rfl:  .  QUEtiapine (SEROQUEL) 100 MG tablet, Take 50 mg by mouth at bedtime., Disp: , Rfl:  .  rivaroxaban (XARELTO) 20 MG TABS tablet, Take 1 tablet (20 mg total) by mouth daily with supper., Disp: 30 tablet, Rfl: 6 .  sertraline (ZOLOFT) 100 MG tablet, Take 50 mg by mouth daily., Disp: , Rfl:  .  tacrolimus (PROTOPIC) 0.03 % ointment, Apply 1 application topically every other day. Reported on 04/01/2016, Disp: , Rfl:   Review of Systems  Constitutional: Negative.   HENT: Negative.   Eyes: Negative.   Respiratory: Negative.   Cardiovascular: Negative.   Endocrine: Negative.   Musculoskeletal: Negative.   Allergic/Immunologic: Negative.   Neurological: Negative.   Hematological: Negative.   Psychiatric/Behavioral: Negative.     Social History   Tobacco Use  . Smoking status: Former  Smoker    Types: Cigarettes    Last attempt to quit: 1987    Years since quitting: 32.2  . Smokeless tobacco: Never Used  Substance Use Topics  . Alcohol use: No   Objective:   BP 116/66 (BP Location: Right Arm, Patient Position: Sitting, Cuff Size: Large)   Pulse 88   Temp 98 F (36.7 C) (Oral)   Resp 16   Ht 5\' 8"  (1.727 m)   Wt 229 lb (103.9 kg)   SpO2 96%   BMI 34.82 kg/m  Vitals:   02/21/18 1459  BP: 116/66  Pulse: 88  Resp: 16  Temp: 98 F (36.7 C)  TempSrc: Oral  SpO2: 96%  Weight: 229 lb (103.9 kg)  Height: 5\' 8"  (1.727 m)     Physical Exam  Constitutional: He is oriented to person, place, and time. He appears well-developed and well-nourished.  HENT:  Head: Normocephalic and atraumatic.  Eyes: Conjunctivae  are normal. No scleral icterus.  Neck: No thyromegaly present.  Cardiovascular: Normal rate, regular rhythm and normal heart sounds.  Pulmonary/Chest: Effort normal and breath sounds normal.  Abdominal: Soft.  Neurological: He is alert and oriented to person, place, and time.  Skin: Skin is warm and dry.  Psychiatric: He has a normal mood and affect. His behavior is normal. Judgment and thought content normal.        Assessment & Plan:     1. Type 2 diabetes mellitus treated with insulin (HCC) Stable. Patient advised to continue current medications and plan of care.  - POCT glycosylated hemoglobin (Hb A1C)--8.1  2. Essential hypertension, benign Stable. Patient advised to continue current medications and plan of care.  3. Other hyperlipidemia Stable. Patient advised to continue current medications and plan of care.  4. Need for pneumococcal vaccination Patient tolerated injection well. Patient observed for 15 minutes no adverse reactions noted. - Pneumococcal polysaccharide vaccine 23-valent greater than or equal to 2yo subcutaneous/IM  5. Adult hypothyroidism Stable. Follow up pending lab results. - TSH  6. Need for hepatitis C  screening test - Hepatitis C antibody 7.PTSD Chronic. 8.CAD/s/p CABG      I have done the exam and reviewed the above chart and it is accurate to the best of my knowledge. Development worker, community has been used in this note in any air is in the dictation or transcription are unintentional.  Wilhemena Durie, MD  [place]

## 2018-02-22 ENCOUNTER — Telehealth: Payer: Self-pay | Admitting: Family Medicine

## 2018-02-22 LAB — TSH: TSH: 4.37 u[IU]/mL (ref 0.450–4.500)

## 2018-02-22 LAB — HEPATITIS C ANTIBODY

## 2018-02-22 NOTE — Telephone Encounter (Signed)
LMOVM for pt to return call 

## 2018-02-22 NOTE — Telephone Encounter (Signed)
pt called returning Micheles call 02-22-18  Pt also states that he has has pain in his arm from his pneumonia injection   His call back Is [phone]   Thanks teri

## 2018-02-22 NOTE — Telephone Encounter (Addendum)
Patient was notified of results. Patient also stated he is having a lot of pain in his left arm from his pneumonia vaccine. Advised patient this is a common reaction to injections and it can take 2-3 days for soreness to go away. Also advised tylenol and ice compresses. Also advised to call back if symptoms worsen.

## 2018-02-24 ENCOUNTER — Telehealth: Payer: Self-pay

## 2018-02-24 NOTE — Telephone Encounter (Signed)
LMTCB and reschedule previously missed AWV. -MM 

## 2018-03-01 ENCOUNTER — Ambulatory Visit (INDEPENDENT_AMBULATORY_CARE_PROVIDER_SITE_OTHER): Payer: Medicare Other | Admitting: Family Medicine

## 2018-03-01 ENCOUNTER — Ambulatory Visit
Admission: RE | Admit: 2018-03-01 | Discharge: 2018-03-01 | Disposition: A | Discharge status: Home / Self Care | Payer: Medicare Other | Source: Ambulatory Visit | Attending: Family Medicine | Admitting: Family Medicine

## 2018-03-01 ENCOUNTER — Encounter: Payer: Self-pay | Admitting: Family Medicine

## 2018-03-01 ENCOUNTER — Ambulatory Visit: Payer: Medicare Other | Admitting: Family Medicine

## 2018-03-01 VITALS — BP 128/80 | HR 88 | Temp 98.3°F | Resp 16 | Wt 235.0 lb

## 2018-03-01 DIAGNOSIS — M19071 Primary osteoarthritis, right ankle and foot: Secondary | ICD-10-CM | POA: Insufficient documentation

## 2018-03-01 DIAGNOSIS — I2510 Atherosclerotic heart disease of native coronary artery without angina pectoris: Secondary | ICD-10-CM

## 2018-03-01 DIAGNOSIS — E119 Type 2 diabetes mellitus without complications: Secondary | ICD-10-CM

## 2018-03-01 DIAGNOSIS — M79671 Pain in right foot: Secondary | ICD-10-CM

## 2018-03-01 DIAGNOSIS — Z794 Long term (current) use of insulin: Secondary | ICD-10-CM | POA: Diagnosis not present

## 2018-03-01 NOTE — Progress Notes (Signed)
Patient: Michael Lucas Male    DOB: Dec 23, 1945   72 y.o.   MRN: [id number] Visit Date: 03/01/2018  Today's Provider: Wilhemena Durie, MD   Chief Complaint  Patient presents with  . Foot Pain    Right foot; started Monday (02/27/2018) afternoon.    Subjective:    Foot Pain  This is a new problem. The current episode started in the past 7 days. The problem occurs constantly. Associated symptoms include arthralgias. Pertinent negatives include no diaphoresis, fatigue, joint swelling, myalgias, neck pain, numbness, visual change or weakness. Nothing aggravates the symptoms. He has tried nothing for the symptoms.       Allergies  Allergen Reactions  . No Known Allergies      Current Outpatient Medications:  .  atorvastatin (LIPITOR) 80 MG tablet, Take 80 mg by mouth every evening. , Disp: , Rfl:  .  cholecalciferol (VITAMIN D) 1000 units tablet, Take 1,000 Units by mouth daily., Disp: , Rfl:  .  docusate sodium (COLACE) 50 MG capsule, Take 150 mg by mouth daily., Disp: , Rfl:  .  Emollient (EUCERIN PLUS) 2.5-10 % CREA, Apply 1 application topically daily as needed (dry skin). , Disp: , Rfl:  .  feeding supplement, GLUCERNA SHAKE, (GLUCERNA SHAKE) LIQD, Take 237 mLs by mouth 2 (two) times daily., Disp: , Rfl:  .  furosemide (LASIX) 20 MG tablet, Take 20 mg by mouth daily., Disp: , Rfl:  .  insulin aspart (NOVOLOG) 100 UNIT/ML injection, Inject 18 Units into the skin 3 (three) times daily with meals. , Disp: , Rfl:  .  insulin glargine (LANTUS) 100 UNIT/ML injection, Inject 32 Units into the skin at bedtime. , Disp: , Rfl:  .  ketoconazole (NIZORAL) 2 % shampoo, Apply 1 application topically See admin instructions. Apply shampoo to head every other day, Disp: , Rfl:  .  levothyroxine (SYNTHROID, LEVOTHROID) 50 MCG tablet, Take 50 mcg by mouth daily before breakfast. , Disp: , Rfl:  .  loratadine (ALLERGY RELIEF) 10 MG tablet, Take 10 mg by mouth daily as needed for allergies.,  Disp: , Rfl:  .  methadone (DOLOPHINE) 10 MG tablet, Take 10 mg by mouth 2 (two) times daily. , Disp: , Rfl:  .  metoprolol (LOPRESSOR) 100 MG tablet, Take 1 tablet (100 mg total) by mouth 2 (two) times daily., Disp: 60 tablet, Rfl: 6 .  niacin (NIASPAN) 500 MG CR tablet, Take 2 tablets by mouth daily at 12 noon. , Disp: , Rfl:  .  prazosin (MINIPRESS) 2 MG capsule, Take 4 mg by mouth at bedtime. , Disp: , Rfl:  .  pregabalin (LYRICA) 75 MG capsule, Take 75 mg by mouth 2 (two) times daily., Disp: , Rfl:  .  QUEtiapine (SEROQUEL) 100 MG tablet, Take 50 mg by mouth at bedtime., Disp: , Rfl:  .  rivaroxaban (XARELTO) 20 MG TABS tablet, Take 1 tablet (20 mg total) by mouth daily with supper., Disp: 30 tablet, Rfl: 6 .  sertraline (ZOLOFT) 100 MG tablet, Take 50 mg by mouth daily., Disp: , Rfl:  .  tacrolimus (PROTOPIC) 0.03 % ointment, Apply 1 application topically every other day. Reported on 04/01/2016, Disp: , Rfl:   Review of Systems  Constitutional: Negative.  Negative for diaphoresis and fatigue.  Respiratory: Negative.   Cardiovascular: Negative.   Endocrine: Negative.   Musculoskeletal: Positive for arthralgias and gait problem. Negative for back pain, joint swelling, myalgias, neck pain and neck stiffness.  Allergic/Immunologic:  Negative.   Neurological: Negative for weakness and numbness.  Psychiatric/Behavioral: The patient is nervous/anxious.     Social History   Tobacco Use  . Smoking status: Former Smoker    Types: Cigarettes    Last attempt to quit: 1987    Years since quitting: 32.2  . Smokeless tobacco: Never Used  Substance Use Topics  . Alcohol use: No   Objective:   BP 128/80 (BP Location: Right Arm, Patient Position: Sitting, Cuff Size: Large)   Pulse 88   Temp 98.3 F (36.8 C) (Oral)   Resp 16   Wt 235 lb (106.6 kg)   BMI 35.73 kg/m  Vitals:   03/01/18 1135  BP: 128/80  Pulse: 88  Resp: 16  Temp: 98.3 F (36.8 C)  TempSrc: Oral  Weight: 235 lb (106.6  kg)     Physical Exam  Constitutional: He is oriented to person, place, and time. He appears well-developed and well-nourished.  HENT:  Head: Normocephalic and atraumatic.  Eyes: Conjunctivae are normal. No scleral icterus.  Neck: No thyromegaly present.  Cardiovascular: Normal rate, regular rhythm and normal heart sounds.  Pulmonary/Chest: Effort normal and breath sounds normal.  Abdominal: Soft.  Musculoskeletal: He exhibits tenderness.  Mildly Tender over dorsal part of right foot.   Neurological: He is alert and oriented to person, place, and time. He has normal reflexes.  Skin: Skin is warm and dry.  Psychiatric: He has a normal mood and affect. His behavior is normal. Judgment and thought content normal.        Assessment & Plan:     1. Right foot pain May need podiatry referral. I think this will be self limiting, - DG Foot Complete Right; Future 2.PTSD 3.TIIDM 4.ASCVD     I have done the exam and reviewed the above chart and it is accurate to the best of my knowledge. Development worker, community has been used in this note in any air is in the dictation or transcription are unintentional.  Wilhemena Durie, MD  [place]

## 2018-03-02 ENCOUNTER — Telehealth: Payer: Self-pay

## 2018-03-02 NOTE — Telephone Encounter (Signed)
Left message to call back  

## 2018-03-02 NOTE — Telephone Encounter (Signed)
-----   Message from Jerrol Banana., MD sent at 03/02/2018  8:22 AM EDT ----- Arthritis present--no fracture.

## 2018-03-06 NOTE — Telephone Encounter (Signed)
LMTCB ED 

## 2018-03-09 NOTE — Telephone Encounter (Signed)
LMTCB ED 

## 2018-03-13 NOTE — Telephone Encounter (Signed)
Tried to call again. No answer. Sent letter.

## 2018-03-15 ENCOUNTER — Ambulatory Visit: Payer: Medicare Other | Admitting: Family Medicine

## 2018-03-22 ENCOUNTER — Ambulatory Visit: Payer: Medicare Other | Admitting: Family Medicine

## 2018-03-23 ENCOUNTER — Encounter: Payer: Self-pay | Admitting: Family Medicine

## 2018-03-23 ENCOUNTER — Ambulatory Visit
Admission: RE | Admit: 2018-03-23 | Discharge: 2018-03-23 | Disposition: A | Discharge status: Home / Self Care | Payer: Medicare Other | Source: Ambulatory Visit | Attending: Family Medicine | Admitting: Family Medicine

## 2018-03-23 ENCOUNTER — Ambulatory Visit (INDEPENDENT_AMBULATORY_CARE_PROVIDER_SITE_OTHER): Payer: Medicare Other | Admitting: Family Medicine

## 2018-03-23 VITALS — BP 128/60 | HR 92 | Temp 97.6°F | Resp 18 | Wt 235.0 lb

## 2018-03-23 DIAGNOSIS — M25512 Pain in left shoulder: Secondary | ICD-10-CM | POA: Insufficient documentation

## 2018-03-23 DIAGNOSIS — I2510 Atherosclerotic heart disease of native coronary artery without angina pectoris: Secondary | ICD-10-CM

## 2018-03-23 DIAGNOSIS — W19XXXA Unspecified fall, initial encounter: Secondary | ICD-10-CM

## 2018-03-23 DIAGNOSIS — S4992XA Unspecified injury of left shoulder and upper arm, initial encounter: Secondary | ICD-10-CM | POA: Diagnosis not present

## 2018-03-23 DIAGNOSIS — M19012 Primary osteoarthritis, left shoulder: Secondary | ICD-10-CM | POA: Diagnosis not present

## 2018-03-23 MED ORDER — PREDNISONE 10 MG (21) PO TBPK: ORAL_TABLET | ORAL | 0 refills | Status: DC

## 2018-03-23 NOTE — Progress Notes (Signed)
Patient: Michael Lucas Male    DOB: [birth year]   72 y.o.   MRN: [id number] Visit Date: 03/23/2018  Today's Provider: Wilhemena Durie, MD   Chief Complaint  Patient presents with  . Fall  . Shoulder Injury   Subjective:    HPI Pt is here today for shoulder pain. Pt reports that about a week ago he feel getting out of the bed about 4 am. He went to step on his left leg and it was numb and he fell on "all fours". He reports that since then his left leg has been swollen and his left shoulder has been hurting. He can not lift his arm without help from the other arm.  He also has a bruise on the inside of his left upper arm.     Allergies  Allergen Reactions  . No Known Allergies      Current Outpatient Medications:  .  atorvastatin (LIPITOR) 80 MG tablet, Take 80 mg by mouth every evening. , Disp: , Rfl:  .  cholecalciferol (VITAMIN D) 1000 units tablet, Take 1,000 Units by mouth daily., Disp: , Rfl:  .  docusate sodium (COLACE) 50 MG capsule, Take 150 mg by mouth daily., Disp: , Rfl:  .  Emollient (EUCERIN PLUS) 2.5-10 % CREA, Apply 1 application topically daily as needed (dry skin). , Disp: , Rfl:  .  insulin aspart (NOVOLOG) 100 UNIT/ML injection, Inject 18 Units into the skin 3 (three) times daily with meals. , Disp: , Rfl:  .  insulin glargine (LANTUS) 100 UNIT/ML injection, Inject 32 Units into the skin at bedtime. , Disp: , Rfl:  .  levothyroxine (SYNTHROID, LEVOTHROID) 50 MCG tablet, Take 50 mcg by mouth daily before breakfast. , Disp: , Rfl:  .  loratadine (ALLERGY RELIEF) 10 MG tablet, Take 10 mg by mouth daily as needed for allergies., Disp: , Rfl:  .  methadone (DOLOPHINE) 10 MG tablet, Take 10 mg by mouth 2 (two) times daily. , Disp: , Rfl:  .  metoprolol (LOPRESSOR) 100 MG tablet, Take 1 tablet (100 mg total) by mouth 2 (two) times daily., Disp: 60 tablet, Rfl: 6 .  niacin (NIASPAN) 500 MG CR tablet, Take 2 tablets by mouth daily at 12 noon. , Disp: , Rfl:  .   prazosin (MINIPRESS) 2 MG capsule, Take 4 mg by mouth at bedtime. , Disp: , Rfl:  .  pregabalin (LYRICA) 75 MG capsule, Take 75 mg by mouth 2 (two) times daily., Disp: , Rfl:  .  QUEtiapine (SEROQUEL) 100 MG tablet, Take 50 mg by mouth at bedtime., Disp: , Rfl:  .  rivaroxaban (XARELTO) 20 MG TABS tablet, Take 1 tablet (20 mg total) by mouth daily with supper., Disp: 30 tablet, Rfl: 6 .  sertraline (ZOLOFT) 100 MG tablet, Take 50 mg by mouth daily., Disp: , Rfl:  .  tacrolimus (PROTOPIC) 0.03 % ointment, Apply 1 application topically every other day. Reported on 04/01/2016, Disp: , Rfl:  .  feeding supplement, GLUCERNA SHAKE, (GLUCERNA SHAKE) LIQD, Take 237 mLs by mouth 2 (two) times daily., Disp: , Rfl:  .  furosemide (LASIX) 20 MG tablet, Take 20 mg by mouth daily., Disp: , Rfl:  .  ketoconazole (NIZORAL) 2 % shampoo, Apply 1 application topically See admin instructions. Apply shampoo to head every other day, Disp: , Rfl:   Review of Systems  Constitutional: Negative.   HENT: Negative.   Eyes: Negative.   Respiratory: Negative.  Cardiovascular: Positive for leg swelling.  Gastrointestinal: Negative.   Endocrine: Negative.   Genitourinary: Negative.   Musculoskeletal: Positive for arthralgias.  Skin: Negative.   Allergic/Immunologic: Negative.   Neurological: Negative.   Hematological: Negative.   Psychiatric/Behavioral: Negative.     Social History   Tobacco Use  . Smoking status: Former Smoker    Types: Cigarettes    Last attempt to quit: 1987    Years since quitting: 32.3  . Smokeless tobacco: Never Used  Substance Use Topics  . Alcohol use: No   Objective:   BP 128/60 (BP Location: Right Arm, Patient Position: Sitting, Cuff Size: Large)   Pulse 92   Temp 97.6 F (36.4 C) (Oral)   Resp 18   Wt 235 lb (106.6 kg)   SpO2 97%   BMI 35.73 kg/m  Vitals:   03/23/18 1352  BP: 128/60  Pulse: 92  Resp: 18  Temp: 97.6 F (36.4 C)  TempSrc: Oral  SpO2: 97%  Weight: 235  lb (106.6 kg)     Physical Exam  Constitutional: He is oriented to person, place, and time. He appears well-developed and well-nourished.  HENT:  Head: Normocephalic and atraumatic.  Eyes: No scleral icterus.  Neck: No thyromegaly present.  Cardiovascular: Normal rate, regular rhythm and normal heart sounds.  Pulmonary/Chest: Effort normal and breath sounds normal.  Abdominal: Soft.  Musculoskeletal: He exhibits no edema.  Mild discomfort with movement of left shoulde No other injuries.r.  Neurological: He is alert and oriented to person, place, and time.  Skin: Skin is warm and dry.  Psychiatric: He has a normal mood and affect. His behavior is normal. Judgment and thought content normal.        Assessment & Plan:     1. Fall, initial encounter  - DG Shoulder Left; Future - predniSONE (STERAPRED UNI-PAK 21 TAB) 10 MG (21) TBPK tablet; Take 6 tablets on the 1st day then decrease by one daily until finished.  Dispense: 21 tablet; Refill: 0  2. Acute pain of left shoulder Shoulder arthropathy--may need referral. - DG Shoulder Left; Future - predniSONE (STERAPRED UNI-PAK 21 TAB) 10 MG (21) TBPK tablet; Take 6 tablets on the 1st day then decrease by one daily until finished.  Dispense: 21 tablet; Refill: 0 3.PTSD  I have done the exam and reviewed the chart and it is accurate to the best of my knowledge. Development worker, community has been used and  any errors in dictation or transcription are unintentional. Miguel Aschoff M.D. [place], MD  [facility]

## 2018-03-27 ENCOUNTER — Telehealth: Payer: Self-pay

## 2018-03-27 NOTE — Telephone Encounter (Signed)
-----   Message from Jerrol Banana., MD sent at 03/24/2018 11:46 AM EDT ----- Arthritic changes.

## 2018-03-27 NOTE — Telephone Encounter (Signed)
LMTCB 03/27/2018  Thanks,   -Mickel Baas

## 2018-03-28 NOTE — Telephone Encounter (Signed)
LMTCB

## 2018-04-03 NOTE — Telephone Encounter (Signed)
LMTCB ED 

## 2018-04-19 NOTE — Telephone Encounter (Signed)
Pt returned call ° °teri °

## 2018-05-29 ENCOUNTER — Ambulatory Visit
Admission: RE | Admit: 2018-05-29 | Discharge: 2018-05-29 | Disposition: A | Discharge status: Home / Self Care | Payer: Medicare Other | Source: Ambulatory Visit | Attending: Family Medicine | Admitting: Family Medicine

## 2018-05-29 ENCOUNTER — Telehealth: Payer: Self-pay | Admitting: Family Medicine

## 2018-05-29 ENCOUNTER — Other Ambulatory Visit: Payer: Self-pay | Admitting: Family Medicine

## 2018-05-29 ENCOUNTER — Ambulatory Visit (INDEPENDENT_AMBULATORY_CARE_PROVIDER_SITE_OTHER): Payer: Medicare Other | Admitting: Family Medicine

## 2018-05-29 VITALS — BP 150/74 | HR 106 | Temp 98.2°F | Resp 18 | Wt 227.0 lb

## 2018-05-29 DIAGNOSIS — I82492 Acute embolism and thrombosis of other specified deep vein of left lower extremity: Secondary | ICD-10-CM

## 2018-05-29 DIAGNOSIS — R600 Localized edema: Secondary | ICD-10-CM | POA: Diagnosis not present

## 2018-05-29 DIAGNOSIS — M1712 Unilateral primary osteoarthritis, left knee: Secondary | ICD-10-CM | POA: Diagnosis not present

## 2018-05-29 DIAGNOSIS — I25810 Atherosclerosis of coronary artery bypass graft(s) without angina pectoris: Secondary | ICD-10-CM | POA: Diagnosis not present

## 2018-05-29 DIAGNOSIS — F4312 Post-traumatic stress disorder, chronic: Secondary | ICD-10-CM

## 2018-05-29 DIAGNOSIS — M7989 Other specified soft tissue disorders: Secondary | ICD-10-CM | POA: Insufficient documentation

## 2018-05-29 NOTE — Progress Notes (Signed)
Aadam Zhen  MRN: [id number] DOB: 1946/11/01  Subjective:  HPI   The patient is a 72 year old male who presents for evaluaion on left leg swelling.  The patient had a nurse practitioner from the [place] tell him that she needed him to have his leg checked.  She stated that it was swollen, red, tight and he had sluggish capillary refill.  The foot was cool and discolored.  She was unable to palpate a pulse.  She recommended at that time that he go tot he ER and have further evaluation with possibly an MRI.  Patient declined on that day which was 05/25/18.  He decided he wanted the opinion of his PCP before having the testing done.  Patient Active Problem List   Diagnosis Date Noted  . Tendinitis of right wrist 01/25/2017  . Hypertensive heart disease   . Chronic diastolic CHF (congestive heart failure) ([place])   . Paroxysmal atrial fibrillation (HCC)   . CKD (chronic kidney disease), stage III ([place])   . Coronary artery disease   . Absolute anemia 04/03/2015  . Arthritis 04/03/2015  . Chronic pain associated with significant psychosocial dysfunction 04/03/2015  . Constipation 04/03/2015  . Coronary artery abnormality 04/03/2015  . Type 2 diabetes mellitus treated with insulin ([place]) 04/03/2015  . Essential (primary) hypertension 04/03/2015  . Broken fibula 04/03/2015  . Acid reflux 04/03/2015  . Adult hypothyroidism 04/03/2015  . Acute kidney failure ([place]) 04/03/2015  . Calculus of kidney 04/03/2015  . Neuropathy 04/03/2015  . Arthritis, degenerative 04/03/2015  . 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 AF (paroxysmal atrial fibrillation) ([place]) 04/03/2015  . Decreased potassium in the blood 04/03/2015  . Neurosis, posttraumatic 04/03/2015  . Contusion of chest wall 04/03/2015  . Dermatitis seborrheica 04/03/2015  . Episode of syncope 04/03/2015  . Malaise 04/03/2015  . Clinical depression 04/03/2015  . Bilateral carotid artery stenosis 02/05/2014  . Orthostatic hypotension 06/02/2012  .  Carotid bruit 11/05/2011  . Hyperlipidemia 04/10/2011  . UNSTEADY GAIT 06/24/2010  . DIZZINESS 01/21/2010  . ATRIAL FIBRILLATION 09/08/2009  . OCCLUSION&STENOS CAROTID ART W/O MENTION INFARCT 09/08/2009  . DIASTOLIC HEART FAILURE, CHRONIC 08/12/2009  . EDEMA 08/12/2009  . Essential hypertension, benign 06/16/2009  . CAD, ARTERY BYPASS GRAFT 06/16/2009  . ATRIAL FLUTTER 06/16/2009  . LEG PAIN, BILATERAL 06/16/2009    Past Medical History:  Diagnosis Date  . Anemia   . Ankle fracture, left   . Anxiety   . Arthritis   . Atrial flutter ([place])    a. 12/2008 s/p RFCA. Holter monitor 9/20 showed predominantly NSR with some short runs of atrial fibrillation. Coumadin stopped 7/11 due to frequent falls.  . Carotid arterial disease ([place])    a. 11/2011 U/S: 0-39% bilat ICA stenosis;  b. 03/2013 Carotid U/S: 0-39% bilat ICA stenosis.  . Cervical disc disease   . Chronic back pain   . Chronic diastolic CHF (congestive heart failure) ([place])    a. 07/2009 Echo: EF 50-55%, mild LVH, grade I DD, mild MR; b. 04/2015 Echo: Ef 65-70%, triv AI.  Marland Kitchen Chronic neck pain   . CKD (chronic kidney disease), stage III ([place])    stage III  . Coronary artery disease    a. 1995 s/p CABG x 1 (LIMA->LAD);  b. 04/2001 Cath: LAD 100, patent LIMA->LAD, otw nonobs LCX/RCA dzs.  . Depression   . Dermatitis   . Diabetes mellitus   . Dyspnea   . Dysrhythmia   . Edema    FEET/LEGS  .  Fractured fibula   . Heart murmur   . Hypertensive heart disease   . Hypothyroidism   . Nephrolithiasis    h/o  . Orthopnea    2-3 PILLOWS  . OSA (obstructive sleep apnea)    a. Unable to tolerate CPAP because of allergic reaction to straps of face mask.  . Paroxysmal atrial fibrillation ([place])    a. 07/2009 PAF noted on holter; b. 05/2010 Coumadin d/c'd 2/2 falls; c. 02/2015 CHA2DS2VASc = 5-->Xarelto.  . Peripheral neuropathy    suspect diabetes related. has led to gait instability. seen by [facility], head MRI showed only mild  diffuse atrophy with moderate peri-sylvan atrophy.  Marland Kitchen PTSD (post-traumatic stress disorder)    takes prazosin for this.  . Pulmonary embolism ([place]) 2010  . Stroke [facility])    POSSIBLE TIA  . Wears dentures    full upper and lower (only wears upper)    Social History   Socioeconomic History  . Marital status: Widowed    Spouse name: Not on file  . Number of children: Not on file  . Years of education: Not on file  . Highest education level: Not on file  Occupational History  . Occupation: Disabled    Employer: SELF EMPLOYED  Social Needs  . Financial resource strain: Not on file  . Food insecurity:    Worry: Not on file    Inability: Not on file  . Transportation needs:    Medical: Not on file    Non-medical: Not on file  Tobacco Use  . Smoking status: Former Smoker    Types: Cigarettes    Last attempt to quit: 1987    Years since quitting: 32.5  . Smokeless tobacco: Never Used  Substance and Sexual Activity  . Alcohol use: No  . Drug use: No  . Sexual activity: Not on file  Lifestyle  . Physical activity:    Days per week: Not on file    Minutes per session: Not on file  . Stress: Not on file  Relationships  . Social connections:    Talks on phone: Not on file    Gets together: Not on file    Attends religious service: Not on file    Active member of club or organization: Not on file    Attends meetings of clubs or organizations: Not on file    Relationship status: Not on file  . Intimate partner violence:    Fear of current or ex partner: Not on file    Emotionally abused: Not on file    Physically abused: Not on file    Forced sexual activity: Not on file  Other Topics Concern  . Not on file  Social History Narrative   Lives in [place].    Outpatient Encounter Medications as of 05/29/2018  Medication Sig Note  . atorvastatin (LIPITOR) 80 MG tablet Take 80 mg by mouth every evening.    . cholecalciferol (VITAMIN D) 1000 units tablet Take 1,000 Units by  mouth daily.   Marland Kitchen docusate sodium (COLACE) 50 MG capsule Take 150 mg by mouth daily.   . Emollient (EUCERIN PLUS) 2.5-10 % CREA Apply 1 application topically daily as needed (dry skin).    . feeding supplement, GLUCERNA SHAKE, (GLUCERNA SHAKE) LIQD Take 237 mLs by mouth 2 (two) times daily.   . furosemide (LASIX) 20 MG tablet Take 20 mg by mouth daily. 08/26/2017: 2 months   . insulin aspart (NOVOLOG) 100 UNIT/ML injection Inject 18 Units into the  skin 3 (three) times daily with meals.    . insulin glargine (LANTUS) 100 UNIT/ML injection Inject 32 Units into the skin at bedtime.  08/26/2017: 16 units   . ketoconazole (NIZORAL) 2 % shampoo Apply 1 application topically See admin instructions. Apply shampoo to head every other day   . levothyroxine (SYNTHROID, LEVOTHROID) 50 MCG tablet Take 50 mcg by mouth daily before breakfast.    . loratadine (ALLERGY RELIEF) 10 MG tablet Take 10 mg by mouth daily as needed for allergies.   . methadone (DOLOPHINE) 10 MG tablet Take 10 mg by mouth 2 (two) times daily.    . metoprolol (LOPRESSOR) 100 MG tablet Take 1 tablet (100 mg total) by mouth 2 (two) times daily.   . niacin (NIASPAN) 500 MG CR tablet Take 2 tablets by mouth daily at 12 noon.    . prazosin (MINIPRESS) 2 MG capsule Take 4 mg by mouth at bedtime.    . pregabalin (LYRICA) 75 MG capsule Take 75 mg by mouth 2 (two) times daily.   . QUEtiapine (SEROQUEL) 100 MG tablet Take 50 mg by mouth at bedtime.   . rivaroxaban (XARELTO) 20 MG TABS tablet Take 1 tablet (20 mg total) by mouth daily with supper.   . sertraline (ZOLOFT) 100 MG tablet Take 50 mg by mouth daily.   . tacrolimus (PROTOPIC) 0.03 % ointment Apply 1 application topically every other day. Reported on 04/01/2016   . [DISCONTINUED] predniSONE (STERAPRED UNI-PAK 21 TAB) 10 MG (21) TBPK tablet Take 6 tablets on the 1st day then decrease by one daily until finished.    No facility-administered encounter medications on file as of 05/29/2018.      Allergies  Allergen Reactions  . No Known Allergies     Review of Systems  Constitutional: Negative for fever and malaise/fatigue.  HENT: Negative.   Eyes: Negative.   Respiratory: Negative for cough, shortness of breath and wheezing.   Cardiovascular: Positive for leg swelling. Negative for chest pain, palpitations, orthopnea and claudication.  Gastrointestinal: Negative.   Genitourinary: Negative.   Musculoskeletal: Positive for joint pain.       Left knee pain.  Skin: Negative.   Endo/Heme/Allergies: Negative.   Psychiatric/Behavioral: The patient is nervous/anxious.     Objective:  BP (!) 150/74 (BP Location: Right Arm, Patient Position: Sitting, Cuff Size: Normal)   Pulse (!) 106   Temp 98.2 F (36.8 C) (Oral)   Resp 18   Wt 227 lb (103 kg)   BMI 34.52 kg/m   Physical Exam  Constitutional: He is oriented to person, place, and time and well-developed, well-nourished, and in no distress.  HENT:  Head: Normocephalic and atraumatic.  Eyes: Conjunctivae are normal. No scleral icterus.  Neck: No thyromegaly present.  Cardiovascular: Normal rate, regular rhythm and normal heart sounds.  Pulmonary/Chest: Breath sounds normal.  Abdominal: Soft.  Musculoskeletal:  Rt calg 15 inches  Lt calf 15.5 inches. Possible cord? Negative Homans.  Neurological: He is alert and oriented to person, place, and time. Gait normal. GCS score is 15.  Skin: Skin is warm and dry.  Psychiatric: Mood, memory, affect and judgment normal.    Assessment and Plan :  1. Acute deep vein thrombosis (DVT) of other specified vein of left lower extremity (HCC)/r/o DVT 2.Leg swelling 3.Knee OA 4.PTSD 5.CAD 6.TIIDM  I have done the exam and reviewed the chart and it is accurate to the best of my knowledge. Development worker, community has been used and  any errors  in dictation or transcription are unintentional. Miguel Aschoff M.D. [facility]

## 2018-05-29 NOTE — Telephone Encounter (Signed)
Patient advised that there was no evidence of DVT.

## 2018-05-29 NOTE — Telephone Encounter (Signed)
Pt called wanting to know if we have received results from his [place] he had this afternoon  Con Memos

## 2018-08-24 ENCOUNTER — Ambulatory Visit (INDEPENDENT_AMBULATORY_CARE_PROVIDER_SITE_OTHER): Payer: Medicare Other | Admitting: Family Medicine

## 2018-08-24 ENCOUNTER — Encounter: Payer: Self-pay | Admitting: Family Medicine

## 2018-08-24 VITALS — BP 102/80 | HR 95 | Temp 97.9°F | Resp 16 | Wt 225.0 lb

## 2018-08-24 DIAGNOSIS — Z794 Long term (current) use of insulin: Secondary | ICD-10-CM | POA: Diagnosis not present

## 2018-08-24 DIAGNOSIS — I25810 Atherosclerosis of coronary artery bypass graft(s) without angina pectoris: Secondary | ICD-10-CM

## 2018-08-24 DIAGNOSIS — N183 Chronic kidney disease, stage 3 unspecified: Secondary | ICD-10-CM

## 2018-08-24 DIAGNOSIS — L300 Nummular dermatitis: Secondary | ICD-10-CM | POA: Diagnosis not present

## 2018-08-24 DIAGNOSIS — E039 Hypothyroidism, unspecified: Secondary | ICD-10-CM

## 2018-08-24 DIAGNOSIS — F4312 Post-traumatic stress disorder, chronic: Secondary | ICD-10-CM

## 2018-08-24 DIAGNOSIS — F3289 Other specified depressive episodes: Secondary | ICD-10-CM | POA: Diagnosis not present

## 2018-08-24 DIAGNOSIS — I10 Essential (primary) hypertension: Secondary | ICD-10-CM

## 2018-08-24 DIAGNOSIS — E119 Type 2 diabetes mellitus without complications: Secondary | ICD-10-CM

## 2018-08-24 DIAGNOSIS — E7849 Other hyperlipidemia: Secondary | ICD-10-CM

## 2018-08-24 DIAGNOSIS — Z23 Encounter for immunization: Secondary | ICD-10-CM

## 2018-08-24 DIAGNOSIS — I4891 Unspecified atrial fibrillation: Secondary | ICD-10-CM | POA: Diagnosis not present

## 2018-08-24 LAB — POCT GLYCOSYLATED HEMOGLOBIN (HGB A1C): HEMOGLOBIN A1C: 7.8 % — AB (ref 4.0–5.6)

## 2018-08-24 MED ORDER — MOMETASONE FUROATE 0.1 % EX CREA: 1.0000 "application " | TOPICAL_CREAM | Freq: Every day | CUTANEOUS | 0 refills | Status: DC

## 2018-08-24 NOTE — Progress Notes (Signed)
Patient: Michael Lucas Male    DOB: October 28, 1946   72 y.o.   MRN: [id number] Visit Date: 08/24/2018  Today's Provider: Wilhemena Durie, MD   Chief Complaint  Patient presents with  . Diabetes  . Hypertension  . Hypothyroidism  . Hyperlipidemia   Subjective:    HPI      Diabetes Mellitus Type II, Follow-up:   Lab Results  Component Value Date   HGBA1C 8.0 02/21/2018   HGBA1C 7.8 (H) 07/19/2017   HGBA1C 8.4 03/09/2017   Last seen for diabetes 6 months ago.  Management since then includes . He reports excellent compliance with treatment. He is not having side effects.  Current symptoms include polydipsia and have been unchanged. Home blood sugar records: fasting range: 100-200  Episodes of hypoglycemia? no   Current Insulin Regimen: Lantus 32U qhs and Novolog 18U fTID Most Recent Eye Exam: 1year Weight trend: decreasing steadily Prior visit with dietician: yes -  Current diet: in general, a "healthy" diet   Current exercise: no regular exercise  ------------------------------------------------------------------------   Hypertension, follow-up:  BP Readings from Last 3 Encounters:  08/24/18 102/80  05/29/18 (!) 150/74  03/23/18 128/60    He was last seen for hypertension 6 months ago.  BP at that visit was 116/66. Management since that visit includes .He reports excellent compliance with treatment. He is not having side effects.  He is not exercising. He is adherent to low salt diet.   Outside blood pressures are 130/70. He is experiencing none.  Patient denies none.   Cardiovascular risk factors include advanced age (older than 1 for men, 75 for women), diabetes mellitus, hypertension, male gender and obesity (BMI >= 30 kg/m2).  Use of agents associated with hypertension: none.   ------------------------------------------------------------------------    Lipid/Cholesterol, Follow-up:   Last seen for this 6 months ago.  Management since  that visit includes none.  Last Lipid Panel:    Component Value Date/Time   CHOL 115 07/19/2017 1543   TRIG 156 (H) 07/19/2017 1543   HDL 42 07/19/2017 1543   CHOLHDL 3.6 04/01/2016 1505   CHOLHDL 2.9 11/02/2011 0858   VLDL 27 11/02/2011 0858   LDLCALC 42 07/19/2017 1543    He reports excellent compliance with treatment. He is not having side effects.   Wt Readings from Last 3 Encounters:  08/24/18 225 lb (102.1 kg)  05/29/18 227 lb (103 kg)  03/23/18 235 lb (106.6 kg)    ------------------------------------------------------------------------   Hypothyroid, follow-up:  TSH  Date Value Ref Range Status  02/21/2018 4.370 0.450 - 4.500 uIU/mL Final  07/19/2017 5.070 (H) 0.450 - 4.500 uIU/mL Final  09/01/2016 4.840 (H) 0.450 - 4.500 uIU/mL Final   Wt Readings from Last 3 Encounters:  08/24/18 225 lb (102.1 kg)  05/29/18 227 lb (103 kg)  03/23/18 235 lb (106.6 kg)    He was last seen for hypothyroid 6 months ago.  Management since that visit includes . He reports excellent compliance with treatment. He is not having side effects.  He is not exercising. He is experiencing none He denies change in energy level, diarrhea, heat / cold intolerance, nervousness, palpitations and weight changes Weight trend: decreasing steadily  ------------------------------------------------------------------------  Allergies  Allergen Reactions  . No Known Allergies      Current Outpatient Medications:  .  atorvastatin (LIPITOR) 80 MG tablet, Take 80 mg by mouth every evening. , Disp: , Rfl:  .  cholecalciferol (VITAMIN D) 1000 units tablet, Take  1,000 Units by mouth daily., Disp: , Rfl:  .  docusate sodium (COLACE) 50 MG capsule, Take 150 mg by mouth daily., Disp: , Rfl:  .  Emollient (EUCERIN PLUS) 2.5-10 % CREA, Apply 1 application topically daily as needed (dry skin). , Disp: , Rfl:  .  feeding supplement, GLUCERNA SHAKE, (GLUCERNA SHAKE) LIQD, Take 237 mLs by mouth 2 (two)  times daily., Disp: , Rfl:  .  furosemide (LASIX) 20 MG tablet, Take 20 mg by mouth daily., Disp: , Rfl:  .  insulin aspart (NOVOLOG) 100 UNIT/ML injection, Inject 18 Units into the skin 3 (three) times daily with meals. , Disp: , Rfl:  .  insulin glargine (LANTUS) 100 UNIT/ML injection, Inject 32 Units into the skin at bedtime. , Disp: , Rfl:  .  ketoconazole (NIZORAL) 2 % shampoo, Apply 1 application topically See admin instructions. Apply shampoo to head every other day, Disp: , Rfl:  .  levothyroxine (SYNTHROID, LEVOTHROID) 50 MCG tablet, Take 50 mcg by mouth daily before breakfast. , Disp: , Rfl:  .  loratadine (ALLERGY RELIEF) 10 MG tablet, Take 10 mg by mouth daily as needed for allergies., Disp: , Rfl:  .  methadone (DOLOPHINE) 10 MG tablet, Take 10 mg by mouth 2 (two) times daily. , Disp: , Rfl:  .  metoprolol (LOPRESSOR) 100 MG tablet, Take 1 tablet (100 mg total) by mouth 2 (two) times daily., Disp: 60 tablet, Rfl: 6 .  niacin (NIASPAN) 500 MG CR tablet, Take 2 tablets by mouth daily at 12 noon. , Disp: , Rfl:  .  prazosin (MINIPRESS) 2 MG capsule, Take 4 mg by mouth at bedtime. , Disp: , Rfl:  .  pregabalin (LYRICA) 75 MG capsule, Take 75 mg by mouth 2 (two) times daily., Disp: , Rfl:  .  QUEtiapine (SEROQUEL) 100 MG tablet, Take 50 mg by mouth at bedtime., Disp: , Rfl:  .  rivaroxaban (XARELTO) 20 MG TABS tablet, Take 1 tablet (20 mg total) by mouth daily with supper., Disp: 30 tablet, Rfl: 6 .  sertraline (ZOLOFT) 100 MG tablet, Take 50 mg by mouth daily., Disp: , Rfl:  .  tacrolimus (PROTOPIC) 0.03 % ointment, Apply 1 application topically every other day. Reported on 04/01/2016, Disp: , Rfl:   Review of Systems  Constitutional: Negative.   HENT: Negative.   Eyes: Negative.   Respiratory: Negative.   Cardiovascular: Negative.   Gastrointestinal: Negative.   Endocrine: Negative.   Genitourinary: Negative.   Allergic/Immunologic: Negative.   Neurological: Positive for numbness.    Psychiatric/Behavioral: Positive for dysphoric mood. The patient is nervous/anxious.     Social History   Tobacco Use  . Smoking status: Former Smoker    Types: Cigarettes    Last attempt to quit: 1987    Years since quitting: 32.7  . Smokeless tobacco: Never Used  Substance Use Topics  . Alcohol use: No   Objective:   BP 102/80   Pulse 95   Temp 97.9 F (36.6 C) (Oral)   Resp 16   Wt 225 lb (102.1 kg)   SpO2 94%   BMI 34.21 kg/m  Vitals:   08/24/18 1409  BP: 102/80  Pulse: 95  Resp: 16  Temp: 97.9 F (36.6 C)  TempSrc: Oral  SpO2: 94%  Weight: 225 lb (102.1 kg)     Physical Exam  Constitutional: He is oriented to person, place, and time. He appears well-developed and well-nourished.  HENT:  Head: Normocephalic and atraumatic.  Right Ear:  External ear normal.  Left Ear: External ear normal.  Nose: Nose normal.  Eyes: Conjunctivae are normal. No scleral icterus.  Neck: No thyromegaly present.  Cardiovascular: Normal rate, regular rhythm and normal heart sounds.  Pulmonary/Chest: Effort normal and breath sounds normal.  Abdominal: Soft.  Musculoskeletal: He exhibits edema.  Trace edema.  Lymphadenopathy:    He has no cervical adenopathy.  Neurological: He is alert and oriented to person, place, and time.  Cannot feel monofilament foot exam.  Skin: Skin is warm and dry.  Psychiatric: He has a normal mood and affect. His behavior is normal. Judgment and thought content normal.        Assessment & Plan:     1. Type 2 diabetes mellitus treated with insulin (HCC) Stable. - POCT glycosylated hemoglobin (Hb A1C)--7.8 today  2. Essential hypertension, benign  - TSH - Comprehensive metabolic panel - CBC with Differential/Platelet  3. Adult hypothyroidism  - TSH  4. Other hyperlipidemia  - Lipid panel  5. Need for influenza vaccination  - Flu vaccine HIGH DOSE PF  6. Nummular eczema  - mometasone (ELOCON) 0.1 % cream; Apply 1 application  topically daily.  Dispense: 45 g; Refill: 0  7. Atrial fibrillation, unspecified type ([place])   8. CKD (chronic kidney disease), stage III ([place])   9. Other depression   10. Chronic post-traumatic stress disorder (PTSD)        Wilhemena Durie, MD  [facility]

## 2018-09-26 ENCOUNTER — Telehealth: Payer: Self-pay | Admitting: Family Medicine

## 2018-09-26 NOTE — Telephone Encounter (Signed)
I called the patient to schedule AWV with [facility].  There was no answer and no option to leave a message because the voicemail was full. VDM (DD)

## 2018-10-05 DIAGNOSIS — E7849 Other hyperlipidemia: Secondary | ICD-10-CM | POA: Diagnosis not present

## 2018-10-05 DIAGNOSIS — I10 Essential (primary) hypertension: Secondary | ICD-10-CM | POA: Diagnosis not present

## 2018-10-05 DIAGNOSIS — E039 Hypothyroidism, unspecified: Secondary | ICD-10-CM | POA: Diagnosis not present

## 2018-10-06 LAB — CBC WITH DIFFERENTIAL/PLATELET
BASOS ABS: 0.1 10*3/uL (ref 0.0–0.2)
Basos: 1 %
EOS (ABSOLUTE): 0.7 10*3/uL — AB (ref 0.0–0.4)
Eos: 7 %
Hematocrit: 35.5 % — ABNORMAL LOW (ref 37.5–51.0)
Hemoglobin: 11.4 g/dL — ABNORMAL LOW (ref 13.0–17.7)
IMMATURE GRANS (ABS): 0.1 10*3/uL (ref 0.0–0.1)
IMMATURE GRANULOCYTES: 1 %
LYMPHS: 30 %
Lymphocytes Absolute: 2.9 10*3/uL (ref 0.7–3.1)
MCH: 28.5 pg (ref 26.6–33.0)
MCHC: 32.1 g/dL (ref 31.5–35.7)
MCV: 89 fL (ref 79–97)
Monocytes Absolute: 0.7 10*3/uL (ref 0.1–0.9)
Monocytes: 8 %
NEUTROS PCT: 53 %
Neutrophils Absolute: 5.1 10*3/uL (ref 1.4–7.0)
PLATELETS: 169 10*3/uL (ref 150–450)
RBC: 4 x10E6/uL — AB (ref 4.14–5.80)
RDW: 13.1 % (ref 12.3–15.4)
WBC: 9.5 10*3/uL (ref 3.4–10.8)

## 2018-10-06 LAB — COMPREHENSIVE METABOLIC PANEL
A/G RATIO: 1.7 (ref 1.2–2.2)
ALBUMIN: 4.3 g/dL (ref 3.5–4.8)
ALK PHOS: 93 IU/L (ref 39–117)
ALT: 6 IU/L (ref 0–44)
AST: 12 IU/L (ref 0–40)
BUN/Creatinine Ratio: 13 (ref 10–24)
BUN: 17 mg/dL (ref 8–27)
Bilirubin Total: 0.4 mg/dL (ref 0.0–1.2)
CALCIUM: 9.2 mg/dL (ref 8.6–10.2)
CHLORIDE: 103 mmol/L (ref 96–106)
CO2: 21 mmol/L (ref 20–29)
Creatinine, Ser: 1.36 mg/dL — ABNORMAL HIGH (ref 0.76–1.27)
GFR calc Af Amer: 59 mL/min/{1.73_m2} — ABNORMAL LOW (ref >59)
GFR, EST NON AFRICAN AMERICAN: 51 mL/min/{1.73_m2} — AB (ref >59)
GLOBULIN, TOTAL: 2.6 g/dL (ref 1.5–4.5)
Glucose: 210 mg/dL — ABNORMAL HIGH (ref 65–99)
Potassium: 4.3 mmol/L (ref 3.5–5.2)
Sodium: 141 mmol/L (ref 134–144)
Total Protein: 6.9 g/dL (ref 6.0–8.5)

## 2018-10-06 LAB — TSH: TSH: 5.98 u[IU]/mL — ABNORMAL HIGH (ref 0.450–4.500)

## 2018-10-06 LAB — LIPID PANEL
CHOLESTEROL TOTAL: 111 mg/dL (ref 100–199)
Chol/HDL Ratio: 2.7 ratio (ref 0.0–5.0)
HDL: 41 mg/dL (ref >39)
LDL Calculated: 43 mg/dL (ref 0–99)
Triglycerides: 137 mg/dL (ref 0–149)
VLDL Cholesterol Cal: 27 mg/dL (ref 5–40)

## 2018-10-09 ENCOUNTER — Telehealth: Payer: Self-pay

## 2018-10-09 NOTE — Telephone Encounter (Signed)
-----   Message from Jerrol Banana., MD sent at 10/07/2018 11:49 AM EST ----- Labs stable.  Will repeat TSH on next visit.

## 2018-10-09 NOTE — Telephone Encounter (Signed)
Left message to call back  

## 2018-10-12 NOTE — Telephone Encounter (Signed)
LMTCB 10/12/2018  Thanks,   -Mickel Baas

## 2018-10-17 NOTE — Telephone Encounter (Signed)
Left message to call back. Unable to contact patient. Will save message to the chart.

## 2018-12-04 ENCOUNTER — Ambulatory Visit: Payer: Self-pay | Admitting: Family Medicine

## 2018-12-06 ENCOUNTER — Telehealth: Payer: Self-pay

## 2018-12-06 NOTE — Telephone Encounter (Signed)
LMTCB and schedule AWV.  -MM 

## 2018-12-28 ENCOUNTER — Ambulatory Visit: Payer: Self-pay | Admitting: Family Medicine

## 2019-01-23 ENCOUNTER — Ambulatory Visit (INDEPENDENT_AMBULATORY_CARE_PROVIDER_SITE_OTHER): Payer: Medicare Other | Admitting: Family Medicine

## 2019-01-23 ENCOUNTER — Encounter: Payer: Self-pay | Admitting: Family Medicine

## 2019-01-23 VITALS — BP 138/72 | HR 82 | Temp 98.4°F | Resp 20 | Ht 68.0 in | Wt 226.0 lb

## 2019-01-23 DIAGNOSIS — I4891 Unspecified atrial fibrillation: Secondary | ICD-10-CM

## 2019-01-23 DIAGNOSIS — L97519 Non-pressure chronic ulcer of other part of right foot with unspecified severity: Secondary | ICD-10-CM | POA: Diagnosis not present

## 2019-01-23 DIAGNOSIS — I10 Essential (primary) hypertension: Secondary | ICD-10-CM | POA: Diagnosis not present

## 2019-01-23 DIAGNOSIS — E11621 Type 2 diabetes mellitus with foot ulcer: Secondary | ICD-10-CM | POA: Diagnosis not present

## 2019-01-23 DIAGNOSIS — F329 Major depressive disorder, single episode, unspecified: Secondary | ICD-10-CM | POA: Diagnosis not present

## 2019-01-23 DIAGNOSIS — Z794 Long term (current) use of insulin: Secondary | ICD-10-CM

## 2019-01-23 DIAGNOSIS — E039 Hypothyroidism, unspecified: Secondary | ICD-10-CM | POA: Diagnosis not present

## 2019-01-23 DIAGNOSIS — E7849 Other hyperlipidemia: Secondary | ICD-10-CM | POA: Diagnosis not present

## 2019-01-23 DIAGNOSIS — E119 Type 2 diabetes mellitus without complications: Secondary | ICD-10-CM | POA: Diagnosis not present

## 2019-01-23 LAB — POCT GLYCOSYLATED HEMOGLOBIN (HGB A1C): HEMOGLOBIN A1C: 8.9 % — AB (ref 4.0–5.6)

## 2019-01-23 NOTE — Progress Notes (Signed)
Patient: Michael Lucas Male    DOB: 10/09/1946   73 y.o.   MRN: [id number] Visit Date: 01/23/2019  Today's Provider: Wilhemena Durie, MD   Chief Complaint  Patient presents with  . Diabetes  . Hypertension  . Atrial Fibrillation   Subjective:     HPI    Diabetes Mellitus Type II, Follow-up:   Lab Results  Component Value Date   HGBA1C 8.9 (A) 01/23/2019   HGBA1C 7.8 (A) 08/24/2018   HGBA1C 8.0 02/21/2018    Last seen for diabetes 5 months ago.  Management since then includes adding metformin. Patient reports that this was added by the [place]. He is unaware of the dosage.   He reports fair compliance with treatment. He is not having side effects.  Current symptoms include none and have been stable. Home blood sugar records: fasting range: 170s  Episodes of hypoglycemia? no Weight trend: stable Prior visit with dietician: No Current diet: well balanced Current exercise: no regular exercise  Pertinent Labs:    Component Value Date/Time   CHOL 111 10/05/2018 0901   TRIG 137 10/05/2018 0901   HDL 41 10/05/2018 0901   LDLCALC 43 10/05/2018 0901   CREATININE 1.36 (H) 10/05/2018 0901   CREATININE 2.04 (H) 10/26/2013 0554   CREATININE 1.23 11/02/2011 0858    Wt Readings from Last 3 Encounters:  01/23/19 226 lb (102.5 kg)  08/24/18 225 lb (102.1 kg)  05/29/18 227 lb (103 kg)       Hypertension, follow-up:  BP Readings from Last 3 Encounters:  01/23/19 138/72  08/24/18 102/80  05/29/18 (!) 150/74    He was last seen for hypertension 5 months ago.  BP at that visit was 102/80. Management since that visit includes no changes. He reports good compliance with treatment. He is not having side effects.  He is not exercising. He is adherent to low salt diet.   Outside blood pressures are checked occasionally. He is experiencing none.   Allergies  Allergen Reactions  . No Known Allergies      Current Outpatient Medications:  .  atorvastatin  (LIPITOR) 80 MG tablet, Take 80 mg by mouth every evening. , Disp: , Rfl:  .  cholecalciferol (VITAMIN D) 1000 units tablet, Take 1,000 Units by mouth daily., Disp: , Rfl:  .  docusate sodium (COLACE) 50 MG capsule, Take 150 mg by mouth daily., Disp: , Rfl:  .  Emollient (EUCERIN PLUS) 2.5-10 % CREA, Apply 1 application topically daily as needed (dry skin). , Disp: , Rfl:  .  feeding supplement, GLUCERNA SHAKE, (GLUCERNA SHAKE) LIQD, Take 237 mLs by mouth 2 (two) times daily., Disp: , Rfl:  .  furosemide (LASIX) 20 MG tablet, Take 20 mg by mouth daily., Disp: , Rfl:  .  insulin aspart (NOVOLOG) 100 UNIT/ML injection, Inject 18 Units into the skin 3 (three) times daily with meals. , Disp: , Rfl:  .  insulin glargine (LANTUS) 100 UNIT/ML injection, Inject 32 Units into the skin at bedtime. , Disp: , Rfl:  .  ketoconazole (NIZORAL) 2 % shampoo, Apply 1 application topically See admin instructions. Apply shampoo to head every other day, Disp: , Rfl:  .  levothyroxine (SYNTHROID, LEVOTHROID) 50 MCG tablet, Take 50 mcg by mouth daily before breakfast. , Disp: , Rfl:  .  loratadine (ALLERGY RELIEF) 10 MG tablet, Take 10 mg by mouth daily as needed for allergies., Disp: , Rfl:  .  methadone (DOLOPHINE) 10 MG tablet,  Take 10 mg by mouth 2 (two) times daily. , Disp: , Rfl:  .  metoprolol (LOPRESSOR) 100 MG tablet, Take 1 tablet (100 mg total) by mouth 2 (two) times daily., Disp: 60 tablet, Rfl: 6 .  mometasone (ELOCON) 0.1 % cream, Apply 1 application topically daily., Disp: 45 g, Rfl: 0 .  niacin (NIASPAN) 500 MG CR tablet, Take 2 tablets by mouth daily at 12 noon. , Disp: , Rfl:  .  prazosin (MINIPRESS) 2 MG capsule, Take 4 mg by mouth at bedtime. , Disp: , Rfl:  .  pregabalin (LYRICA) 75 MG capsule, Take 75 mg by mouth 2 (two) times daily., Disp: , Rfl:  .  QUEtiapine (SEROQUEL) 100 MG tablet, Take 50 mg by mouth at bedtime., Disp: , Rfl:  .  rivaroxaban (XARELTO) 20 MG TABS tablet, Take 1 tablet (20 mg  total) by mouth daily with supper., Disp: 30 tablet, Rfl: 6 .  sertraline (ZOLOFT) 100 MG tablet, Take 50 mg by mouth daily., Disp: , Rfl:  .  tacrolimus (PROTOPIC) 0.03 % ointment, Apply 1 application topically every other day. Reported on 04/01/2016, Disp: , Rfl:   Review of Systems  Constitutional: Negative.   HENT: Negative.   Eyes: Negative.   Respiratory: Negative.  Negative for cough and shortness of breath.   Cardiovascular: Negative.  Negative for chest pain, palpitations and leg swelling.  Gastrointestinal: Negative.   Endocrine: Negative.   Genitourinary: Negative.   Musculoskeletal: Negative for arthralgias.  Allergic/Immunologic: Negative.   Neurological: Positive for numbness. Negative for dizziness, light-headedness and headaches.  Psychiatric/Behavioral: Positive for dysphoric mood. Negative for agitation and self-injury. The patient is not nervous/anxious.        No suicidal ideation.    Social History   Tobacco Use  . Smoking status: Former Smoker    Types: Cigarettes    Last attempt to quit: 1987    Years since quitting: 33.1  . Smokeless tobacco: Never Used  Substance Use Topics  . Alcohol use: No      Objective:   BP 138/72 (BP Location: Right Arm, Patient Position: Sitting, Cuff Size: Large)   Pulse 82   Temp 98.4 F (36.9 C)   Resp 20   Ht 5\' 8"  (1.727 m)   Wt 226 lb (102.5 kg)   BMI 34.36 kg/m  Vitals:   01/23/19 1543  BP: 138/72  Pulse: 82  Resp: 20  Temp: 98.4 F (36.9 C)  Weight: 226 lb (102.5 kg)  Height: 5\' 8"  (1.727 m)     Physical Exam Vitals signs reviewed.  Constitutional:      Appearance: He is well-developed.  HENT:     Head: Normocephalic and atraumatic.     Right Ear: External ear normal.     Left Ear: External ear normal.     Nose: Nose normal.  Eyes:     General: No scleral icterus.    Conjunctiva/sclera: Conjunctivae normal.  Neck:     Thyroid: No thyromegaly.  Cardiovascular:     Rate and Rhythm: Normal rate  and regular rhythm.     Heart sounds: Normal heart sounds.  Pulmonary:     Effort: Pulmonary effort is normal.     Breath sounds: Normal breath sounds.  Abdominal:     Palpations: Abdomen is soft.  Musculoskeletal:     Comments: Trace edema.  Lymphadenopathy:     Cervical: No cervical adenopathy.  Skin:    General: Skin is warm and dry.  Comments: She has a diabetic foot ulcer at the first MTP joint.  Callus with ulceration in the middle.  No signs of  infection presently  Neurological:     Mental Status: He is alert and oriented to person, place, and time. Mental status is at baseline.     Comments: Cannot feel monofilament foot exam.  Psychiatric:        Behavior: Behavior normal.        Thought Content: Thought content normal.        Judgment: Judgment normal.         Assessment & Plan    1. Type 2 diabetes mellitus treated with insulin (HCC)  Poor Control.  Patient is to check on the Metformin dose he is on. - POCT glycosylated hemoglobin (Hb A1C)--8.9 today.  2. Essential hypertension, benign On metoprolol.   3. Adult hypothyroidism  4. Other hyperlipidemia On Lipitor.  5. Atrial fibrillation, unspecified type ([place]) On Xarelto. - EKG 12-Lead  6. Diabetic ulcer of toe of right foot associated with type 2 diabetes mellitus, unspecified ulcer stage ([place]) Refer to podiatry.  No infection at this time. - Ambulatory referral to Podiatry  7. Major depressive disorder with current active episode, unspecified depression episode severity, unspecified whether recurrent Increase sertraline from 50 to 100 mg daily to clinic 3-4 months. - sertraline (ZOLOFT) 100 MG tablet; Take 1 tablet (100 mg total) by mouth daily.  Dispense: 30 tablet; Refill: 11    I have done the exam and reviewed the above chart and it is accurate to the best of my knowledge. Development worker, community has been used in this note in any air is in the dictation or transcription are  unintentional.  Wilhemena Durie, MD  [place]

## 2019-01-24 MED ORDER — SERTRALINE HCL 100 MG PO TABS
100.0000 mg | ORAL_TABLET | Freq: Every day | ORAL | 11 refills | Status: AC
Start: 2019-01-24 — End: ?

## 2019-01-30 NOTE — Telephone Encounter (Signed)
Scheduled AWV for 03/01/19 @ 2:40 PM. -MM

## 2019-01-30 NOTE — Telephone Encounter (Signed)
This encounter was created in error - please disregard.

## 2019-02-03 ENCOUNTER — Telehealth: Payer: Self-pay | Admitting: Family Medicine

## 2019-02-03 NOTE — Telephone Encounter (Signed)
After hours call Patient apparently ran out of his Lyrica two weeks ago and the daughter called the after hours line to get a refill; he "forgot to ask" at this appointment I reviewed the chart; patient was seen Jan 23, 2019 Per the chart, it doesn't appear that Dr. Rosanna Randy prescribes his Lyrica  She also said that his sugars have been off and when I told her Dr. Rosanna Randy does not prescribe his Lyrica, she said she would contact the daughter and the call was ended  It does not look like Dr. Rosanna Randy manages his diabetes primarily, at least based on prescription history and the fact that another medicine for his diabetes had been added by the [place] documented in his Feb 2020 note It looks like that should be addressed by his [place] doctor as well I wanted the after hours nurse to be sure to bring that up to the patient to contact the [place] about his sugars so appropriate dose adjustments could be made I called the call center back, spoke with Katharine Look again; she had more information His blood sugars were low this morning; the sugars have been all over the place this week; he has been confused and daughter wasn't sure if that was from not having Lyrica or his blood sugars; nurse called the pharmacy; Lyrica must be prescribed by the [facility] because his local pharmacy had not filled it; he was confused too; nurse directed them to urgent care to get checked out He is going to the urgent care center to get checked out and nurse thanked me for the call

## 2019-02-04 ENCOUNTER — Emergency Department
Admission: EM | Admit: 2019-02-04 | Discharge: 2019-02-04 | Disposition: A | Discharge status: Home / Self Care | Payer: Medicare Other | Attending: Emergency Medicine | Admitting: Emergency Medicine

## 2019-02-04 ENCOUNTER — Other Ambulatory Visit: Payer: Self-pay

## 2019-02-04 DIAGNOSIS — F419 Anxiety disorder, unspecified: Secondary | ICD-10-CM | POA: Insufficient documentation

## 2019-02-04 DIAGNOSIS — Z794 Long term (current) use of insulin: Secondary | ICD-10-CM | POA: Diagnosis not present

## 2019-02-04 DIAGNOSIS — N183 Chronic kidney disease, stage 3 (moderate): Secondary | ICD-10-CM | POA: Insufficient documentation

## 2019-02-04 DIAGNOSIS — E039 Hypothyroidism, unspecified: Secondary | ICD-10-CM | POA: Insufficient documentation

## 2019-02-04 DIAGNOSIS — E1122 Type 2 diabetes mellitus with diabetic chronic kidney disease: Secondary | ICD-10-CM | POA: Diagnosis not present

## 2019-02-04 DIAGNOSIS — I5032 Chronic diastolic (congestive) heart failure: Secondary | ICD-10-CM | POA: Diagnosis not present

## 2019-02-04 DIAGNOSIS — I2510 Atherosclerotic heart disease of native coronary artery without angina pectoris: Secondary | ICD-10-CM | POA: Diagnosis not present

## 2019-02-04 DIAGNOSIS — I130 Hypertensive heart and chronic kidney disease with heart failure and stage 1 through stage 4 chronic kidney disease, or unspecified chronic kidney disease: Secondary | ICD-10-CM | POA: Insufficient documentation

## 2019-02-04 DIAGNOSIS — F329 Major depressive disorder, single episode, unspecified: Secondary | ICD-10-CM | POA: Diagnosis not present

## 2019-02-04 DIAGNOSIS — Z79899 Other long term (current) drug therapy: Secondary | ICD-10-CM | POA: Diagnosis not present

## 2019-02-04 DIAGNOSIS — Z951 Presence of aortocoronary bypass graft: Secondary | ICD-10-CM | POA: Diagnosis not present

## 2019-02-04 DIAGNOSIS — Z760 Encounter for issue of repeat prescription: Secondary | ICD-10-CM

## 2019-02-04 DIAGNOSIS — Z8673 Personal history of transient ischemic attack (TIA), and cerebral infarction without residual deficits: Secondary | ICD-10-CM | POA: Diagnosis not present

## 2019-02-04 DIAGNOSIS — Z87891 Personal history of nicotine dependence: Secondary | ICD-10-CM | POA: Diagnosis not present

## 2019-02-04 LAB — COMPREHENSIVE METABOLIC PANEL
ALT: 14 U/L (ref 0–44)
AST: 16 U/L (ref 15–41)
Albumin: 4 g/dL (ref 3.5–5.0)
Alkaline Phosphatase: 76 U/L (ref 38–126)
Anion gap: 8 (ref 5–15)
BUN: 20 mg/dL (ref 8–23)
CO2: 25 mmol/L (ref 22–32)
Calcium: 8.6 mg/dL — ABNORMAL LOW (ref 8.9–10.3)
Chloride: 103 mmol/L (ref 98–111)
Creatinine, Ser: 1.26 mg/dL — ABNORMAL HIGH (ref 0.61–1.24)
GFR calc Af Amer: >60 mL/min (ref >60)
GFR calc non Af Amer: 56 mL/min — ABNORMAL LOW (ref >60)
Glucose, Bld: 193 mg/dL — ABNORMAL HIGH (ref 70–99)
Potassium: 3.9 mmol/L (ref 3.5–5.1)
Sodium: 136 mmol/L (ref 135–145)
Total Bilirubin: 0.7 mg/dL (ref 0.3–1.2)
Total Protein: 7.3 g/dL (ref 6.5–8.1)

## 2019-02-04 LAB — CBC
HCT: 40 % (ref 39.0–52.0)
Hemoglobin: 13.1 g/dL (ref 13.0–17.0)
MCH: 28.9 pg (ref 26.0–34.0)
MCHC: 32.8 g/dL (ref 30.0–36.0)
MCV: 88.3 fL (ref 80.0–100.0)
Platelets: 216 10*3/uL (ref 150–400)
RBC: 4.53 MIL/uL (ref 4.22–5.81)
RDW: 13 % (ref 11.5–15.5)
WBC: 9.4 10*3/uL (ref 4.0–10.5)
nRBC: 0 % (ref 0.0–0.2)

## 2019-02-04 MED ORDER — PREGABALIN 150 MG PO CAPS: 150.0000 mg | ORAL_CAPSULE | Freq: Two times a day (BID) | ORAL | 2 refills | Status: AC

## 2019-02-04 MED ORDER — PREGABALIN 75 MG PO CAPS
150.0000 mg | ORAL_CAPSULE | Freq: Once | ORAL | Status: AC
  Administered 2019-02-04: 150 mg via ORAL
  Filled 2019-02-04: qty 2

## 2019-02-04 NOTE — ED Provider Notes (Signed)
[facility] EMERGENCY DEPARTMENT Provider Note   CSN: [id number] Arrival date & time: 02/04/19  1409    History   Chief Complaint Chief Complaint  Patient presents with  . Altered Mental Status    HPI Michael Lucas is a 73 y.o. male.  Presents to the emergency department for evaluation of medication refill.  Patient presents with daughter who states patient's been out of his medication of Lyrica for 5 days.  Patient takes Lyrica 150 mg twice daily for diabetic neuropathy.  This is typically prescribed by the [place] but he was unable to get there in time for his medication refill and prescription will be mailed out tomorrow.  Patient and daughter deny any altered mental status, confusion, chest pain, shortness of breath, back pain, abdominal pain, urinary symptoms, change in urination color or frequency, skin rashes, cough, congestion, runny nose.  Patient states he is not confused.  Daughter confirms he is not confused and at his baseline.  Daughter and patient states there was miscommunication in the triage note stating his only confusion was about what kind of foods to eat for his diabetes to keep his blood sugar down.     HPI  Past Medical History:  Diagnosis Date  . Anemia   . Ankle fracture, left   . Anxiety   . Arthritis   . Atrial flutter ([place])    a. 12/2008 s/p RFCA. Holter monitor 9/20 showed predominantly NSR with some short runs of atrial fibrillation. Coumadin stopped 7/11 due to frequent falls.  . Carotid arterial disease ([place])    a. 11/2011 U/S: 0-39% bilat ICA stenosis;  b. 03/2013 Carotid U/S: 0-39% bilat ICA stenosis.  . Cervical disc disease   . Chronic back pain   . Chronic diastolic CHF (congestive heart failure) ([place])    a. 07/2009 Echo: EF 50-55%, mild LVH, grade I DD, mild MR; b. 04/2015 Echo: Ef 65-70%, triv AI.  Marland Kitchen Chronic neck pain   . CKD (chronic kidney disease), stage III ([place])    stage III  . Coronary artery disease    a. 1995 s/p CABG  x 1 (LIMA->LAD);  b. 04/2001 Cath: LAD 100, patent LIMA->LAD, otw nonobs LCX/RCA dzs.  . Depression   . Dermatitis   . Diabetes mellitus   . Dyspnea   . Dysrhythmia   . Edema    FEET/LEGS  . Fractured fibula   . Heart murmur   . Hypertensive heart disease   . Hypothyroidism   . Nephrolithiasis    h/o  . Orthopnea    2-3 PILLOWS  . OSA (obstructive sleep apnea)    a. Unable to tolerate CPAP because of allergic reaction to straps of face mask.  . Paroxysmal atrial fibrillation ([place])    a. 07/2009 PAF noted on holter; b. 05/2010 Coumadin d/c'd 2/2 falls; c. 02/2015 CHA2DS2VASc = 5-->Xarelto.  . Peripheral neuropathy    suspect diabetes related. has led to gait instability. seen by [facility] neurology, head MRI showed only mild diffuse atrophy with moderate peri-sylvan atrophy.  Marland Kitchen PTSD (post-traumatic stress disorder)    takes prazosin for this.  . Pulmonary embolism ([place]) 2010  . Stroke [facility])    POSSIBLE TIA  . Wears dentures    full upper and lower (only wears upper)    Patient Active Problem List   Diagnosis Date Noted  . Tendinitis of right wrist 01/25/2017  . Hypertensive heart disease   . Chronic diastolic CHF (congestive heart failure) ([place])   .  Paroxysmal atrial fibrillation (HCC)   . CKD (chronic kidney disease), stage III ([place])   . Coronary artery disease   . Absolute anemia 04/03/2015  . Arthritis 04/03/2015  . Chronic pain associated with significant psychosocial dysfunction 04/03/2015  . Constipation 04/03/2015  . Coronary artery abnormality 04/03/2015  . Type 2 diabetes mellitus treated with insulin ([place]) 04/03/2015  . Essential (primary) hypertension 04/03/2015  . Broken fibula 04/03/2015  . Acid reflux 04/03/2015  . Adult hypothyroidism 04/03/2015  . Acute kidney failure ([place]) 04/03/2015  . Calculus of kidney 04/03/2015  . Neuropathy 04/03/2015  . Arthritis, degenerative 04/03/2015  . 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 AF (paroxysmal atrial fibrillation) ([place])  04/03/2015  . Decreased potassium in the blood 04/03/2015  . Neurosis, posttraumatic 04/03/2015  . Contusion of chest wall 04/03/2015  . Dermatitis seborrheica 04/03/2015  . Episode of syncope 04/03/2015  . Malaise 04/03/2015  . Clinical depression 04/03/2015  . Bilateral carotid artery stenosis 02/05/2014  . Orthostatic hypotension 06/02/2012  . Carotid bruit 11/05/2011  . Hyperlipidemia 04/10/2011  . UNSTEADY GAIT 06/24/2010  . DIZZINESS 01/21/2010  . ATRIAL FIBRILLATION 09/08/2009  . OCCLUSION&STENOS CAROTID ART W/O MENTION INFARCT 09/08/2009  . DIASTOLIC HEART FAILURE, CHRONIC 08/12/2009  . EDEMA 08/12/2009  . Essential hypertension, benign 06/16/2009  . CAD, ARTERY BYPASS GRAFT 06/16/2009  . ATRIAL FLUTTER 06/16/2009  . LEG PAIN, BILATERAL 06/16/2009    Past Surgical History:  Procedure Laterality Date  . ABLATION     CARDIAC  . CATARACT EXTRACTION W/PHACO Right 11/04/2016   Procedure: CATARACT EXTRACTION PHACO AND INTRAOCULAR LENS PLACEMENT (IOC);  Surgeon: Eulogio Bear, MD;  Location: [facility] ORS;  Service: Ophthalmology;  Laterality: Right;  [place] 53.7 AP% 12.6 CDE 6.75 Fluid pack lot # [id number]  . CATARACT EXTRACTION W/PHACO Left 12/02/2016   Procedure: CATARACT EXTRACTION PHACO AND INTRAOCULAR LENS PLACEMENT (IOC);  Surgeon: Eulogio Bear, MD;  Location: [facility] ORS;  Service: Ophthalmology;  Laterality: Left;  [place] 43.5 AP% 12.5 CDE 5.48 Fluid Pack lot # [id number]  . COLONOSCOPY WITH PROPOFOL N/A 08/26/2017   Procedure: COLONOSCOPY WITH PROPOFOL;  Surgeon: Lucilla Lame, MD;  Location: [facility];  Service: Gastroenterology;  Laterality: N/A;  Diabetic - insulin  . CORONARY ARTERY BYPASS GRAFT  1995  . HEMORRHOID SURGERY    . HERNIA REPAIR    . POLYPECTOMY  08/26/2017   Procedure: POLYPECTOMY;  Surgeon: Lucilla Lame, MD;  Location: [facility];  Service: Gastroenterology;;  . [place]    . ROTATOR CUFF REPAIR          Home Medications     Prior to Admission medications   Medication Sig Start Date End Date Taking? Authorizing Provider  atorvastatin (LIPITOR) 80 MG tablet Take 80 mg by mouth every evening.     [provider]  cholecalciferol (VITAMIN D) 1000 units tablet Take 1,000 Units by mouth daily.    [provider]  docusate sodium (COLACE) 50 MG capsule Take 150 mg by mouth daily.    [provider]  Emollient (EUCERIN PLUS) 2.5-10 % CREA Apply 1 application topically daily as needed (dry skin).     [provider]  feeding supplement, GLUCERNA SHAKE, (GLUCERNA SHAKE) LIQD Take 237 mLs by mouth 2 (two) times daily.    [provider]  furosemide (LASIX) 20 MG tablet Take 20 mg by mouth daily.    [provider]  insulin aspart (NOVOLOG) 100 UNIT/ML injection Inject 18 Units into the skin 3 (three)  times daily with meals.  05/01/14   [provider]  insulin glargine (LANTUS) 100 UNIT/ML injection Inject 32 Units into the skin at bedtime.  05/01/14   [provider]  ketoconazole (NIZORAL) 2 % shampoo Apply 1 application topically See admin instructions. Apply shampoo to head every other day 05/01/14   [provider]  levothyroxine (SYNTHROID, LEVOTHROID) 50 MCG tablet Take 50 mcg by mouth daily before breakfast.  01/17/14   [provider]  loratadine (ALLERGY RELIEF) 10 MG tablet Take 10 mg by mouth daily as needed for allergies.    [provider]  methadone (DOLOPHINE) 10 MG tablet Take 10 mg by mouth 2 (two) times daily.     [provider]  metoprolol (LOPRESSOR) 100 MG tablet Take 1 tablet (100 mg total) by mouth 2 (two) times daily. 03/04/15   Wellington Hampshire, MD  mometasone (ELOCON) 0.1 % cream Apply 1 application topically daily. 08/24/18   Jerrol Banana., MD  niacin (NIASPAN) 500 MG CR tablet Take 2 tablets by mouth daily at 12 noon.  09/30/10   [provider]  prazosin (MINIPRESS) 2 MG capsule  Take 4 mg by mouth at bedtime.     [provider]  pregabalin (LYRICA) 150 MG capsule Take 1 capsule (150 mg total) by mouth 2 (two) times daily. 02/04/19 02/04/20  Duanne Guess, PA-C  QUEtiapine (SEROQUEL) 100 MG tablet Take 50 mg by mouth at bedtime.    [provider]  rivaroxaban (XARELTO) 20 MG TABS tablet Take 1 tablet (20 mg total) by mouth daily with supper. 03/04/15   Wellington Hampshire, MD  sertraline (ZOLOFT) 100 MG tablet Take 1 tablet (100 mg total) by mouth daily. 01/24/19   Jerrol Banana., MD  tacrolimus (PROTOPIC) 0.03 % ointment Apply 1 application topically every other day. Reported on 04/01/2016 05/01/14   [provider]    Family History Family History  Problem Relation Age of Onset  . Cancer Mother        Died from lung cancer  . Heart disease Father   . Cancer Sister        Rectal cancer  . Cancer Sister        colon cancer  . Alcohol abuse Sister        died from alcohol poisoning    Social History Social History   Tobacco Use  . Smoking status: Former Smoker    Types: Cigarettes    Last attempt to quit: 1987    Years since quitting: 33.2  . Smokeless tobacco: Never Used  Substance Use Topics  . Alcohol use: No  . Drug use: No     Allergies   Patient has no known allergies.   Review of Systems Review of Systems  Constitutional: Negative for fever.  Respiratory: Negative for shortness of breath.   Cardiovascular: Negative for chest pain.  Gastrointestinal: Negative for abdominal pain, diarrhea, nausea and vomiting.  Genitourinary: Negative for difficulty urinating, dysuria and urgency.  Musculoskeletal: Negative for back pain and myalgias.  Skin: Negative for rash.  Neurological: Negative for dizziness and headaches.  Psychiatric/Behavioral: Negative for agitation, confusion and decreased concentration.     Physical Exam Updated Vital Signs BP (!) 173/80   Pulse 87   Temp 98.4 F (36.9 C) (Oral)   Resp 14    Wt 97.5 kg   SpO2 94%   BMI 32.69 kg/m   Physical Exam Constitutional:  Appearance: He is well-developed.  HENT:     Head: Normocephalic and atraumatic.  Eyes:     Conjunctiva/sclera: Conjunctivae normal.  Neck:     Musculoskeletal: Normal range of motion.  Cardiovascular:     Rate and Rhythm: Regular rhythm.  Pulmonary:     Effort: Pulmonary effort is normal. No respiratory distress.     Breath sounds: Normal breath sounds. No wheezing.  Abdominal:     General: There is no distension.     Tenderness: There is no abdominal tenderness. There is no guarding.  Musculoskeletal: Normal range of motion.  Skin:    General: Skin is warm.     Findings: No rash.  Neurological:     Mental Status: He is alert and oriented to person, place, and time.  Psychiatric:        Behavior: Behavior normal.        Thought Content: Thought content normal.      ED Treatments / Results  Labs (all labs ordered are listed, but only abnormal results are displayed) Labs Reviewed  COMPREHENSIVE METABOLIC PANEL - Abnormal; Notable for the following components:      Result Value   Glucose, Bld 193 (*)    Creatinine, Ser 1.26 (*)    Calcium 8.6 (*)    GFR calc non Af Amer 56 (*)    All other components within normal limits  CBC    EKG None  Radiology No results found.  Procedures Procedures (including critical care time)  Medications Ordered in ED Medications  pregabalin (LYRICA) capsule 150 mg (150 mg Oral Given 02/04/19 1713)     Initial Impression / Assessment and Plan / ED Course  I have reviewed the triage vital signs and the nursing notes.  Pertinent labs & imaging results that were available during my care of the patient were reviewed by me and considered in my medical decision making (see chart for details).        73 year old male presents with daughter to the emergency department for medication refill.  He is out of his Lyrica.  He takes 150 mg twice daily for  neuropathy.  Drug database checked and confirmed dosage.  Patient given refill medication today and will follow-up as needed.  Final Clinical Impressions(s) / ED Diagnoses   Final diagnoses:  Medication refill    ED Discharge Orders         Ordered    pregabalin (LYRICA) 150 MG capsule  2 times daily     02/04/19 1718           Renata Caprice 02/04/19 1722    Delman Kitten, MD 02/04/19 2253

## 2019-02-04 NOTE — Discharge Instructions (Addendum)
Please take medication as prescribed.  Return to the ER for any urgent changes in your health.

## 2019-02-04 NOTE — ED Notes (Signed)
Pt is out of regular home meds and will be unable to get them from the [place] until Tues or Wed. Pt in no acute distress, presently.

## 2019-02-04 NOTE — ED Triage Notes (Signed)
Pt presents to ED reports pt has not been taking Pregabalin as prescribed due to not having medication. Referred to ED by [facility] hospital per pt report. Daughter reports pt has not been eating and has been confused.

## 2019-02-21 ENCOUNTER — Telehealth: Payer: Self-pay

## 2019-02-21 NOTE — Telephone Encounter (Signed)
Called pt to r/ AWV to over the phone. Current apt is 03/01/19. Left a detailed message requesting a CB for the OK to change and verify a number to call. Direct # left to CB on. -MM

## 2019-03-01 ENCOUNTER — Ambulatory Visit: Payer: Medicare Other

## 2019-03-26 DIAGNOSIS — L851 Acquired keratosis [keratoderma] palmaris et plantaris: Secondary | ICD-10-CM | POA: Diagnosis not present

## 2019-03-26 DIAGNOSIS — B351 Tinea unguium: Secondary | ICD-10-CM | POA: Diagnosis not present

## 2019-03-26 DIAGNOSIS — Z794 Long term (current) use of insulin: Secondary | ICD-10-CM | POA: Diagnosis not present

## 2019-03-26 DIAGNOSIS — E1140 Type 2 diabetes mellitus with diabetic neuropathy, unspecified: Secondary | ICD-10-CM | POA: Diagnosis not present

## 2019-04-06 ENCOUNTER — Telehealth: Payer: Self-pay

## 2019-04-06 NOTE — Telephone Encounter (Signed)
Called patient form recall list.  Patient would like to wait for an in office appointment.  Old recall deleted, New recall placed for Aug.

## 2019-05-24 ENCOUNTER — Ambulatory Visit: Payer: Self-pay | Admitting: Family Medicine

## 2019-06-25 DIAGNOSIS — B351 Tinea unguium: Secondary | ICD-10-CM | POA: Diagnosis not present

## 2019-06-25 DIAGNOSIS — Z794 Long term (current) use of insulin: Secondary | ICD-10-CM | POA: Diagnosis not present

## 2019-06-25 DIAGNOSIS — L851 Acquired keratosis [keratoderma] palmaris et plantaris: Secondary | ICD-10-CM | POA: Diagnosis not present

## 2019-06-25 DIAGNOSIS — E1140 Type 2 diabetes mellitus with diabetic neuropathy, unspecified: Secondary | ICD-10-CM | POA: Diagnosis not present

## 2019-06-28 ENCOUNTER — Other Ambulatory Visit: Payer: Self-pay

## 2019-07-02 ENCOUNTER — Telehealth: Payer: Self-pay

## 2019-07-02 NOTE — Telephone Encounter (Signed)
Scheduled AWV for 07/12/19 @ 2:40 PM.  -MM

## 2019-07-02 NOTE — Telephone Encounter (Signed)
[facility] covid screening

## 2019-07-03 ENCOUNTER — Ambulatory Visit: Payer: Medicare Other | Admitting: Family Medicine

## 2019-07-11 NOTE — Progress Notes (Signed)
Subjective:   Michael Lucas is a 73 y.o. male who presents for Medicare Annual/Initial preventive examination.    This visit is being conducted through telemedicine due to the COVID-19 pandemic. This patient has given me verbal consent via doximity to conduct this visit, patient states they are participating from their home address. Some vital signs may be absent or patient reported.    Patient identification: identified by name, DOB, and current address  Review of Systems:  N/A  Cardiac Risk Factors include: advanced age (>32men, >42 women);diabetes mellitus;dyslipidemia;hypertension;male gender     Objective:    Vitals: There were no vitals taken for this visit.  There is no height or weight on file to calculate BMI. Unable to obtain vitals due to visit being conducted via telephonically.   Advanced Directives 07/12/2019 02/04/2019 08/26/2017 07/19/2017 12/02/2016  Does Patient Have a Medical Advance Directive? No No No No No  Would patient like information on creating a medical advance directive? No - Patient declined No - Patient declined No - Patient declined - No - Patient declined    Tobacco Social History   Tobacco Use  Smoking Status Former Smoker  . Types: Cigarettes  . Quit date: 73  . Years since quitting: 33.6  Smokeless Tobacco Never Used     Counseling given: Not Answered   Clinical Intake:  Pre-visit preparation completed: Yes  Pain : No/denies pain Pain Score: 0-No pain(Has intermitten chronic pain.)     Nutritional Risks: None Diabetes: Yes  How often do you need to have someone help you when you read instructions, pamphlets, or other written materials from your doctor or pharmacy?: 1 - Never   Diabetes:  Is the patient diabetic?  Yes type 2 If diabetic, was a CBG obtained today?  No  Did the patient bring in their glucometer from home?  No  How often do you monitor your CBG's? Two to three times daily.   Financial Strains and Diabetes  Management:  Are you having any financial strains with the device, your supplies or your medication? No .  Does the patient want to be seen by Chronic Care Management for management of their diabetes?  No  Would the patient like to be referred to a Nutritionist or for Diabetic Management?  No   Diabetic Exams:  Diabetic Eye Exam: Completed 10/11/16. Overdue for diabetic eye exam. Pt has been advised about the importance in completing this exam.   Diabetic Foot Exam: Completed 06/25/19. Repeat yearly.   Interpreter Needed?: No  Information entered by :: [facility], LPN  Past Medical History:  Diagnosis Date  . Anemia   . Ankle fracture, left   . Anxiety   . Arthritis   . Atrial flutter ([place])    a. 12/2008 s/p RFCA. Holter monitor 9/20 showed predominantly NSR with some short runs of atrial fibrillation. Coumadin stopped 7/11 due to frequent falls.  . Carotid arterial disease ([place])    a. 11/2011 U/S: 0-39% bilat ICA stenosis;  b. 03/2013 Carotid U/S: 0-39% bilat ICA stenosis.  . Cervical disc disease   . Chronic back pain   . Chronic diastolic CHF (congestive heart failure) ([place])    a. 07/2009 Echo: EF 50-55%, mild LVH, grade I DD, mild MR; b. 04/2015 Echo: Ef 65-70%, triv AI.  Marland Kitchen Chronic neck pain   . CKD (chronic kidney disease), stage III ([place])    stage III  . Coronary artery disease    a. 1995 s/p CABG x 1 (LIMA->LAD);  b. 04/2001 Cath: LAD 100, patent LIMA->LAD, otw nonobs LCX/RCA dzs.  . Depression   . Dermatitis   . Diabetes mellitus   . Dyspnea   . Dysrhythmia   . Edema    FEET/LEGS  . Fractured fibula   . Heart murmur   . Hypertensive heart disease   . Hypothyroidism   . Nephrolithiasis    h/o  . Orthopnea    2-3 PILLOWS  . OSA (obstructive sleep apnea)    a. Unable to tolerate CPAP because of allergic reaction to straps of face mask.  . Paroxysmal atrial fibrillation ([place])    a. 07/2009 PAF noted on holter; b. 05/2010 Coumadin d/c'd 2/2 falls; c. 02/2015 CHA2DS2VASc =  5-->Xarelto.  . Peripheral neuropathy    suspect diabetes related. has led to gait instability. seen by [facility], head MRI showed only mild diffuse atrophy with moderate peri-sylvan atrophy.  Marland Kitchen PTSD (post-traumatic stress disorder)    takes prazosin for this.  . Pulmonary embolism ([place]) 2010  . Stroke [facility])    POSSIBLE TIA  . Wears dentures    full upper and lower (only wears upper)   Past Surgical History:  Procedure Laterality Date  . ABLATION     CARDIAC  . CATARACT EXTRACTION W/PHACO Right 11/04/2016   Procedure: CATARACT EXTRACTION PHACO AND INTRAOCULAR LENS PLACEMENT (IOC);  Surgeon: Eulogio Bear, MD;  Location: [facility] ORS;  Service: Ophthalmology;  Laterality: Right;  [place] 53.7 AP% 12.6 CDE 6.75 Fluid pack lot # [id number]  . CATARACT EXTRACTION W/PHACO Left 12/02/2016   Procedure: CATARACT EXTRACTION PHACO AND INTRAOCULAR LENS PLACEMENT (IOC);  Surgeon: Eulogio Bear, MD;  Location: [facility] ORS;  Service: Ophthalmology;  Laterality: Left;  [place] 43.5 AP% 12.5 CDE 5.48 Fluid Pack lot # [id number]  . COLONOSCOPY WITH PROPOFOL N/A 08/26/2017   Procedure: COLONOSCOPY WITH PROPOFOL;  Surgeon: Lucilla Lame, MD;  Location: [place];  Service: Gastroenterology;  Laterality: N/A;  Diabetic - insulin  . CORONARY ARTERY BYPASS GRAFT  1995  . HEMORRHOID SURGERY    . HERNIA REPAIR    . POLYPECTOMY  08/26/2017   Procedure: POLYPECTOMY;  Surgeon: Lucilla Lame, MD;  Location: [place];  Service: Gastroenterology;;  . [place]    . ROTATOR CUFF REPAIR     Family History  Problem Relation Age of Onset  . Cancer Mother        Died from lung cancer  . Heart disease Father   . Cancer Sister        Rectal cancer  . Cancer Sister        colon cancer  . Alcohol abuse Sister        died from alcohol poisoning   Social History   Socioeconomic History  . Marital status: Widowed    Spouse name: Not on file  . Number of children: 1  . Years of  education: Not on file  . Highest education level: Doctorate  Occupational History  . Occupation: Disabled    Employer: SELF EMPLOYED  Social Needs  . Financial resource strain: Not hard at all  . Food insecurity    Worry: Never true    Inability: Never true  . Transportation needs    Medical: No    Non-medical: No  Tobacco Use  . Smoking status: Former Smoker    Types: Cigarettes    Quit date: 1987    Years since quitting: 33.6  . Smokeless tobacco: Never Used  Substance and  Sexual Activity  . Alcohol use: No  . Drug use: No  . Sexual activity: Not on file  Lifestyle  . Physical activity    Days per week: 0 days    Minutes per session: 0 min  . Stress: Not at all  Relationships  . Social Herbalist on phone: Patient refused    Gets together: Patient refused    Attends religious service: Patient refused    Active member of club or organization: Patient refused    Attends meetings of clubs or organizations: Patient refused    Relationship status: Patient refused  Other Topics Concern  . Not on file  Social History Narrative   Lives in [place].    Outpatient Encounter Medications as of 07/12/2019  Medication Sig  . atorvastatin (LIPITOR) 80 MG tablet Take 80 mg by mouth every evening.   . cholecalciferol (VITAMIN D) 1000 units tablet Take 1,000 Units by mouth daily.  Marland Kitchen docusate sodium (COLACE) 50 MG capsule Take 200 mg by mouth daily.   . Emollient (EUCERIN PLUS) 2.5-10 % CREA Apply 1 application topically daily as needed (dry skin).   . feeding supplement, GLUCERNA SHAKE, (GLUCERNA SHAKE) LIQD Take 237 mLs by mouth daily.   . furosemide (LASIX) 20 MG tablet Take 20 mg by mouth daily. As needed  . insulin glargine (LANTUS) 100 UNIT/ML injection Inject 44 Units into the skin at bedtime.   Marland Kitchen ketoconazole (NIZORAL) 2 % shampoo Apply 1 application topically See admin instructions. Apply shampoo to head every other day  . levothyroxine (SYNTHROID, LEVOTHROID)  50 MCG tablet Take 50 mcg by mouth daily before breakfast.   . metFORMIN (GLUCOPHAGE) 500 MG tablet Take 500 mg by mouth daily with breakfast.   . methadone (DOLOPHINE) 10 MG tablet Take 10 mg by mouth 2 (two) times daily.   . metoprolol (LOPRESSOR) 100 MG tablet Take 1 tablet (100 mg total) by mouth 2 (two) times daily.  . prazosin (MINIPRESS) 2 MG capsule Take 4 mg by mouth at bedtime.   . pregabalin (LYRICA) 150 MG capsule Take 1 capsule (150 mg total) by mouth 2 (two) times daily.  . QUEtiapine (SEROQUEL) 100 MG tablet Take 50 mg by mouth at bedtime.  . rivaroxaban (XARELTO) 20 MG TABS tablet Take 1 tablet (20 mg total) by mouth daily with supper.  . sertraline (ZOLOFT) 100 MG tablet Take 1 tablet (100 mg total) by mouth daily.  . tacrolimus (PROTOPIC) 0.03 % ointment Apply 1 application topically every other day. Reported on 04/01/2016  . insulin aspart (NOVOLOG) 100 UNIT/ML injection Inject 18 Units into the skin 3 (three) times daily with meals.   Marland Kitchen loratadine (ALLERGY RELIEF) 10 MG tablet Take 10 mg by mouth daily as needed for allergies.  . mometasone (ELOCON) 0.1 % cream Apply 1 application topically daily.  . niacin (NIASPAN) 500 MG CR tablet Take 2 tablets by mouth daily at 12 noon.    No facility-administered encounter medications on file as of 07/12/2019.     Activities of Daily Living In your present state of health, do you have any difficulty performing the following activities: 07/12/2019  Hearing? N  Vision? N  Difficulty concentrating or making decisions? Y  Walking or climbing stairs? Y  Comment Due to neck and back pains.  Dressing or bathing? N  Doing errands, shopping? N  Preparing Food and eating ? N  Using the Toilet? N  In the past six months, have you accidently leaked urine?  N  Do you have problems with loss of bowel control? N  Managing your Medications? N  Managing your Finances? N  Housekeeping or managing your Housekeeping? N  Some recent data might be  hidden    Patient Care Team: Jerrol Banana., MD as PCP - General (Family Medicine) Wellington Hampshire, MD as Consulting Physician (Cardiology) Sharlotte Alamo, DPM (Podiatry) [place], [facility])   Assessment:   This is a routine wellness examination for H[place].  Exercise Activities and Dietary recommendations Current Exercise Habits: The patient does not participate in regular exercise at present, Exercise limited by: orthopedic condition(s)  Goals    . LIFESTYLE - DECREASE FALLS RISK     Recommend to remove any items from the home that may cause slips or trips.       Fall Risk Fall Risk  07/12/2019 03/23/2018 03/23/2018 02/21/2018 02/21/2018  Falls in the past year? 1 Yes Yes No No  Number falls in past yr: 1 1 1  - -  Injury with Fall? 0 Yes Yes - -  Risk Factor Category  - High Fall Risk High Fall Risk - -  Risk for fall due to : - Impaired balance/gait - - -  Follow up Falls prevention discussed Falls prevention discussed Falls prevention discussed - -   FALL RISK PREVENTION PERTAINING TO THE HOME:  Any stairs in or around the home? Yes  If so, are there any without handrails? No   Home free of loose throw rugs in walkways, pet beds, electrical cords, etc? Yes  Adequate lighting in your home to reduce risk of falls? Yes   ASSISTIVE DEVICES UTILIZED TO PREVENT FALLS:  Life alert? No  Use of a cane, walker or w/c? Yes  Grab bars in the bathroom? Yes  Shower chair or bench in shower? Yes  Elevated toilet seat or a handicapped toilet? No    TIMED UP AND GO:  Was the test performed? No .    Depression Screen PHQ 2/9 Scores 07/12/2019 03/23/2018  PHQ - 2 Score 0 0    Cognitive Function: Declined today.         Immunization History  Administered Date(s) Administered  . Influenza, High Dose Seasonal PF 08/24/2018  . Pneumococcal Conjugate-13 09/03/2014  . Pneumococcal Polysaccharide-23 02/21/2018  . Tdap 09/19/2009    Qualifies for Shingles  Vaccine? Yes . Due for Shingrix. Education has been provided regarding the importance of this vaccine. Pt has been advised to call insurance company to determine out of pocket expense. Advised may also receive vaccine at local pharmacy or Health Dept. Verbalized acceptance and understanding.  Tdap: Up to date  Flu Vaccine: Due fall 2020  Pneumococcal Vaccine: Completed series  Screening Tests Health Maintenance  Topic Date Due  . OPHTHALMOLOGY EXAM  10/04/2017  . COLONOSCOPY  08/26/2018  . INFLUENZA VACCINE  06/30/2019  . HEMOGLOBIN A1C  07/24/2019  . TETANUS/TDAP  09/20/2019  . FOOT EXAM  06/24/2020  . Hepatitis C Screening  Completed  . PNA vac Low Risk Adult  Completed   Cancer Screenings:  Colorectal Screening: Completed 08/26/17. Repeat yearly. Referral to GI placed today. Pt aware the office will call re: appt.  Lung Cancer Screening: (Low Dose CT Chest recommended if Age 37-80 years, 30 pack-year currently smoking OR have quit w/in 15years.) does not qualify.   Additional Screening:  Hepatitis C Screening: Up to date  Dental Screening: Recommended annual dental exams for proper oral hygiene  [employer]  Referral:  CRR required this visit?  No        Plan:  I have personally reviewed and addressed the Medicare Annual Wellness questionnaire and have noted the following in the patient's chart:  A. Medical and social history B. Use of alcohol, tobacco or illicit drugs  C. Current medications and supplements D. Functional ability and status E.  Nutritional status F.  Physical activity G. Advance directives H. List of other physicians I.  Hospitalizations, surgeries, and ER visits in previous 12 months J.  [place] such as hearing and vision if needed, cognitive and depression L. Referrals and appointments   In addition, I have reviewed and discussed with patient certain preventive protocols, quality metrics, and best practice recommendations. A  written personalized care plan for preventive services as well as general preventive health recommendations were provided to patient.   Glendora Score, [place]  02/25/5187 Nurse Health Advisor  Nurse Notes: Pt to set up an eye exam this year.

## 2019-07-12 ENCOUNTER — Other Ambulatory Visit: Payer: Self-pay

## 2019-07-12 ENCOUNTER — Ambulatory Visit (INDEPENDENT_AMBULATORY_CARE_PROVIDER_SITE_OTHER): Payer: Medicare Other

## 2019-07-12 DIAGNOSIS — Z0000 Encounter for general adult medical examination without abnormal findings: Secondary | ICD-10-CM | POA: Diagnosis not present

## 2019-07-12 DIAGNOSIS — Z1211 Encounter for screening for malignant neoplasm of colon: Secondary | ICD-10-CM

## 2019-07-12 NOTE — Patient Instructions (Signed)
Michael Lucas , Thank you for taking time to come for your Medicare Wellness Visit. I appreciate your ongoing commitment to your health goals. Please review the following plan we discussed and let me know if I can assist you in the future.   Screening recommendations/referrals: Colonoscopy: Referral to GI placed today. Pt aware the office will call re: appt Recommended yearly ophthalmology/optometry visit for glaucoma screening and checkup Recommended yearly dental visit for hygiene and checkup  Vaccinations: Influenza vaccine: Due fall 2020 Pneumococcal vaccine: Completed series Tdap vaccine: Up to date, due 08/2019 Shingles vaccine: Pt declines today.     Advanced directives: Advance directive discussed with you today. Even though you declined this today please call our office should you change your mind and we can give you the proper paperwork for you to fill out.  Conditions/risks identified: Fall risk prevention discussed today.   Next appointment: Follow up scheduled with Dr Rosanna Randy 07/18/19 @ 3:20 PM. Declined scheduling an AWV for 2021 at this time.   Preventive Care 56 Years and Older, Male Preventive care refers to lifestyle choices and visits with your health care provider that can promote health and wellness. What does preventive care include?  A yearly physical exam. This is also called an annual well check.  Dental exams once or twice a year.  Routine eye exams. Ask your health care provider how often you should have your eyes checked.  Personal lifestyle choices, including:  Daily care of your teeth and gums.  Regular physical activity.  Eating a healthy diet.  Avoiding tobacco and drug use.  Limiting alcohol use.  Practicing safe sex.  Taking low doses of aspirin every day.  Taking vitamin and mineral supplements as recommended by your health care provider. What happens during an annual well check? The services and screenings done by your health care  provider during your annual well check will depend on your age, overall health, lifestyle risk factors, and family history of disease. Counseling  Your health care provider may ask you questions about your:  Alcohol use.  Tobacco use.  Drug use.  Emotional well-being.  Home and relationship well-being.  Sexual activity.  Eating habits.  History of falls.  Memory and ability to understand (cognition).  Work and work Statistician. Screening  You may have the following tests or measurements:  Height, weight, and BMI.  Blood pressure.  Lipid and cholesterol levels. These may be checked every 5 years, or more frequently if you are over 59 years old.  Skin check.  Lung cancer screening. You may have this screening every year starting at age 53 if you have a 30-pack-year history of smoking and currently smoke or have quit within the past 15 years.  Fecal occult blood test (FOBT) of the stool. You may have this test every year starting at age 11.  Flexible sigmoidoscopy or colonoscopy. You may have a sigmoidoscopy every 5 years or a colonoscopy every 10 years starting at age 33.  Prostate cancer screening. Recommendations will vary depending on your family history and other risks.  Hepatitis C blood test.  Hepatitis B blood test.  Sexually transmitted disease (STD) testing.  Diabetes screening. This is done by checking your blood sugar (glucose) after you have not eaten for a while (fasting). You may have this done every 1-3 years.  Abdominal aortic aneurysm (AAA) screening. You may need this if you are a current or former smoker.  Osteoporosis. You may be screened starting at age 39 if you are  at high risk. Talk with your health care provider about your test results, treatment options, and if necessary, the need for more tests. Vaccines  Your health care provider may recommend certain vaccines, such as:  Influenza vaccine. This is recommended every year.  Tetanus,  diphtheria, and acellular pertussis (Tdap, Td) vaccine. You may need a Td booster every 10 years.  Zoster vaccine. You may need this after age 49.  Pneumococcal 13-valent conjugate (PCV13) vaccine. One dose is recommended after age 62.  Pneumococcal polysaccharide (PPSV23) vaccine. One dose is recommended after age 54. Talk to your health care provider about which screenings and vaccines you need and how often you need them. This information is not intended to replace advice given to you by your health care provider. Make sure you discuss any questions you have with your health care provider. Document Released: 12/12/2015 Document Revised: 08/04/2016 Document Reviewed: 09/16/2015 Elsevier Interactive Patient Education  2017 [place] Prevention in the Home Falls can cause injuries. They can happen to people of all ages. There are many things you can do to make your home safe and to help prevent falls. What can I do on the outside of my home?  Regularly fix the edges of walkways and driveways and fix any cracks.  Remove anything that might make you trip as you walk through a door, such as a raised step or threshold.  Trim any bushes or trees on the path to your home.  Use bright outdoor lighting.  Clear any walking paths of anything that might make someone trip, such as rocks or tools.  Regularly check to see if handrails are loose or broken. Make sure that both sides of any steps have handrails.  Any raised decks and porches should have guardrails on the edges.  Have any leaves, snow, or ice cleared regularly.  Use sand or salt on walking paths during winter.  Clean up any spills in your garage right away. This includes oil or grease spills. What can I do in the bathroom?  Use night lights.  Install grab bars by the toilet and in the tub and shower. Do not use towel bars as grab bars.  Use non-skid mats or decals in the tub or shower.  If you need to sit down in  the shower, use a plastic, non-slip stool.  Keep the floor dry. Clean up any water that spills on the floor as soon as it happens.  Remove soap buildup in the tub or shower regularly.  Attach bath mats securely with double-sided non-slip rug tape.  Do not have throw rugs and other things on the floor that can make you trip. What can I do in the bedroom?  Use night lights.  Make sure that you have a light by your bed that is easy to reach.  Do not use any sheets or blankets that are too big for your bed. They should not hang down onto the floor.  Have a firm chair that has side arms. You can use this for support while you get dressed.  Do not have throw rugs and other things on the floor that can make you trip. What can I do in the kitchen?  Clean up any spills right away.  Avoid walking on wet floors.  Keep items that you use a lot in easy-to-reach places.  If you need to reach something above you, use a strong step stool that has a grab bar.  Keep electrical cords out of  the way.  Do not use floor polish or wax that makes floors slippery. If you must use wax, use non-skid floor wax.  Do not have throw rugs and other things on the floor that can make you trip. What can I do with my stairs?  Do not leave any items on the stairs.  Make sure that there are handrails on both sides of the stairs and use them. Fix handrails that are broken or loose. Make sure that handrails are as long as the stairways.  Check any carpeting to make sure that it is firmly attached to the stairs. Fix any carpet that is loose or worn.  Avoid having throw rugs at the top or bottom of the stairs. If you do have throw rugs, attach them to the floor with carpet tape.  Make sure that you have a light switch at the top of the stairs and the bottom of the stairs. If you do not have them, ask someone to add them for you. What else can I do to help prevent falls?  Wear shoes that:  Do not have high  heels.  Have rubber bottoms.  Are comfortable and fit you well.  Are closed at the toe. Do not wear sandals.  If you use a stepladder:  Make sure that it is fully opened. Do not climb a closed stepladder.  Make sure that both sides of the stepladder are locked into place.  Ask someone to hold it for you, if possible.  Clearly mark and make sure that you can see:  Any grab bars or handrails.  First and last steps.  Where the edge of each step is.  Use tools that help you move around (mobility aids) if they are needed. These include:  Canes.  Walkers.  Scooters.  Crutches.  Turn on the lights when you go into a dark area. Replace any light bulbs as soon as they burn out.  Set up your furniture so you have a clear path. Avoid moving your furniture around.  If any of your floors are uneven, fix them.  If there are any pets around you, be aware of where they are.  Review your medicines with your doctor. Some medicines can make you feel dizzy. This can increase your chance of falling. Ask your doctor what other things that you can do to help prevent falls. This information is not intended to replace advice given to you by your health care provider. Make sure you discuss any questions you have with your health care provider. Document Released: 09/11/2009 Document Revised: 04/22/2016 Document Reviewed: 12/20/2014 Elsevier Interactive Patient Education  2017 [employer].

## 2019-07-18 ENCOUNTER — Ambulatory Visit: Payer: Medicare Other | Admitting: Family Medicine

## 2019-07-19 ENCOUNTER — Ambulatory Visit (INDEPENDENT_AMBULATORY_CARE_PROVIDER_SITE_OTHER): Payer: Medicare Other | Admitting: Family Medicine

## 2019-07-19 ENCOUNTER — Encounter: Payer: Self-pay | Admitting: Family Medicine

## 2019-07-19 ENCOUNTER — Other Ambulatory Visit: Payer: Self-pay

## 2019-07-19 VITALS — BP 136/74 | HR 77 | Temp 97.6°F | Resp 16 | Wt 225.4 lb

## 2019-07-19 DIAGNOSIS — R269 Unspecified abnormalities of gait and mobility: Secondary | ICD-10-CM

## 2019-07-19 DIAGNOSIS — F3341 Major depressive disorder, recurrent, in partial remission: Secondary | ICD-10-CM | POA: Diagnosis not present

## 2019-07-19 DIAGNOSIS — F4312 Post-traumatic stress disorder, chronic: Secondary | ICD-10-CM

## 2019-07-19 DIAGNOSIS — I480 Paroxysmal atrial fibrillation: Secondary | ICD-10-CM | POA: Diagnosis not present

## 2019-07-19 DIAGNOSIS — I5032 Chronic diastolic (congestive) heart failure: Secondary | ICD-10-CM | POA: Diagnosis not present

## 2019-07-19 DIAGNOSIS — I2510 Atherosclerotic heart disease of native coronary artery without angina pectoris: Secondary | ICD-10-CM | POA: Diagnosis not present

## 2019-07-19 DIAGNOSIS — E119 Type 2 diabetes mellitus without complications: Secondary | ICD-10-CM

## 2019-07-19 DIAGNOSIS — Z794 Long term (current) use of insulin: Secondary | ICD-10-CM

## 2019-07-19 DIAGNOSIS — G4733 Obstructive sleep apnea (adult) (pediatric): Secondary | ICD-10-CM | POA: Diagnosis not present

## 2019-07-19 DIAGNOSIS — I10 Essential (primary) hypertension: Secondary | ICD-10-CM | POA: Diagnosis not present

## 2019-07-19 DIAGNOSIS — Z9182 Personal history of military deployment: Secondary | ICD-10-CM

## 2019-07-19 DIAGNOSIS — I25810 Atherosclerosis of coronary artery bypass graft(s) without angina pectoris: Secondary | ICD-10-CM | POA: Diagnosis not present

## 2019-07-19 LAB — POCT GLYCOSYLATED HEMOGLOBIN (HGB A1C): Hemoglobin A1C: 8.6 % — AB (ref 4.0–5.6)

## 2019-07-19 NOTE — Patient Instructions (Signed)
Stop Niacin Try using CPAP again

## 2019-07-19 NOTE — Progress Notes (Signed)
Patient: Michael Lucas Male    DOB: [birth year]   73 y.o.   MRN: [id number] Visit Date: 07/19/2019  Today's Provider: Wilhemena Durie, MD   Chief Complaint  Patient presents with  . Diabetes  . Follow-up   Subjective:     HPI  Diabetes follow up. Patient is no longer taking Lantus only Novolog. Physician at the [facility] made medication changes. Patient states that he fell on Monday. His feet got twisted in a towel on the floor and fell. He injured his knees. They are both swollen with a small gash on the left.  It is an abrasion that is healing. He is tired during the day--has h/o OSA--not on CPAP. Overall fairly stable.  No Known Allergies   Current Outpatient Medications:  .  atorvastatin (LIPITOR) 80 MG tablet, Take 80 mg by mouth every evening. , Disp: , Rfl:  .  cholecalciferol (VITAMIN D) 1000 units tablet, Take 1,000 Units by mouth daily., Disp: , Rfl:  .  docusate sodium (COLACE) 50 MG capsule, Take 200 mg by mouth daily. , Disp: , Rfl:  .  Emollient (EUCERIN PLUS) 2.5-10 % CREA, Apply 1 application topically daily as needed (dry skin). , Disp: , Rfl:  .  feeding supplement, GLUCERNA SHAKE, (GLUCERNA SHAKE) LIQD, Take 237 mLs by mouth daily. , Disp: , Rfl:  .  furosemide (LASIX) 20 MG tablet, Take 20 mg by mouth daily. As needed, Disp: , Rfl:  .  insulin aspart (NOVOLOG) 100 UNIT/ML injection, Inject 18 Units into the skin 3 (three) times daily with meals. , Disp: , Rfl:  .  ketoconazole (NIZORAL) 2 % shampoo, Apply 1 application topically See admin instructions. Apply shampoo to head every other day, Disp: , Rfl:  .  levothyroxine (SYNTHROID, LEVOTHROID) 50 MCG tablet, Take 50 mcg by mouth daily before breakfast. , Disp: , Rfl:  .  loratadine (ALLERGY RELIEF) 10 MG tablet, Take 10 mg by mouth daily as needed for allergies., Disp: , Rfl:  .  metFORMIN (GLUCOPHAGE) 500 MG tablet, Take 500 mg by mouth daily with breakfast. , Disp: , Rfl:  .  methadone (DOLOPHINE) 10 MG  tablet, Take 10 mg by mouth 2 (two) times daily. , Disp: , Rfl:  .  metoprolol (LOPRESSOR) 100 MG tablet, Take 1 tablet (100 mg total) by mouth 2 (two) times daily., Disp: 60 tablet, Rfl: 6 .  mometasone (ELOCON) 0.1 % cream, Apply 1 application topically daily., Disp: 45 g, Rfl: 0 .  niacin (NIASPAN) 500 MG CR tablet, Take 2 tablets by mouth daily at 12 noon. , Disp: , Rfl:  .  prazosin (MINIPRESS) 2 MG capsule, Take 4 mg by mouth at bedtime. , Disp: , Rfl:  .  pregabalin (LYRICA) 150 MG capsule, Take 1 capsule (150 mg total) by mouth 2 (two) times daily., Disp: 30 capsule, Rfl: 2 .  QUEtiapine (SEROQUEL) 100 MG tablet, Take 50 mg by mouth at bedtime., Disp: , Rfl:  .  rivaroxaban (XARELTO) 20 MG TABS tablet, Take 1 tablet (20 mg total) by mouth daily with supper., Disp: 30 tablet, Rfl: 6 .  sertraline (ZOLOFT) 100 MG tablet, Take 1 tablet (100 mg total) by mouth daily., Disp: 30 tablet, Rfl: 11 .  tacrolimus (PROTOPIC) 0.03 % ointment, Apply 1 application topically every other day. Reported on 04/01/2016, Disp: , Rfl:  .  insulin glargine (LANTUS) 100 UNIT/ML injection, Inject 44 Units into the skin at bedtime. , Disp: , Rfl:  Review of Systems  Constitutional: Positive for fatigue.  HENT: Negative.   Eyes: Negative.   Respiratory: Negative.   Cardiovascular: Negative.   Gastrointestinal: Negative.   Endocrine: Negative.   Musculoskeletal: Positive for arthralgias.  Allergic/Immunologic: Negative.   Hematological: Negative.   Psychiatric/Behavioral:       Presently stable.  All other systems reviewed and are negative.   Social History   Tobacco Use  . Smoking status: Former Smoker    Types: Cigarettes    Quit date: 1987    Years since quitting: 33.6  . Smokeless tobacco: Never Used  Substance Use Topics  . Alcohol use: No      Objective:   BP 136/74 (BP Location: Left Arm, Patient Position: Sitting, Cuff Size: Large)   Pulse 77   Temp 97.6 F (36.4 C) (Temporal)   Resp  16   Wt 225 lb 6.4 oz (102.2 kg)   SpO2 93%   BMI 34.27 kg/m  Vitals:   07/19/19 1555  BP: 136/74  Pulse: 77  Resp: 16  Temp: 97.6 F (36.4 C)  TempSrc: Temporal  SpO2: 93%  Weight: 225 lb 6.4 oz (102.2 kg)     Physical Exam Vitals signs reviewed.  Constitutional:      Appearance: He is well-developed.  HENT:     Head: Normocephalic and atraumatic.     Right Ear: External ear normal.     Left Ear: External ear normal.     Nose: Nose normal.  Eyes:     General: No scleral icterus.    Conjunctiva/sclera: Conjunctivae normal.  Neck:     Thyroid: No thyromegaly.  Cardiovascular:     Rate and Rhythm: Normal rate and regular rhythm.     Heart sounds: Normal heart sounds.  Pulmonary:     Effort: Pulmonary effort is normal.     Breath sounds: Normal breath sounds.  Abdominal:     Palpations: Abdomen is soft.  Musculoskeletal:     Comments: 1+ edema.  Lymphadenopathy:     Cervical: No cervical adenopathy.  Skin:    General: Skin is warm and dry.     Comments: She has a diabetic foot ulcer at the first MTP joint.  Callus with ulceration in the middle.  No signs of  infection presently  Neurological:     Mental Status: He is alert and oriented to person, place, and time. Mental status is at baseline.     Comments: Cannot feel monofilament foot exam.  Psychiatric:        Behavior: Behavior normal.        Thought Content: Thought content normal.        Judgment: Judgment normal.      Results for orders placed or performed in visit on 07/19/19  POCT HgB A1C  Result Value Ref Range   Hemoglobin A1C 8.6 (A) 4.0 - 5.6 %   HbA1c POC (<> result, manual entry)     HbA1c, POC (prediabetic range)     HbA1c, POC (controlled diabetic range)         Assessment & Plan    1. Type 2 diabetes mellitus treated with insulin (HCC) 8.6 today--was 8.9 On insulin./metformin.Consider ARB.More than 50% 25 minute visit spent in counseling or coordination of care  - POCT HgB A1C   2. Paroxysmal atrial fibrillation (HCC) On xarelto  3. Essential hypertension, benign On lipitor.  4. DIASTOLIC HEART FAILURE, CHRONIC   5. Coronary artery disease involving native coronary artery of native heart  without angina pectoris   6. UNSTEADY GAIT   7. Atherosclerosis of coronary artery bypass graft of native heart without angina pectoris   8. Recurrent major depressive disorder, in partial remission ([place]) [facility]  9. OSA (obstructive sleep apnea) Consider CPAP again.  10. Chronic post-traumatic stress disorder (PTSD) after [employer] combat Per [facility]. 11.HLD     Wilhemena Durie, MD  [facility]

## 2019-08-07 ENCOUNTER — Encounter: Payer: Self-pay | Admitting: *Deleted

## 2019-08-07 ENCOUNTER — Encounter: Payer: Self-pay | Admitting: Cardiovascular Disease

## 2019-08-07 ENCOUNTER — Telehealth: Payer: Self-pay

## 2019-08-07 ENCOUNTER — Ambulatory Visit (INDEPENDENT_AMBULATORY_CARE_PROVIDER_SITE_OTHER): Payer: Medicare Other | Admitting: Cardiovascular Disease

## 2019-08-07 ENCOUNTER — Other Ambulatory Visit: Payer: Self-pay

## 2019-08-07 VITALS — BP 132/80 | HR 84 | Ht 68.0 in | Wt 222.0 lb

## 2019-08-07 DIAGNOSIS — E7849 Other hyperlipidemia: Secondary | ICD-10-CM | POA: Diagnosis not present

## 2019-08-07 DIAGNOSIS — I5032 Chronic diastolic (congestive) heart failure: Secondary | ICD-10-CM

## 2019-08-07 DIAGNOSIS — I2510 Atherosclerotic heart disease of native coronary artery without angina pectoris: Secondary | ICD-10-CM | POA: Diagnosis not present

## 2019-08-07 DIAGNOSIS — I10 Essential (primary) hypertension: Secondary | ICD-10-CM

## 2019-08-07 DIAGNOSIS — I482 Chronic atrial fibrillation, unspecified: Secondary | ICD-10-CM

## 2019-08-07 DIAGNOSIS — I25810 Atherosclerosis of coronary artery bypass graft(s) without angina pectoris: Secondary | ICD-10-CM

## 2019-08-07 NOTE — Patient Instructions (Signed)
Medication Instructions:  Your physician recommends that you continue on your current medications as directed. Please refer to the Current Medication list given to you today.  If you need a refill on your cardiac medications before your next appointment, please call your pharmacy.   Lab work: None ordered If you have labs (blood work) drawn today and your tests are completely normal, you will receive your results only by: . MyChart Message (if you have MyChart) OR . A paper copy in the mail If you have any lab test that is abnormal or we need to change your treatment, we will call you to review the results.  Testing/Procedures: None ordered  Follow-Up: At [facility], you and your health needs are our priority.  As part of our continuing mission to provide you with exceptional heart care, we have created designated Provider Care Teams.  These Care Teams include your primary Cardiologist (physician) and Advanced Practice Providers (APPs -  Physician Assistants and Nurse Practitioners) who all work together to provide you with the care you need, when you need it. You will need a follow up appointment in 12 months.  Please call our office 2 months in advance to schedule this appointment.  You may see  Dr. Arida or one of the following Advanced Practice Providers on your designated Care Team:   Christopher Berge, NP Ryan Dunn, PA-C . Jacquelyn Visser, PA-C  Any Other Special Instructions Will Be Listed Below (If Applicable). N/A   

## 2019-08-07 NOTE — Telephone Encounter (Signed)
Patient left the office after his appt with Dr. Fletcher Anon before his AVS could be given. AVS mailed to the patient.

## 2019-08-07 NOTE — Progress Notes (Signed)
Cardiology Office Note   Date:  08/07/2019   ID:  Michael Lucas, DOB [birth year], MRN [id number]  PCP:  Jerrol Banana., MD  Cardiologist:   Kathlyn Sacramento, MD   Chief Complaint  Patient presents with  . other    over due f/u. Sob with exertion. no CP, no swelling.Medications reviewed verbally.       History of Present Illness: Michael Lucas is a 73 y.o. male who presents for a follow up visit regarding CAD and chronic atrial fibrillation .  He has history of CAD s/p CABG 1995, atrial fib/flutter s/p atrial flutter ablation in 2010, HTN, gait instability/peripheral neuropathy, depression and chronic diastolic CHF.   Most recent echocardiogram in June 2017 showed normal LV systolic function with an EF of 55-60% with mild mitral regurgitation, mildly dilated left atrium and mild pulmonary hypertension. Previous carotid Doppler in 2018 showed mild nonobstructive bilateral disease (less than 40%). He does have sleep apnea but currently does not have CPAP machine.   He has been doing well has not been seen here in more than 3 years.  He denies any chest pain or worsening dyspnea.  He does complain of episodes of falling asleep suddenly during the day.  He still does not have a CPAP machine yet.  No side effects with anticoagulation.   Past Medical History:  Diagnosis Date  . Anemia   . Ankle fracture, left   . Anxiety   . Arthritis   . Atrial flutter ([place])    a. 12/2008 s/p RFCA. Holter monitor 9/20 showed predominantly NSR with some short runs of atrial fibrillation. Coumadin stopped 7/11 due to frequent falls.  . Carotid arterial disease ([place])    a. 11/2011 U/S: 0-39% bilat ICA stenosis;  b. 03/2013 Carotid U/S: 0-39% bilat ICA stenosis.  . Cervical disc disease   . Chronic back pain   . Chronic diastolic CHF (congestive heart failure) ([place])    a. 07/2009 Echo: EF 50-55%, mild LVH, grade I DD, mild MR; b. 04/2015 Echo: Ef 65-70%, triv AI.  [place] Kitchen Chronic neck pain   . CKD  (chronic kidney disease), stage III ([place])    stage III  . Coronary artery disease    a. 1995 s/p CABG x 1 (LIMA->LAD);  b. 04/2001 Cath: LAD 100, patent LIMA->LAD, otw nonobs LCX/RCA dzs.  . Depression   . Dermatitis   . Diabetes mellitus   . Dyspnea   . Dysrhythmia   . Edema    FEET/LEGS  . Fractured fibula   . Heart murmur   . Hypertensive heart disease   . Hypothyroidism   . Nephrolithiasis    h/o  . Orthopnea    2-3 PILLOWS  . OSA (obstructive sleep apnea)    a. Unable to tolerate CPAP because of allergic reaction to straps of face mask.  . Paroxysmal atrial fibrillation ([place])    a. 07/2009 PAF noted on holter; b. 05/2010 Coumadin d/c'd 2/2 falls; c. 02/2015 CHA2DS2VASc = 5-->Xarelto.  . Peripheral neuropathy    suspect diabetes related. has led to gait instability. seen by [place] neurology, head MRI showed only mild diffuse atrophy with moderate peri-sylvan atrophy.  [place] Kitchen PTSD (post-traumatic stress disorder)    takes prazosin for this.  . Pulmonary embolism ([place]) 2010  . Stroke [facility])    POSSIBLE TIA  . Wears dentures    full upper and lower (only wears upper)    Past Surgical History:  Procedure Laterality Date  . ABLATION  CARDIAC  . CATARACT EXTRACTION W/PHACO Right 11/04/2016   Procedure: CATARACT EXTRACTION PHACO AND INTRAOCULAR LENS PLACEMENT (IOC);  Surgeon: Eulogio Bear, MD;  Location: [facility] ORS;  Service: Ophthalmology;  Laterality: Right;  [place] 53.7 AP% 12.6 CDE 6.75 Fluid pack lot # [id number]  . CATARACT EXTRACTION W/PHACO Left 12/02/2016   Procedure: CATARACT EXTRACTION PHACO AND INTRAOCULAR LENS PLACEMENT (IOC);  Surgeon: Eulogio Bear, MD;  Location: [facility] ORS;  Service: Ophthalmology;  Laterality: Left;  [place] 43.5 AP% 12.5 CDE 5.48 Fluid Pack lot # [id number]  . COLONOSCOPY WITH PROPOFOL N/A 08/26/2017   Procedure: COLONOSCOPY WITH PROPOFOL;  Surgeon: Lucilla Lame, MD;  Location: [place];  Service: Gastroenterology;  Laterality: N/A;   Diabetic - insulin  . CORONARY ARTERY BYPASS GRAFT  1995  . HEMORRHOID SURGERY    . HERNIA REPAIR    . POLYPECTOMY  08/26/2017   Procedure: POLYPECTOMY;  Surgeon: Lucilla Lame, MD;  Location: [place];  Service: Gastroenterology;;  . [place]    . ROTATOR CUFF REPAIR       Current Outpatient Medications  Medication Sig Dispense Refill  . atorvastatin (LIPITOR) 80 MG tablet Take 80 mg by mouth every evening.     . cholecalciferol (VITAMIN D) 1000 units tablet Take 1,000 Units by mouth daily.    [place] Kitchen docusate sodium (COLACE) 50 MG capsule Take 200 mg by mouth daily.     . Emollient (EUCERIN PLUS) 2.5-10 % CREA Apply 1 application topically daily as needed (dry skin).     . feeding supplement, GLUCERNA SHAKE, (GLUCERNA SHAKE) LIQD Take 237 mLs by mouth daily.     . furosemide (LASIX) 20 MG tablet Take 20 mg by mouth daily. As needed    . insulin aspart (NOVOLOG) 100 UNIT/ML injection Inject 18 Units into the skin 3 (three) times daily with meals.     . insulin glargine (LANTUS) 100 UNIT/ML injection Inject 44 Units into the skin at bedtime.     [place] Kitchen ketoconazole (NIZORAL) 2 % shampoo Apply 1 application topically See admin instructions. Apply shampoo to head every other day    . levothyroxine (SYNTHROID, LEVOTHROID) 50 MCG tablet Take 50 mcg by mouth daily before breakfast.     . loratadine (ALLERGY RELIEF) 10 MG tablet Take 10 mg by mouth daily as needed for allergies.    . metFORMIN (GLUCOPHAGE) 500 MG tablet Take 500 mg by mouth daily with breakfast.     . methadone (DOLOPHINE) 10 MG tablet Take 10 mg by mouth 2 (two) times daily.     . metoprolol (LOPRESSOR) 100 MG tablet Take 1 tablet (100 mg total) by mouth 2 (two) times daily. 60 tablet 6  . mometasone (ELOCON) 0.1 % cream Apply 1 application topically daily. 45 g 0  . prazosin (MINIPRESS) 2 MG capsule Take 4 mg by mouth at bedtime.     . pregabalin (LYRICA) 150 MG capsule Take 1 capsule (150 mg total) by mouth 2 (two)  times daily. 30 capsule 2  . QUEtiapine (SEROQUEL) 100 MG tablet Take 50 mg by mouth at bedtime.    . rivaroxaban (XARELTO) 20 MG TABS tablet Take 1 tablet (20 mg total) by mouth daily with supper. 30 tablet 6  . sertraline (ZOLOFT) 100 MG tablet Take 1 tablet (100 mg total) by mouth daily. 30 tablet 11  . tacrolimus (PROTOPIC) 0.03 % ointment Apply 1 application topically every other day. Reported on 04/01/2016     No current facility-administered medications  for this visit.     Allergies:   Patient has no known allergies.    Social History:  The patient  reports that he quit smoking about 33 years ago. His smoking use included cigarettes. He has never used smokeless tobacco. He reports that he does not drink alcohol or use drugs.   Family History:  The patient's family history includes Alcohol abuse in his sister; Cancer in his mother, sister, and sister; Heart disease in his father.    ROS:  Please see the history of present illness.   Otherwise, review of systems are positive for none.   All other systems are reviewed and negative.    PHYSICAL EXAM: VS:  BP 132/80 (BP Location: Left Arm, Patient Position: Sitting, Cuff Size: Normal)   Pulse 84   Ht 5\' 8"  (1.727 m)   Wt 222 lb (100.7 kg)   SpO2 93%   BMI 33.75 kg/m  , BMI Body mass index is 33.75 kg/m. GEN: Well nourished, well developed, in no acute distress  HEENT: normal  Neck: no JVD, carotid bruits, or masses Cardiac: Irregularly irregular; no murmurs, rubs, or gallops, trace edema  Respiratory:  clear to auscultation bilaterally, normal work of breathing GI: soft, nontender, nondistended, + BS MS: no deformity or atrophy  Skin: warm and dry, no rash Neuro:  Strength and sensation are intact Psych: euthymic mood, full affect   EKG:  EKG is ordered today. EKG showed atrial fibrillation with no significant ST or T wave changes.   Recent Labs: 10/05/2018: TSH 5.980 02/04/2019: ALT 14; BUN 20; Creatinine, Ser 1.26;  Hemoglobin 13.1; Platelets 216; Potassium 3.9; Sodium 136    Lipid Panel    Component Value Date/Time   CHOL 111 10/05/2018 0901   TRIG 137 10/05/2018 0901   HDL 41 10/05/2018 0901   CHOLHDL 2.7 10/05/2018 0901   CHOLHDL 2.9 11/02/2011 0858   VLDL 27 11/02/2011 0858   LDLCALC 43 10/05/2018 0901      Wt Readings from Last 3 Encounters:  08/07/19 222 lb (100.7 kg)  07/19/19 225 lb 6.4 oz (102.2 kg)  02/04/19 215 lb (97.5 kg)        ASSESSMENT AND PLAN:   1.  Chronic atrial fibrillation: Ventricular rate is controlled on current dose of metoprolol 100 mg twice daily.  He is tolerating anticoagulation with Xarelto with no side effects.  I reviewed most recent labs done in March which showed mild chronic kidney disease with a creatinine of 1.26.  GFR still above 50 and thus no need to adjust the dose of Xarelto.  CBC showed no evidence of anemia.  2. Coronary artery disease: No convincing symptoms of angina.  Continue medical therapy.  3. Essential hypertension: Blood pressure is controlled on current medications.  4. Hyperlipidemia: continue treatment with atorvastatin. Most recent lipid profile showed an LDL of 43.   5. Chronic diastolic heart failure: He seems to be euvolemic.  He has not used furosemide in a long time.   Disposition:   FU with me in 12 months  Signed,  Kathlyn Sacramento, MD  08/07/2019 3:10 PM    [place]

## 2019-08-23 ENCOUNTER — Encounter: Payer: Self-pay | Admitting: Family Medicine

## 2019-08-23 DIAGNOSIS — E119 Type 2 diabetes mellitus without complications: Secondary | ICD-10-CM | POA: Diagnosis not present

## 2019-08-23 LAB — HM DIABETES EYE EXAM

## 2019-08-27 NOTE — Progress Notes (Signed)
error 

## 2019-09-19 DIAGNOSIS — H26491 Other secondary cataract, right eye: Secondary | ICD-10-CM | POA: Diagnosis not present

## 2019-09-26 DIAGNOSIS — B351 Tinea unguium: Secondary | ICD-10-CM | POA: Diagnosis not present

## 2019-09-26 DIAGNOSIS — E1140 Type 2 diabetes mellitus with diabetic neuropathy, unspecified: Secondary | ICD-10-CM | POA: Diagnosis not present

## 2019-09-26 DIAGNOSIS — Z794 Long term (current) use of insulin: Secondary | ICD-10-CM | POA: Diagnosis not present

## 2019-09-26 DIAGNOSIS — L851 Acquired keratosis [keratoderma] palmaris et plantaris: Secondary | ICD-10-CM | POA: Diagnosis not present

## 2019-10-31 DIAGNOSIS — H26492 Other secondary cataract, left eye: Secondary | ICD-10-CM | POA: Diagnosis not present

## 2019-11-01 ENCOUNTER — Other Ambulatory Visit: Payer: Self-pay | Admitting: *Deleted

## 2019-11-01 DIAGNOSIS — Z0283 Encounter for blood-alcohol and blood-drug test: Secondary | ICD-10-CM

## 2019-11-01 DIAGNOSIS — E785 Hyperlipidemia, unspecified: Secondary | ICD-10-CM

## 2019-11-01 DIAGNOSIS — E119 Type 2 diabetes mellitus without complications: Secondary | ICD-10-CM

## 2019-11-01 DIAGNOSIS — N183 Chronic kidney disease, stage 3 unspecified: Secondary | ICD-10-CM

## 2019-11-01 DIAGNOSIS — I10 Essential (primary) hypertension: Secondary | ICD-10-CM

## 2019-11-01 DIAGNOSIS — Z794 Long term (current) use of insulin: Secondary | ICD-10-CM

## 2019-12-04 ENCOUNTER — Ambulatory Visit: Payer: Self-pay | Admitting: *Deleted

## 2019-12-04 ENCOUNTER — Encounter: Payer: Self-pay | Admitting: Emergency Medicine

## 2019-12-04 ENCOUNTER — Emergency Department: Payer: Medicare Other

## 2019-12-04 ENCOUNTER — Telehealth: Payer: Self-pay

## 2019-12-04 ENCOUNTER — Inpatient Hospital Stay: Payer: Medicare Other

## 2019-12-04 ENCOUNTER — Other Ambulatory Visit: Payer: Self-pay

## 2019-12-04 DIAGNOSIS — R0902 Hypoxemia: Secondary | ICD-10-CM

## 2019-12-04 DIAGNOSIS — J9621 Acute and chronic respiratory failure with hypoxia: Secondary | ICD-10-CM | POA: Diagnosis present

## 2019-12-04 DIAGNOSIS — J849 Interstitial pulmonary disease, unspecified: Secondary | ICD-10-CM | POA: Diagnosis not present

## 2019-12-04 DIAGNOSIS — J8410 Pulmonary fibrosis, unspecified: Secondary | ICD-10-CM | POA: Diagnosis present

## 2019-12-04 DIAGNOSIS — I482 Chronic atrial fibrillation, unspecified: Secondary | ICD-10-CM | POA: Diagnosis not present

## 2019-12-04 DIAGNOSIS — J810 Acute pulmonary edema: Secondary | ICD-10-CM | POA: Diagnosis not present

## 2019-12-04 DIAGNOSIS — N179 Acute kidney failure, unspecified: Secondary | ICD-10-CM | POA: Diagnosis present

## 2019-12-04 DIAGNOSIS — I2510 Atherosclerotic heart disease of native coronary artery without angina pectoris: Secondary | ICD-10-CM | POA: Diagnosis present

## 2019-12-04 DIAGNOSIS — W1811XA Fall from or off toilet without subsequent striking against object, initial encounter: Secondary | ICD-10-CM | POA: Diagnosis not present

## 2019-12-04 DIAGNOSIS — J129 Viral pneumonia, unspecified: Secondary | ICD-10-CM | POA: Diagnosis present

## 2019-12-04 DIAGNOSIS — I4891 Unspecified atrial fibrillation: Secondary | ICD-10-CM | POA: Diagnosis present

## 2019-12-04 DIAGNOSIS — Z8673 Personal history of transient ischemic attack (TIA), and cerebral infarction without residual deficits: Secondary | ICD-10-CM | POA: Diagnosis not present

## 2019-12-04 DIAGNOSIS — Z794 Long term (current) use of insulin: Secondary | ICD-10-CM | POA: Diagnosis not present

## 2019-12-04 DIAGNOSIS — E039 Hypothyroidism, unspecified: Secondary | ICD-10-CM | POA: Diagnosis present

## 2019-12-04 DIAGNOSIS — R0602 Shortness of breath: Secondary | ICD-10-CM | POA: Diagnosis not present

## 2019-12-04 DIAGNOSIS — J9601 Acute respiratory failure with hypoxia: Secondary | ICD-10-CM | POA: Diagnosis not present

## 2019-12-04 DIAGNOSIS — Y92230 Patient room in hospital as the place of occurrence of the external cause: Secondary | ICD-10-CM | POA: Diagnosis not present

## 2019-12-04 DIAGNOSIS — I5033 Acute on chronic diastolic (congestive) heart failure: Secondary | ICD-10-CM | POA: Diagnosis not present

## 2019-12-04 DIAGNOSIS — Z87891 Personal history of nicotine dependence: Secondary | ICD-10-CM | POA: Diagnosis not present

## 2019-12-04 DIAGNOSIS — E1122 Type 2 diabetes mellitus with diabetic chronic kidney disease: Secondary | ICD-10-CM | POA: Diagnosis present

## 2019-12-04 DIAGNOSIS — I272 Pulmonary hypertension, unspecified: Secondary | ICD-10-CM | POA: Diagnosis present

## 2019-12-04 DIAGNOSIS — Z79890 Hormone replacement therapy (postmenopausal): Secondary | ICD-10-CM

## 2019-12-04 DIAGNOSIS — E785 Hyperlipidemia, unspecified: Secondary | ICD-10-CM | POA: Diagnosis not present

## 2019-12-04 DIAGNOSIS — G9340 Encephalopathy, unspecified: Secondary | ICD-10-CM | POA: Diagnosis not present

## 2019-12-04 DIAGNOSIS — I255 Ischemic cardiomyopathy: Secondary | ICD-10-CM | POA: Diagnosis present

## 2019-12-04 DIAGNOSIS — I4892 Unspecified atrial flutter: Secondary | ICD-10-CM | POA: Diagnosis present

## 2019-12-04 DIAGNOSIS — J440 Chronic obstructive pulmonary disease with acute lower respiratory infection: Secondary | ICD-10-CM | POA: Diagnosis present

## 2019-12-04 DIAGNOSIS — R0603 Acute respiratory distress: Secondary | ICD-10-CM | POA: Diagnosis not present

## 2019-12-04 DIAGNOSIS — I5043 Acute on chronic combined systolic (congestive) and diastolic (congestive) heart failure: Secondary | ICD-10-CM | POA: Diagnosis present

## 2019-12-04 DIAGNOSIS — Z79899 Other long term (current) drug therapy: Secondary | ICD-10-CM

## 2019-12-04 DIAGNOSIS — E1142 Type 2 diabetes mellitus with diabetic polyneuropathy: Secondary | ICD-10-CM | POA: Diagnosis not present

## 2019-12-04 DIAGNOSIS — R918 Other nonspecific abnormal finding of lung field: Secondary | ICD-10-CM | POA: Diagnosis not present

## 2019-12-04 DIAGNOSIS — G8929 Other chronic pain: Secondary | ICD-10-CM | POA: Diagnosis present

## 2019-12-04 DIAGNOSIS — Z951 Presence of aortocoronary bypass graft: Secondary | ICD-10-CM | POA: Diagnosis not present

## 2019-12-04 DIAGNOSIS — I6340 Cerebral infarction due to embolism of unspecified cerebral artery: Secondary | ICD-10-CM | POA: Diagnosis not present

## 2019-12-04 DIAGNOSIS — F4310 Post-traumatic stress disorder, unspecified: Secondary | ICD-10-CM | POA: Diagnosis present

## 2019-12-04 DIAGNOSIS — Z86718 Personal history of other venous thrombosis and embolism: Secondary | ICD-10-CM

## 2019-12-04 DIAGNOSIS — R9431 Abnormal electrocardiogram [ECG] [EKG]: Secondary | ICD-10-CM | POA: Diagnosis not present

## 2019-12-04 DIAGNOSIS — I340 Nonrheumatic mitral (valve) insufficiency: Secondary | ICD-10-CM | POA: Diagnosis not present

## 2019-12-04 DIAGNOSIS — J9622 Acute and chronic respiratory failure with hypercapnia: Secondary | ICD-10-CM | POA: Diagnosis present

## 2019-12-04 DIAGNOSIS — I25118 Atherosclerotic heart disease of native coronary artery with other forms of angina pectoris: Secondary | ICD-10-CM | POA: Diagnosis not present

## 2019-12-04 DIAGNOSIS — J441 Chronic obstructive pulmonary disease with (acute) exacerbation: Secondary | ICD-10-CM | POA: Diagnosis present

## 2019-12-04 DIAGNOSIS — F329 Major depressive disorder, single episode, unspecified: Secondary | ICD-10-CM | POA: Diagnosis present

## 2019-12-04 DIAGNOSIS — R079 Chest pain, unspecified: Secondary | ICD-10-CM | POA: Diagnosis not present

## 2019-12-04 DIAGNOSIS — I130 Hypertensive heart and chronic kidney disease with heart failure and stage 1 through stage 4 chronic kidney disease, or unspecified chronic kidney disease: Secondary | ICD-10-CM | POA: Diagnosis present

## 2019-12-04 DIAGNOSIS — R519 Headache, unspecified: Secondary | ICD-10-CM | POA: Diagnosis not present

## 2019-12-04 DIAGNOSIS — E11649 Type 2 diabetes mellitus with hypoglycemia without coma: Secondary | ICD-10-CM | POA: Diagnosis not present

## 2019-12-04 DIAGNOSIS — Z20822 Contact with and (suspected) exposure to covid-19: Secondary | ICD-10-CM | POA: Diagnosis present

## 2019-12-04 DIAGNOSIS — I4819 Other persistent atrial fibrillation: Secondary | ICD-10-CM | POA: Diagnosis not present

## 2019-12-04 DIAGNOSIS — I214 Non-ST elevation (NSTEMI) myocardial infarction: Secondary | ICD-10-CM | POA: Diagnosis not present

## 2019-12-04 DIAGNOSIS — Z66 Do not resuscitate: Secondary | ICD-10-CM | POA: Diagnosis not present

## 2019-12-04 DIAGNOSIS — G9341 Metabolic encephalopathy: Secondary | ICD-10-CM | POA: Diagnosis present

## 2019-12-04 DIAGNOSIS — Z7901 Long term (current) use of anticoagulants: Secondary | ICD-10-CM

## 2019-12-04 DIAGNOSIS — N1831 Chronic kidney disease, stage 3a: Secondary | ICD-10-CM | POA: Diagnosis present

## 2019-12-04 DIAGNOSIS — I63413 Cerebral infarction due to embolism of bilateral middle cerebral arteries: Secondary | ICD-10-CM | POA: Diagnosis not present

## 2019-12-04 DIAGNOSIS — J9600 Acute respiratory failure, unspecified whether with hypoxia or hypercapnia: Secondary | ICD-10-CM | POA: Diagnosis not present

## 2019-12-04 DIAGNOSIS — Z79891 Long term (current) use of opiate analgesic: Secondary | ICD-10-CM

## 2019-12-04 DIAGNOSIS — G4733 Obstructive sleep apnea (adult) (pediatric): Secondary | ICD-10-CM | POA: Diagnosis present

## 2019-12-04 DIAGNOSIS — Z515 Encounter for palliative care: Secondary | ICD-10-CM | POA: Diagnosis not present

## 2019-12-04 DIAGNOSIS — I4821 Permanent atrial fibrillation: Secondary | ICD-10-CM | POA: Diagnosis present

## 2019-12-04 DIAGNOSIS — Z86711 Personal history of pulmonary embolism: Secondary | ICD-10-CM

## 2019-12-04 DIAGNOSIS — I509 Heart failure, unspecified: Secondary | ICD-10-CM | POA: Insufficient documentation

## 2019-12-04 DIAGNOSIS — J9620 Acute and chronic respiratory failure, unspecified whether with hypoxia or hypercapnia: Secondary | ICD-10-CM

## 2019-12-04 DIAGNOSIS — I4811 Longstanding persistent atrial fibrillation: Secondary | ICD-10-CM | POA: Diagnosis not present

## 2019-12-04 DIAGNOSIS — I639 Cerebral infarction, unspecified: Secondary | ICD-10-CM

## 2019-12-04 LAB — COMPREHENSIVE METABOLIC PANEL
ALT: 28 U/L (ref 0–44)
AST: 38 U/L (ref 15–41)
Albumin: 3.6 g/dL (ref 3.5–5.0)
Alkaline Phosphatase: 146 U/L — ABNORMAL HIGH (ref 38–126)
Anion gap: 14 (ref 5–15)
BUN: 18 mg/dL (ref 8–23)
CO2: 21 mmol/L — ABNORMAL LOW (ref 22–32)
Calcium: 8.8 mg/dL — ABNORMAL LOW (ref 8.9–10.3)
Chloride: 96 mmol/L — ABNORMAL LOW (ref 98–111)
Creatinine, Ser: 1.24 mg/dL (ref 0.61–1.24)
GFR calc Af Amer: >60 mL/min (ref >60)
GFR calc non Af Amer: 57 mL/min — ABNORMAL LOW (ref >60)
Glucose, Bld: 179 mg/dL — ABNORMAL HIGH (ref 70–99)
Potassium: 3.7 mmol/L (ref 3.5–5.1)
Sodium: 131 mmol/L — ABNORMAL LOW (ref 135–145)
Total Bilirubin: 0.9 mg/dL (ref 0.3–1.2)
Total Protein: 7.8 g/dL (ref 6.5–8.1)

## 2019-12-04 LAB — CBC WITH DIFFERENTIAL/PLATELET
Abs Immature Granulocytes: 0.06 10*3/uL (ref 0.00–0.07)
Basophils Absolute: 0 10*3/uL (ref 0.0–0.1)
Basophils Relative: 0 %
Eosinophils Absolute: 0.1 10*3/uL (ref 0.0–0.5)
Eosinophils Relative: 1 %
HCT: 36.2 % — ABNORMAL LOW (ref 39.0–52.0)
Hemoglobin: 11.5 g/dL — ABNORMAL LOW (ref 13.0–17.0)
Immature Granulocytes: 1 %
Lymphocytes Relative: 9 %
Lymphs Abs: 0.9 10*3/uL (ref 0.7–4.0)
MCH: 27.8 pg (ref 26.0–34.0)
MCHC: 31.8 g/dL (ref 30.0–36.0)
MCV: 87.7 fL (ref 80.0–100.0)
Monocytes Absolute: 0.7 10*3/uL (ref 0.1–1.0)
Monocytes Relative: 6 %
Neutro Abs: 8.9 10*3/uL — ABNORMAL HIGH (ref 1.7–7.7)
Neutrophils Relative %: 83 %
Platelets: 297 10*3/uL (ref 150–400)
RBC: 4.13 MIL/uL — ABNORMAL LOW (ref 4.22–5.81)
RDW: 13.3 % (ref 11.5–15.5)
WBC: 10.7 10*3/uL — ABNORMAL HIGH (ref 4.0–10.5)
nRBC: 0 % (ref 0.0–0.2)

## 2019-12-04 LAB — PROCALCITONIN: Procalcitonin: 0.21 ng/mL

## 2019-12-04 LAB — GLUCOSE, CAPILLARY
Glucose-Capillary: 196 mg/dL — ABNORMAL HIGH (ref 70–99)
Glucose-Capillary: 169 mg/dL — ABNORMAL HIGH (ref 70–99)

## 2019-12-04 LAB — RESPIRATORY PANEL BY RT PCR (FLU A&B, COVID)
Influenza A by PCR: NEGATIVE
Influenza B by PCR: NEGATIVE
SARS Coronavirus 2 by RT PCR: NEGATIVE

## 2019-12-04 LAB — POC SARS CORONAVIRUS 2 AG: SARS Coronavirus 2 Ag: NEGATIVE

## 2019-12-04 LAB — BRAIN NATRIURETIC PEPTIDE: B Natriuretic Peptide: 276 pg/mL — ABNORMAL HIGH (ref 0.0–100.0)

## 2019-12-04 LAB — LACTIC ACID, PLASMA: Lactic Acid, Venous: 1.2 mmol/L (ref 0.5–1.9)

## 2019-12-04 MED ORDER — SODIUM CHLORIDE 0.9 % IV SOLN: 250.0000 mL | INTRAVENOUS | Status: DC | PRN

## 2019-12-04 MED ORDER — ATORVASTATIN CALCIUM 20 MG PO TABS
80.0000 mg | ORAL_TABLET | Freq: Every evening | ORAL | Status: DC
  Administered 2019-12-04 – 2019-12-06 (×2): 80 mg via ORAL
  Filled 2019-12-04: qty 4
  Filled 2019-12-04 (×2): qty 1
  Filled 2019-12-04: qty 4

## 2019-12-04 MED ORDER — PREGABALIN 75 MG PO CAPS
150.0000 mg | ORAL_CAPSULE | Freq: Two times a day (BID) | ORAL | Status: DC
  Filled 2019-12-04: qty 2

## 2019-12-04 MED ORDER — SERTRALINE HCL 50 MG PO TABS
100.0000 mg | ORAL_TABLET | Freq: Every day | ORAL | Status: DC
  Administered 2019-12-04 – 2019-12-08 (×4): 100 mg via ORAL
  Filled 2019-12-04 (×5): qty 2

## 2019-12-04 MED ORDER — VITAMIN D 25 MCG (1000 UNIT) PO TABS
1000.0000 [IU] | ORAL_TABLET | Freq: Every day | ORAL | Status: DC
  Administered 2019-12-04 – 2019-12-08 (×4): 1000 [IU] via ORAL
  Filled 2019-12-04 (×4): qty 1

## 2019-12-04 MED ORDER — FUROSEMIDE 10 MG/ML IJ SOLN
40.0000 mg | Freq: Two times a day (BID) | INTRAMUSCULAR | Status: DC
  Administered 2019-12-04 – 2019-12-06 (×3): 40 mg via INTRAVENOUS
  Filled 2019-12-04 (×3): qty 4

## 2019-12-04 MED ORDER — INSULIN ASPART 100 UNIT/ML ~~LOC~~ SOLN
0.0000 [IU] | Freq: Three times a day (TID) | SUBCUTANEOUS | Status: DC
  Administered 2019-12-05: 5 [IU] via SUBCUTANEOUS
  Administered 2019-12-06: 2 [IU] via SUBCUTANEOUS
  Administered 2019-12-06: 9 [IU] via SUBCUTANEOUS
  Filled 2019-12-04 (×3): qty 1

## 2019-12-04 MED ORDER — QUETIAPINE FUMARATE 25 MG PO TABS
50.0000 mg | ORAL_TABLET | Freq: Every day | ORAL | Status: DC
  Administered 2019-12-05 – 2019-12-06 (×2): 50 mg via ORAL
  Filled 2019-12-04 (×2): qty 2

## 2019-12-04 MED ORDER — PRAZOSIN HCL 5 MG PO CAPS
6.0000 mg | ORAL_CAPSULE | Freq: Every day | ORAL | Status: DC
  Administered 2019-12-05 – 2019-12-06 (×2): 6 mg via ORAL
  Filled 2019-12-04 (×3): qty 1

## 2019-12-04 MED ORDER — FUROSEMIDE 10 MG/ML IJ SOLN
40.0000 mg | Freq: Once | INTRAMUSCULAR | Status: AC
  Administered 2019-12-04: 40 mg via INTRAVENOUS
  Filled 2019-12-04: qty 4

## 2019-12-04 MED ORDER — DEXAMETHASONE SODIUM PHOSPHATE 10 MG/ML IJ SOLN
8.0000 mg | Freq: Once | INTRAMUSCULAR | Status: AC
  Administered 2019-12-04: 8 mg via INTRAVENOUS
  Filled 2019-12-04: qty 1

## 2019-12-04 MED ORDER — METHADONE HCL 10 MG PO TABS
10.0000 mg | ORAL_TABLET | Freq: Two times a day (BID) | ORAL | Status: DC
  Administered 2019-12-06 – 2019-12-08 (×6): 10 mg via ORAL
  Filled 2019-12-04 (×10): qty 1

## 2019-12-04 MED ORDER — INSULIN GLARGINE 100 UNIT/ML ~~LOC~~ SOLN
46.0000 [IU] | Freq: Every day | SUBCUTANEOUS | Status: DC
  Administered 2019-12-04: 46 [IU] via SUBCUTANEOUS
  Filled 2019-12-04 (×2): qty 0.46

## 2019-12-04 MED ORDER — INFLUENZA VAC A&B SA ADJ QUAD 0.5 ML IM PRSY
0.5000 mL | PREFILLED_SYRINGE | INTRAMUSCULAR | Status: DC
  Filled 2019-12-04: qty 0.5

## 2019-12-04 MED ORDER — ONDANSETRON HCL 4 MG/2ML IJ SOLN: 4.0000 mg | Freq: Four times a day (QID) | INTRAMUSCULAR | Status: DC | PRN

## 2019-12-04 MED ORDER — ACETAMINOPHEN 325 MG PO TABS
650.0000 mg | ORAL_TABLET | ORAL | Status: DC | PRN
  Administered 2019-12-05 – 2019-12-06 (×2): 650 mg via ORAL
  Filled 2019-12-04 (×2): qty 2

## 2019-12-04 MED ORDER — SODIUM CHLORIDE 0.9% FLUSH
3.0000 mL | Freq: Two times a day (BID) | INTRAVENOUS | Status: DC
  Administered 2019-12-04 – 2019-12-09 (×10): 3 mL via INTRAVENOUS

## 2019-12-04 MED ORDER — METHADONE HCL 10 MG PO TABS: 10.0000 mg | ORAL_TABLET | Freq: Two times a day (BID) | ORAL | Status: DC

## 2019-12-04 MED ORDER — METOPROLOL TARTRATE 50 MG PO TABS
100.0000 mg | ORAL_TABLET | Freq: Two times a day (BID) | ORAL | Status: DC
  Administered 2019-12-05: 100 mg via ORAL
  Filled 2019-12-04 (×2): qty 2

## 2019-12-04 MED ORDER — IOHEXOL 350 MG/ML SOLN
75.0000 mL | Freq: Once | INTRAVENOUS | Status: AC | PRN
  Administered 2019-12-04: 75 mL via INTRAVENOUS

## 2019-12-04 MED ORDER — RIVAROXABAN 20 MG PO TABS
20.0000 mg | ORAL_TABLET | Freq: Every day | ORAL | Status: DC
  Administered 2019-12-04 – 2019-12-08 (×4): 20 mg via ORAL
  Filled 2019-12-04 (×7): qty 1

## 2019-12-04 MED ORDER — LEVOTHYROXINE SODIUM 50 MCG PO TABS
50.0000 ug | ORAL_TABLET | Freq: Every day | ORAL | Status: DC
  Administered 2019-12-05 – 2019-12-08 (×4): 50 ug via ORAL
  Filled 2019-12-04 (×4): qty 1

## 2019-12-04 MED ORDER — SODIUM CHLORIDE 0.9% FLUSH: 3.0000 mL | INTRAVENOUS | Status: DC | PRN

## 2019-12-04 NOTE — Telephone Encounter (Signed)
FYI, from [facility]

## 2019-12-04 NOTE — ED Notes (Signed)
Pt given meal tray with approval from Dr. Posey Pronto.

## 2019-12-04 NOTE — ED Triage Notes (Signed)
Pt via POV from home. Per daughter, pt O2 sat was in the 47s. Called PCP and was told to come to the hospital. On arrival, O2 sat in 70s RA. Hx of CHF. Placed in room and placed on 6L [place] O2 sat increased to 97%

## 2019-12-04 NOTE — Progress Notes (Addendum)
Pt stated he did take his own home dose of methadone (to get back on schedule) about an hour and a half prior to fall. NP notified. Pharmacy consult placed to reschedule drugs to resume home regimen. Pt educated on importance of not taking home meds while in hospital. Daughter instructed to take home meds with her.

## 2019-12-04 NOTE — Progress Notes (Addendum)
Nurse reports patient with fall hitting head onwall  Patient admitted for increasing shortness of breath at home and found to have hypoxia on presentation. Patient with  history of but not limited to heart failure, diabetes, chronic pain, "balance issues", GERD,  CAD, HTN, chronic pain and OSA (non compliant with CPAP recommended use)., cataract with recent lens implant surgery, hypothyroidism, PAF, DVT history, CKD, and carotid arttery stenosis. After fall neuro exam without deficit and head CT negative.  Secondary to some increased work of breathing after fall and previous reported edema vs infectious process on chest film, CT chest also obtained.   Also nurse reports patient admitted to taking his own methadone he had with him cause we did not have his mediaitions scheduled at times he takes at home.  Counseling, with his daughter present on necessity of not taking anything not given to home by his nurses is important in providing home with the safest most appropriate care. Daughter  Agreed to take meds home and pharmacy consult to have medications scheduled as he does at home.  IMPRESSION: No evidence of pulmonary emboli.  Patchy ground-glass infiltrates with interstitial thickening likely related to patchy edema. Patient has a negative COVID-19 test. Atypical pneumonia may also be superimposed as well.  Mediastinal and hilar adenopathy likely reactive given the appearance of the lungs  After receiving his home medications he takes for his anxiety/depression and chronic pain, he had episode of decreased oxygen saturations and hypotension.  Discussed his home regiment with him and have discontinued his lyrica as his polypharmacy , and timed schedule of meds may be contributing factor to current problems.  After albumin he was treated with lasix. His COVID and influenza tests are negative. It is possible with his history and imaging results he has COPD and may be having some exacerbation  contributing to current symptoms and increased oxygen requirement.  I have added oral steroids, azithromycin, and breathing treatments as well as CPAP therapy.  He has elevated troponin of 87 and repeat labs ordered to trend, Cardiology already on board and echo pending

## 2019-12-04 NOTE — ED Notes (Signed)
Pt unable to eat food given because he has no teeth pt in the process of getting denture. Changed diet order to a soft diet. Pt given applesauce and ice cream at this time.

## 2019-12-04 NOTE — Progress Notes (Signed)
Pt's daughter stated she was taking pt to bed side commode and turned away to give him privacy. Pt then fell to floor hitting his head against the wall. Pt c/o mild pain at the top of his head where he hit it. Small abrasions noted at the top of his head and his right hand. NP notified. Orders for stat CT of head and chest. NP evaluated the pt post fall at the bedside.

## 2019-12-04 NOTE — Telephone Encounter (Signed)
Patient has complained of difficulty breathing for a few weeks. Patient has been using O2 sat- 80's. Daughter is calling because he is not getting better- she is with him now. Patient states he feels diaphragm is pushing up into his throat, weak, SOB, pale. Abdomen in distended- otherwise extremities are normal. Patient is refusing ED- wants to see PCP. Present: O2 sat-77, P-130 Patient complexion is very pale. Advised 911/ED now. Reason for Disposition . Bluish (or gray) lips or face now  Answer Assessment - Initial Assessment Questions 1. RESPIRATORY STATUS: "Describe your breathing?" (e.g., wheezing, shortness of breath, unable to speak, severe coughing)      SOB- with exertion 2. ONSET: "When did this breathing problem begin?"      Few weeks 3. PATTERN "Does the difficult breathing come and go, or has it been constant since it started?"      Comes with exertion- 3 weeks- not sleeping 4. SEVERITY: "How bad is your breathing?" (e.g., mild, moderate, severe)    - MILD: No SOB at rest, mild SOB with walking, speaks normally in sentences, can lay down, no retractions, pulse < 100.    - MODERATE: SOB at rest, SOB with minimal exertion and prefers to sit, cannot lie down flat, speaks in phrases, mild retractions, audible wheezing, pulse 100-120.    - SEVERE: Very SOB at rest, speaks in single words, struggling to breathe, sitting hunched forward, retractions, pulse > 120      mild 5. RECURRENT SYMPTOM: "Have you had difficulty breathing before?" If so, ask: "When was the last time?" and "What happened that time?"      CHF,Afib- trouble with breathing has never been issue 6. CARDIAC HISTORY: "Do you have any history of heart disease?" (e.g., heart attack, angina, bypass surgery, angioplasty)      CHF 7. LUNG HISTORY: "Do you have any history of lung disease?"  (e.g., pulmonary embolus, asthma, emphysema)      8. CAUSE: "What do you think is causing the breathing problem?"      Not taking fluid  pulls at this time. Saturday and Sunday he took 1 pill and lost 5 lbs 9. OTHER SYMPTOMS: "Do you have any other symptoms? (e.g., dizziness, runny nose, cough, chest pain, fever)     no 10. PREGNANCY: "Is there any chance you are pregnant?" "When was your last menstrual period?"       n/a 11. TRAVEL: "Have you traveled out of the country in the last month?" (e.g., travel history, exposures)       n/a  Protocols used: BREATHING DIFFICULTY-A-AH

## 2019-12-04 NOTE — Telephone Encounter (Signed)
Copied from [place] [id number]. Topic: General - Other >> Dec 04, 2019  3:04 PM Greggory Keen D wrote: Reason for CRM: Pt's daughter Michael Lucas called saying she took her father to the ER at [facility].  She is not with him  now.  She said they took him from her and she is in the dark about what is going on.  She wants ot take him to [facility] in [place] if this regarding his heart.  He using [facility] in [place] and she would rather him to go to [place].  She would like Dr, Rosanna Randy or his nurse to give her a call back asap.  She would like Dr. Rosanna Randy to [facility] for him going to [facility].  CB#  [phone]

## 2019-12-04 NOTE — ED Provider Notes (Signed)
[facility] Emergency Department Provider Note   ____________________________________________    I have reviewed the triage vital signs and the nursing notes.   HISTORY  Chief Complaint Respiratory Distress     HPI Michael Lucas is a 74 y.o. male with a history of atrial fibrillation, CHF, CAD, diabetes who presents with shortness of breath.  Patient reports symptoms started nearly a week ago but have gradually worsened and become which more severe especially with exertion.  He denies fevers or chills.  Mild cough.  No body aches no loss of taste or smell.  Review of records demonstrates that he sees Dr. Fletcher Anon for cardiology.  No known Covid exposures  Past Medical History:  Diagnosis Date  . Anemia   . Ankle fracture, left   . Anxiety   . Arthritis   . Atrial flutter ([place])    a. 12/2008 s/p RFCA. Holter monitor 9/20 showed predominantly NSR with some short runs of atrial fibrillation. Coumadin stopped 7/11 due to frequent falls.  . Carotid arterial disease ([place])    a. 11/2011 U/S: 0-39% bilat ICA stenosis;  b. 03/2013 Carotid U/S: 0-39% bilat ICA stenosis.  . Cervical disc disease   . Chronic back pain   . Chronic diastolic CHF (congestive heart failure) ([place])    a. 07/2009 Echo: EF 50-55%, mild LVH, grade I DD, mild MR; b. 04/2015 Echo: Ef 65-70%, triv AI.  Marland Kitchen Chronic neck pain   . CKD (chronic kidney disease), stage III    stage III  . Coronary artery disease    a. 1995 s/p CABG x 1 (LIMA->LAD);  b. 04/2001 Cath: LAD 100, patent LIMA->LAD, otw nonobs LCX/RCA dzs.  . Depression   . Dermatitis   . Diabetes mellitus   . Dyspnea   . Dysrhythmia   . Edema    FEET/LEGS  . Fractured fibula   . Heart murmur   . Hypertensive heart disease   . Hypothyroidism   . Nephrolithiasis    h/o  . Orthopnea    2-3 PILLOWS  . OSA (obstructive sleep apnea)    a. Unable to tolerate CPAP because of allergic reaction to straps of face mask.  . Paroxysmal  atrial fibrillation ([place])    a. 07/2009 PAF noted on holter; b. 05/2010 Coumadin d/c'd 2/2 falls; c. 02/2015 CHA2DS2VASc = 5-->Xarelto.  . Peripheral neuropathy    suspect diabetes related. has led to gait instability. seen by [place] neurology, head MRI showed only mild diffuse atrophy with moderate peri-sylvan atrophy.  Marland Kitchen PTSD (post-traumatic stress disorder)    takes prazosin for this.  . Pulmonary embolism ([place]) 2010  . Stroke [facility])    POSSIBLE TIA  . Wears dentures    full upper and lower (only wears upper)    Patient Active Problem List   Diagnosis Date Noted  . Tendinitis of right wrist 01/25/2017  . Hypertensive heart disease   . Chronic diastolic CHF (congestive heart failure) ([place])   . Paroxysmal atrial fibrillation ([facility])   . CKD (chronic kidney disease), stage III   . Coronary artery disease   . Absolute anemia 04/03/2015  . Arthritis 04/03/2015  . Chronic pain associated with significant psychosocial dysfunction 04/03/2015  . Constipation 04/03/2015  . Coronary artery abnormality 04/03/2015  . Type 2 diabetes mellitus treated with insulin ([place]) 04/03/2015  . Essential (primary) hypertension 04/03/2015  . Broken fibula 04/03/2015  . Acid reflux 04/03/2015  . Adult hypothyroidism 04/03/2015  . Acute kidney failure ([place]) 04/03/2015  .  Calculus of kidney 04/03/2015  . Neuropathy 04/03/2015  . Arthritis, degenerative 04/03/2015  . 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 AF (paroxysmal atrial fibrillation) ([place]) 04/03/2015  . Decreased potassium in the blood 04/03/2015  . Neurosis, posttraumatic 04/03/2015  . Contusion of chest wall 04/03/2015  . Dermatitis seborrheica 04/03/2015  . Episode of syncope 04/03/2015  . Malaise 04/03/2015  . Clinical depression 04/03/2015  . Bilateral carotid artery stenosis 02/05/2014  . Orthostatic hypotension 06/02/2012  . Carotid bruit 11/05/2011  . Hyperlipidemia 04/10/2011  . UNSTEADY GAIT 06/24/2010  . DIZZINESS 01/21/2010  . ATRIAL  FIBRILLATION 09/08/2009  . OCCLUSION&STENOS CAROTID ART W/O MENTION INFARCT 09/08/2009  . DIASTOLIC HEART FAILURE, CHRONIC 08/12/2009  . EDEMA 08/12/2009  . Essential hypertension, benign 06/16/2009  . CAD, ARTERY BYPASS GRAFT 06/16/2009  . ATRIAL FLUTTER 06/16/2009  . LEG PAIN, BILATERAL 06/16/2009    Past Surgical History:  Procedure Laterality Date  . ABLATION     CARDIAC  . CATARACT EXTRACTION W/PHACO Right 11/04/2016   Procedure: CATARACT EXTRACTION PHACO AND INTRAOCULAR LENS PLACEMENT (IOC);  Surgeon: Eulogio Bear, MD;  Location: [facility] ORS;  Service: Ophthalmology;  Laterality: Right;  [place] 53.7 AP% 12.6 CDE 6.75 Fluid pack lot # [id number]  . CATARACT EXTRACTION W/PHACO Left 12/02/2016   Procedure: CATARACT EXTRACTION PHACO AND INTRAOCULAR LENS PLACEMENT (IOC);  Surgeon: Eulogio Bear, MD;  Location: [facility] ORS;  Service: Ophthalmology;  Laterality: Left;  [place] 43.5 AP% 12.5 CDE 5.48 Fluid Pack lot # [id number]  . COLONOSCOPY WITH PROPOFOL N/A 08/26/2017   Procedure: COLONOSCOPY WITH PROPOFOL;  Surgeon: Lucilla Lame, MD;  Location: [facility];  Service: Gastroenterology;  Laterality: N/A;  Diabetic - insulin  . CORONARY ARTERY BYPASS GRAFT  1995  . HEMORRHOID SURGERY    . HERNIA REPAIR    . POLYPECTOMY  08/26/2017   Procedure: POLYPECTOMY;  Surgeon: Lucilla Lame, MD;  Location: [facility];  Service: Gastroenterology;;  . [place]    . ROTATOR CUFF REPAIR      Prior to Admission medications   Medication Sig Start Date End Date Taking? Authorizing Provider  atorvastatin (LIPITOR) 80 MG tablet Take 80 mg by mouth every evening.     [provider]  cholecalciferol (VITAMIN D) 1000 units tablet Take 1,000 Units by mouth daily.    [provider]  docusate sodium (COLACE) 50 MG capsule Take 200 mg by mouth daily.     [provider]  Emollient (EUCERIN PLUS) 2.5-10 % CREA Apply 1 application topically daily as needed (dry  skin).     [provider]  feeding supplement, GLUCERNA SHAKE, (GLUCERNA SHAKE) LIQD Take 237 mLs by mouth daily.     [provider]  furosemide (LASIX) 20 MG tablet Take 20 mg by mouth daily. As needed    [provider]  insulin aspart (NOVOLOG) 100 UNIT/ML injection Inject 18 Units into the skin 3 (three) times daily with meals.  05/01/14   [provider]  insulin glargine (LANTUS) 100 UNIT/ML injection Inject 44 Units into the skin at bedtime.  05/01/14   [provider]  ketoconazole (NIZORAL) 2 % shampoo Apply 1 application topically See admin instructions. Apply shampoo to head every other day 05/01/14   [provider]  levothyroxine (SYNTHROID, LEVOTHROID) 50 MCG tablet Take 50 mcg by mouth daily before breakfast.  01/17/14   [provider]  loratadine (ALLERGY RELIEF) 10 MG tablet Take 10 mg by mouth daily as needed for allergies.  [provider]  metFORMIN (GLUCOPHAGE) 500 MG tablet Take 500 mg by mouth daily with breakfast.     [provider]  methadone (DOLOPHINE) 10 MG tablet Take 10 mg by mouth 2 (two) times daily.     [provider]  metoprolol (LOPRESSOR) 100 MG tablet Take 1 tablet (100 mg total) by mouth 2 (two) times daily. 03/04/15   Wellington Hampshire, MD  mometasone (ELOCON) 0.1 % cream Apply 1 application topically daily. 08/24/18   Jerrol Banana., MD  prazosin (MINIPRESS) 2 MG capsule Take 4 mg by mouth at bedtime.     [provider]  pregabalin (LYRICA) 150 MG capsule Take 1 capsule (150 mg total) by mouth 2 (two) times daily. 02/04/19 02/04/20  Duanne Guess, PA-C  QUEtiapine (SEROQUEL) 100 MG tablet Take 50 mg by mouth at bedtime.    [provider]  rivaroxaban (XARELTO) 20 MG TABS tablet Take 1 tablet (20 mg total) by mouth daily with supper. 03/04/15   Wellington Hampshire, MD  sertraline (ZOLOFT) 100 MG tablet Take 1 tablet (100 mg total) by mouth daily. 01/24/19    Jerrol Banana., MD  tacrolimus (PROTOPIC) 0.03 % ointment Apply 1 application topically every other day. Reported on 04/01/2016 05/01/14   [provider]     Allergies Patient has no known allergies.  Family History  Problem Relation Age of Onset  . Cancer Mother        Died from lung cancer  . Heart disease Father   . Cancer Sister        Rectal cancer  . Cancer Sister        colon cancer  . Alcohol abuse Sister        died from alcohol poisoning    Social History Social History   Tobacco Use  . Smoking status: Former Smoker    Types: Cigarettes    Quit date: 1987    Years since quitting: 34.0  . Smokeless tobacco: Never Used  Substance Use Topics  . Alcohol use: No  . Drug use: No    Review of Systems  Constitutional: As above Eyes: No visual changes.  ENT: No sore throat. Cardiovascular: As above Respiratory: As above Gastrointestinal: No abdominal pain.    Genitourinary: Negative for dysuria. Musculoskeletal: Negative for back pain. Skin: Negative for rash. Neurological: Negative for headaches    ____________________________________________   PHYSICAL EXAM:  VITAL SIGNS: ED Triage Vitals  Enc Vitals Group     BP 12/05/2019 1259 129/87     Pulse Rate 12/30/2019 1253 (!) 115     Resp 12/03/2019 1253 20     Temp 12/13/2019 1253 98.6 F (37 C)     Temp Source 12/10/2019 1253 Oral     SpO2 12/08/2019 1253 98 %     Weight 12/15/2019 1255 94.8 kg (209 lb)     Height 12/06/2019 1255 1.702 m (5\' 7" )     Head Circumference --      Peak Flow --      Pain Score 12/10/2019 1255 0     Pain Loc --      Pain Edu? --      Excl. in [place]? --     Constitutional: Alert and oriented.  Significant increased work of breathing  Nose: No congestion/rhinnorhea. Mouth/Throat: Mucous membranes are moist.   Neck:  Painless ROM Cardiovascular: Irregular rate, tachycardia, warm and well perfused Respiratory: Significant increased work of breathing with tachypnea,  bibasilar Rales, no significant wheezing Gastrointestinal: Soft and nontender. No distention.    Musculoskeletal: Mild lower extremity edema.  Warm and well perfused Neurologic:  Normal speech and language. No gross focal neurologic deficits are appreciated.  Skin:  Skin is warm, dry and intact. No rash noted. Psychiatric: Mood and affect are normal. Speech and behavior are normal.  ____________________________________________   LABS (all labs ordered are listed, but only abnormal results are displayed)  Labs Reviewed  COMPREHENSIVE METABOLIC PANEL - Abnormal; Notable for the following components:      Result Value   Sodium 131 (*)    Chloride 96 (*)    CO2 21 (*)    Glucose, Bld 179 (*)    Calcium 8.8 (*)    Alkaline Phosphatase 146 (*)    GFR calc non Af Amer 57 (*)    All other components within normal limits  BRAIN NATRIURETIC PEPTIDE - Abnormal; Notable for the following components:   B Natriuretic Peptide 276.0 (*)    All other components within normal limits  CBC WITH DIFFERENTIAL/PLATELET - Abnormal; Notable for the following components:   WBC 10.7 (*)    RBC 4.13 (*)    Hemoglobin 11.5 (*)    HCT 36.2 (*)    Neutro Abs 8.9 (*)    All other components within normal limits  BLOOD GAS, VENOUS - Abnormal; Notable for the following components:   pCO2, Ven 42 (*)    All other components within normal limits  CULTURE, BLOOD (ROUTINE X 2)  CULTURE, BLOOD (ROUTINE X 2)  RESPIRATORY PANEL BY RT PCR (FLU A&B, COVID)  LACTIC ACID, PLASMA  PROCALCITONIN  LACTIC ACID, PLASMA  POC SARS CORONAVIRUS 2 AG -  ED  POC SARS CORONAVIRUS 2 AG   ____________________________________________  EKG  ED ECG REPORT I, Lavonia Drafts, the attending physician, personally viewed and interpreted this ECG.  Date: 12/24/2019  Rhythm: Atrial fibrillation QRS Axis: normal Intervals: Abnormal ST/T Wave abnormalities: normal Narrative Interpretation: no evidence of acute  ischemia  ____________________________________________  RADIOLOGY  Chest x-ray with pulmonary edema versus multifocal pneumonia ____________________________________________   PROCEDURES  Procedure(s) performed: No  Procedures   Critical Care performed: yes  CRITICAL CARE Performed by: Lavonia Drafts   Total critical care time: 30 minutes  Critical care time was exclusive of separately billable procedures and treating other patients.  Critical care was necessary to treat or prevent imminent or life-threatening deterioration.  Critical care was time spent personally by me on the following activities: development of treatment plan with patient and/or surrogate as well as nursing, discussions with consultants, evaluation of patient's response to treatment, examination of patient, obtaining history from patient or surrogate, ordering and performing treatments and interventions, ordering and review of laboratory studies, ordering and review of radiographic studies, pulse oximetry and re-evaluation of patient's condition.  ____________________________________________   INITIAL IMPRESSION / ASSESSMENT AND PLAN / ED COURSE  Pertinent labs & imaging results that were available during my care of the patient were reviewed by me and considered in my medical decision making (see chart for details).  Patient presents with severe shortness of breath, noted to be severely hypoxic in triage in the 60s on room air.  Brought back to the room immediately and started on nasal cannula oxygen with good response.  Initially quite tachycardic and tachypneic with oxygen therapy and rest that has improved.  His heart rate has normalized he does have a history of atrial fibrillation.  Lab work demonstrates mildly elevated  BNP, white blood cell count minimally elevated.  Lactic normal, procalcitonin normal.  Covid antigen test is negative and presentation is suggestive of pulmonary edema secondary to CHF  given his history.  No fever, no other Covid symptoms.  We will send PCR  We will treat with IV Lasix, patient will require admission to the hospital service    ____________________________________________   FINAL CLINICAL IMPRESSION(S) / ED DIAGNOSES  Final diagnoses:  Acute respiratory failure with hypoxia ([place])  Acute pulmonary edema ([place])        Note:  This document was prepared using Dragon voice recognition software and may include unintentional dictation errors.   Lavonia Drafts, MD 12/21/2019 1435

## 2019-12-04 NOTE — Progress Notes (Signed)
   12/14/2019 1942  What Happened  Was fall witnessed? Yes  Who witnessed fall? daughter-maria  Patients activity before fall ambulating-assisted  Point of contact head;arm/shoulder  Was patient injured? Yes  Follow Up  MD notified Rachael Fee, NP  Time MD notified [place] notified Yes - comment (Maria-daughter)  Time family notified 91  Additional tests Yes-comment (CT head)  Adult Fall Risk Assessment  Risk Factor Category (scoring not indicated) Fall has occurred during this admission (document High fall risk)  Patient Fall Risk Level High fall risk  Adult Fall Risk Interventions  Required Bundle Interventions *See Row Information* High fall risk - low, moderate, and high requirements implemented  Additional Interventions Room near nurses station  Screening for Fall Injury Risk (To be completed on HIGH fall risk patients) - Assessing Need for Low Bed  Risk For Fall Injury- Low Bed Criteria Previous fall this admission  Will Implement Low Bed and Floor Mats Yes  Vitals  BP (!) 148/90  MAP (mmHg) 106  BP Method Automatic  Pulse Rate 90  Pulse Rate Source Monitor  Oxygen Therapy  SpO2 94 %  O2 Device Nasal Cannula  O2 Flow Rate (L/min) 6 L/min  Pain Assessment  Pain Scale 0-10  Pain Score 4  Pain Type Acute pain  Pain Location Head  Pain Orientation Upper  Pain Intervention(s) MD notified (Comment)  PCA/Epidural/Spinal Assessment  Respiratory Pattern Dyspnea with exertion  Neurological  Neuro (WDL) WDL  Musculoskeletal  Musculoskeletal (WDL) X  Generalized Weakness Yes  Integumentary  Integumentary (WDL) X  Skin Integrity Abrasion  Abrasion Location Head;Hand  Abrasion Location Orientation Left;Medial (left scalp, right hand)

## 2019-12-04 NOTE — H&P (Addendum)
[facility] [place] NAME: Michael Lucas    MR#:  [id number]  DATE OF BIRTH:  1946/01/06  DATE OF ADMISSION:  12/02/2019  PRIMARY CARE PHYSICIAN: Jerrol Banana., MD   REQUESTING/REFERRING PHYSICIAN: Dr Corky Downs  Patient coming from :Home   CHIEF COMPLAINT:   Increasing shortness of breath on and off for two-three days HISTORY OF PRESENT ILLNESS:  Michael Lucas  is a 74 y.o. male with a known history of CAD status post CABG, history of atrial flutter/relation status post ablation 2010, hypertension, gait instability with peripheral neuropathy, depression and chronic CHF diastolic comes to the emergency room with increasing shortness of breath for last couple days. Patient does not use oxygen at baseline.  ED course: in the emergency room patient was found to be hypoxic with sats in the 70s. He was placed on OXYGEN. Sats currently are 97%. She was tachycardic and tachypnea. Found to have elevated BNP with chest x-rays showing bilateral pulmonary edema/infiltrate. Received IV Decadron and Lasix 40 mg. Rapid antigen COVID is negative. COVID PCR pending  patient denies any fever, Cough, loss of taste. Denies any diarrhea. Patient is being admitted for acute on chronic diastolic congestive heart failure/pulmonary edema  PAST MEDICAL HISTORY:   Past Medical History:  Diagnosis Date  . Anemia   . Ankle fracture, left   . Anxiety   . Arthritis   . Atrial flutter ([place])    a. 12/2008 s/p RFCA. Holter monitor 9/20 showed predominantly NSR with some short runs of atrial fibrillation. Coumadin stopped 7/11 due to frequent falls.  . Carotid arterial disease ([place])    a. 11/2011 U/S: 0-39% bilat ICA stenosis;  b. 03/2013 Carotid U/S: 0-39% bilat ICA stenosis.  . Cervical disc disease   . Chronic back pain   . Chronic diastolic CHF (congestive heart failure) ([place])    a. 07/2009 Echo: EF 50-55%, mild LVH, grade I DD, mild MR; b. 04/2015 Echo: Ef  65-70%, triv AI.  Marland Kitchen Chronic neck pain   . CKD (chronic kidney disease), stage III    stage III  . Coronary artery disease    a. 1995 s/p CABG x 1 (LIMA->LAD);  b. 04/2001 Cath: LAD 100, patent LIMA->LAD, otw nonobs LCX/RCA dzs.  . Depression   . Dermatitis   . Diabetes mellitus   . Dyspnea   . Dysrhythmia   . Edema    FEET/LEGS  . Fractured fibula   . Heart murmur   . Hypertensive heart disease   . Hypothyroidism   . Nephrolithiasis    h/o  . Orthopnea    2-3 PILLOWS  . OSA (obstructive sleep apnea)    a. Unable to tolerate CPAP because of allergic reaction to straps of face mask.  . Paroxysmal atrial fibrillation ([place])    a. 07/2009 PAF noted on holter; b. 05/2010 Coumadin d/c'd 2/2 falls; c. 02/2015 CHA2DS2VASc = 5-->Xarelto.  . Peripheral neuropathy    suspect diabetes related. has led to gait instability. seen by [facility] neurology, head MRI showed only mild diffuse atrophy with moderate peri-sylvan atrophy.  Marland Kitchen PTSD (post-traumatic stress disorder)    takes prazosin for this.  . Pulmonary embolism ([place]) 2010  . Stroke [facility])    POSSIBLE TIA  . Wears dentures    full upper and lower (only wears upper)    PAST SURGICAL HISTOIRY:   Past Surgical History:  Procedure Laterality Date  . ABLATION     CARDIAC  .  CATARACT EXTRACTION W/PHACO Right 11/04/2016   Procedure: CATARACT EXTRACTION PHACO AND INTRAOCULAR LENS PLACEMENT (IOC);  Surgeon: Eulogio Bear, MD;  Location: [facility] ORS;  Service: Ophthalmology;  Laterality: Right;  [place] 53.7 AP% 12.6 CDE 6.75 Fluid pack lot # [id number]  . CATARACT EXTRACTION W/PHACO Left 12/02/2016   Procedure: CATARACT EXTRACTION PHACO AND INTRAOCULAR LENS PLACEMENT (IOC);  Surgeon: Eulogio Bear, MD;  Location: [facility] ORS;  Service: Ophthalmology;  Laterality: Left;  [place] 43.5 AP% 12.5 CDE 5.48 Fluid Pack lot # [id number]  . COLONOSCOPY WITH PROPOFOL N/A 08/26/2017   Procedure: COLONOSCOPY WITH PROPOFOL;  Surgeon: Lucilla Lame, MD;  Location:  [place];  Service: Gastroenterology;  Laterality: N/A;  Diabetic - insulin  . CORONARY ARTERY BYPASS GRAFT  1995  . HEMORRHOID SURGERY    . HERNIA REPAIR    . POLYPECTOMY  08/26/2017   Procedure: POLYPECTOMY;  Surgeon: Lucilla Lame, MD;  Location: [place];  Service: Gastroenterology;;  . [place]    . ROTATOR CUFF REPAIR      SOCIAL HISTORY:   Social History   Tobacco Use  . Smoking status: Former Smoker    Types: Cigarettes    Quit date: 1987    Years since quitting: 34.0  . Smokeless tobacco: Never Used  Substance Use Topics  . Alcohol use: No    FAMILY HISTORY:   Family History  Problem Relation Age of Onset  . Cancer Mother        Died from lung cancer  . Heart disease Father   . Cancer Sister        Rectal cancer  . Cancer Sister        colon cancer  . Alcohol abuse Sister        died from alcohol poisoning    DRUG ALLERGIES:  No Known Allergies  REVIEW OF SYSTEMS:  Review of Systems  Constitutional: Positive for malaise/fatigue. Negative for chills, fever and weight loss.  HENT: Negative for ear discharge, ear pain and nosebleeds.   Eyes: Negative for blurred vision, pain and discharge.  Respiratory: Positive for shortness of breath. Negative for sputum production, wheezing and stridor.   Cardiovascular: Positive for orthopnea and PND. Negative for chest pain and palpitations.  Gastrointestinal: Negative for abdominal pain, diarrhea, nausea and vomiting.  Genitourinary: Negative for frequency and urgency.  Musculoskeletal: Negative for back pain and joint pain.  Neurological: Positive for weakness. Negative for sensory change, speech change and focal weakness.  Psychiatric/Behavioral: Negative for depression and hallucinations. The patient is not nervous/anxious.      MEDICATIONS AT HOME:   Prior to Admission medications   Medication Sig Start Date End Date Taking? Authorizing Provider  atorvastatin (LIPITOR) 80 MG  tablet Take 80 mg by mouth every evening.    Yes [provider]  cholecalciferol (VITAMIN D) 1000 units tablet Take 1,000 Units by mouth daily.   Yes [provider]  docusate sodium (COLACE) 50 MG capsule Take 200 mg by mouth daily.    Yes [provider]  furosemide (LASIX) 20 MG tablet Take 20 mg by mouth daily. As needed   Yes [provider]  insulin glargine (LANTUS) 100 UNIT/ML injection Inject 46 Units into the skin at bedtime.  05/01/14  Yes [provider]  ketoconazole (NIZORAL) 2 % shampoo Apply 1 application topically See admin instructions. Apply shampoo to head every other day 05/01/14  Yes [provider]  levothyroxine (SYNTHROID, LEVOTHROID) 50 MCG tablet Take  50 mcg by mouth daily before breakfast.  01/17/14  Yes [provider]  loratadine (ALLERGY RELIEF) 10 MG tablet Take 10 mg by mouth daily as needed for allergies.   Yes [provider]  metFORMIN (GLUCOPHAGE) 500 MG tablet Take 500 mg by mouth daily with breakfast.    Yes [provider]  methadone (DOLOPHINE) 10 MG tablet Take 10 mg by mouth 2 (two) times daily.    Yes [provider]  metoprolol (LOPRESSOR) 100 MG tablet Take 1 tablet (100 mg total) by mouth 2 (two) times daily. 03/04/15  Yes Wellington Hampshire, MD  prazosin (MINIPRESS) 2 MG capsule Take 6 mg by mouth at bedtime.    Yes [provider]  pregabalin (LYRICA) 150 MG capsule Take 1 capsule (150 mg total) by mouth 2 (two) times daily. 02/04/19 02/04/20 Yes Duanne Guess, PA-C  QUEtiapine (SEROQUEL) 100 MG tablet Take 50 mg by mouth at bedtime.   Yes [provider]  rivaroxaban (XARELTO) 20 MG TABS tablet Take 1 tablet (20 mg total) by mouth daily with supper. 03/04/15  Yes Wellington Hampshire, MD  sertraline (ZOLOFT) 100 MG tablet Take 1 tablet (100 mg total) by mouth daily. 01/24/19  Yes Jerrol Banana., MD      VITAL SIGNS:  Blood pressure 126/82, pulse  92, temperature 98.6 F (37 C), temperature source Oral, resp. rate 19, height 5\' 7"  (1.702 m), weight 94.8 kg, SpO2 96 %.  PHYSICAL EXAMINATION:  GENERAL:  74 y.o.-year-old patient lying in the bed with mild acute distress.  EYES: Pupils equal, round, reactive to light and accommodation. No scleral icterus.  HEENT: Head atraumatic, normocephalic. Oropharynx and nasopharynx clear.  NECK:  Supple, no jugular venous distention. No thyroid enlargement, no tenderness.  LUNGS:  decreasedl breath sounds bilaterally, no wheezing, bilateral rales ++,no rhonchi or crepitation. No use of accessory muscles of respiration.  CARDIOVASCULAR: S1, S2 normal. No murmurs, rubs, or gallops. tachycardia ABDOMEN: Soft, nontender, nondistended. Bowel sounds present. No organomegaly or mass.  EXTREMITIES: + pedal edema, no cyanosis, or clubbing.  NEUROLOGIC: Cranial nerves II through XII are intact. Muscle strength 5/5 in all extremities. Sensation intact. Gait not checked.  PSYCHIATRIC: The patient is alert and oriented x 3.  SKIN: No obvious rash, lesion, or ulcer.   LABORATORY PANEL:   CBC Recent Labs  Lab 12/17/2019 1252  WBC 10.7*  HGB 11.5*  HCT 36.2*  PLT 297   ------------------------------------------------------------------------------------------------------------------  Chemistries  Recent Labs  Lab 12/19/2019 1252  NA 131*  K 3.7  CL 96*  CO2 21*  GLUCOSE 179*  BUN 18  CREATININE 1.24  CALCIUM 8.8*  AST 38  ALT 28  ALKPHOS 146*  BILITOT 0.9   ------------------------------------------------------------------------------------------------------------------  Cardiac Enzymes No results for input(s): TROPONINI in the last 168 hours. ------------------------------------------------------------------------------------------------------------------  RADIOLOGY:  DG Chest Port 1 View  Result Date: 12/24/2019 CLINICAL DATA:  Shortness of breath. EXAM: PORTABLE CHEST 1 VIEW COMPARISON:   Chest x-ray 10/24/2013. FINDINGS: Prior median sternotomy. Borderline cardiomegaly. Diffuse severe bilateral pulmonary infiltrates. No pleural effusion or pneumothorax. No acute bony abnormality. IMPRESSION: 1. Diffuse severe bilateral pulmonary infiltrates/edema. Bilateral multifocal pneumonia and or pulmonary edema could present this fashion. 2.  Prior median sternotomy.  Borderline cardiomegaly. Electronically Signed   By: Marcello Moores  Register   On: 12/16/2019 13:21    EKG:    IMPRESSION AND PLAN:   Michael Lucas  is a 74 y.o. male with a known history of CAD  status post CABG, history of atrial flutter/relation status post ablation 2010, hypertension, gait instability with peripheral neuropathy, depression and chronic CHF diastolic comes to the emergency room with increasing shortness of breath for last couple days. Patient does not use oxygen at baseline  1. Acute hypoxic respiratory failure secondary to acute on chronic diastolic congestive heart failure. -patient presented with increasing shortness of breath, tachycardia tachypnea found to have elevated BNP and bilateral pulmonary vascular congestion -admit to telemetry floor -IV Lasix 40 mg BID, monitor input output, daily weight -cardiology consultation with Dr. ENd. Patient follows with Dr. Fletcher Anon -does not take Lasix at home on a regular basis. -on BB -clinically does not seem like PNA--cont to monitor. Procalcitonin 0.21, no fever, Wbc 10.7. will hold off abxs for now and initiate if symptoms occur.  2. Chronic atrial fibrillation -continue metoprolol 100 milligrams BID -on Xarelto  3. Type II diabetes, hypyerglycemia -ssi and cont Insulin Lantus  -Holding metformin  4. Hyperlipidemia continue treatment with atorvastatin  5. Coronary artery disease no symptoms of angina  6. DVT prophylaxis already on Xarelto  7. H/o depression cont seroquel, zoloft  8. hypothyroidsim--on synthroid  9. Pt is on chronic methadone.  Family  Communication : patient Consults : [facility] cardiology Code Status : full code discussed with patient in the ER DVT prophylaxis : Xarelto Discharge Disposition: anticipate back in home environment. Pt lives at home and is fairly independent and walks independent.  TOTAL TIME TAKING CARE OF THIS PATIENT: 50 minutes.    Fritzi Mandes M.D on 12/22/2019 at 3:36 PM  Between 7am to 6pm - Pager - [phone]  After 6pm go to [email or web address] - password [email or web address] [facility]    CC: Primary care physician; Jerrol Banana., MD

## 2019-12-04 NOTE — Consult Note (Signed)
Cardiology Consultation:   Patient ID: Michael Lucas; [id number]; 10/05/1946   Admit date: 12/08/2019 Date of Consult: 11/30/2019  Primary Care Provider: Jerrol Banana., MD Primary Cardiologist: Fletcher Anon   Patient Profile:   Michael Lucas is a 74 y.o. male with a hx of CAD status post one-vessel CABG in 1995 with LIMA to LAD, atrial flutter status post ablation in 2010, chronic A. fib previously on Coumadin though discontinued in 2011 secondary to frequent falls now on Xarelto and tolerating, HFpEF, pulmonary hypertension, CKD stage III, HTN, HLD, gait instability with peripheral neuropathy, mild nonobstructive bilateral carotid artery disease, and OSA intolerant to CPAP who is being seen today for the evaluation of acute on chronic HFpEF at the request of Dr. Posey Pronto.  History of Present Illness:   [name] most recently underwent cardiac cath in 04/2001 which showed a patent LIMA to LAD with otherwise nonobstructive disease involving the LCx and RCA.  Most recent echo from 04/2016 showed normal LV systolic function with an EF of 55 to 60%, mild mitral regurgitation, mildly dilated left atrium, mild pulmonary hypertension.  He has not required standing diuretic therapy.  He was most recently seen in the office in 07/2019 and was doing well at that time since he had not been seen in greater than 3 years prior.  He was euvolemic with a weight of 100.7 kg.  He was tolerating anticoagulation without issue.  Dating back to mid last week, patient has had chest congestion with cough productive of yellow to brown sputum.  He denies any fevers or chills.  No lower extremity swelling or abdominal distention.  Indicates his weight has been stable around 206-210 pounds.  At baseline, he does not require standing diuretic however in the setting of the above he did contact the [place] [facility] and was advised to take 1 Lasix tab.  With this, patient did note some initial improvement in symptoms.   However, his dyspnea and orthopnea persisted and ultimately worsened overnight.  He does report recently eating a meat loaf that had a higher than usual sodium content for him.  He also notes having recently missed some doses of Xarelto.  Patient's daughter contacted PCP this morning with reported O2 sats in the 70s to 80s on room air with heart rates in the 130s bpm with pale complexion and lips blue/gray.  In the setting, patient was advised to proceed to the ED.  Upon the patient's arrival to [facility] they were found to have stable BP and heart rate.  Afebrile.  Oxygen saturation in the upper 90s% on supplemental oxygen via nasal cannula 6 L since arrival.  Weight 94.8 kg which is down approximately 6 kg when compared to his last office visit at which time he was felt to be euvolemic. EKG showed A. fib as below, CXR concerning for possible multifocal pneumonia/lingular pneumonia with some degree of edema. Labs showed BNP 276, WBC 10.7, Hgb 11.5, PLT 297, potassium 3.7, BUN 18, creatinine 1.24, albumin 3.6, AST/ALT normal, PCT 0.21, blood cultures pending, rapid Covid negative.  Patient continues to require supplemental oxygen via nasal cannula at 6 L/min.  He denies any chest pain, palpitations, lower extremity swelling, abdominal distention, or early satiety.  Past Medical History:  Diagnosis Date  . Anemia   . Ankle fracture, left   . Anxiety   . Arthritis   . Atrial flutter ([place])    a. 12/2008 s/p RFCA. Holter monitor 9/20 showed predominantly NSR with some  short runs of atrial fibrillation. Coumadin stopped 7/11 due to frequent falls.  . Carotid arterial disease ([place])    a. 11/2011 U/S: 0-39% bilat ICA stenosis;  b. 03/2013 Carotid U/S: 0-39% bilat ICA stenosis.  . Cervical disc disease   . Chronic back pain   . Chronic diastolic CHF (congestive heart failure) ([place])    a. 07/2009 Echo: EF 50-55%, mild LVH, grade I DD, mild MR; b. 04/2015 Echo: Ef 65-70%, triv AI.  Marland Kitchen Chronic neck pain   . CKD  (chronic kidney disease), stage III    stage III  . Coronary artery disease    a. 1995 s/p CABG x 1 (LIMA->LAD);  b. 04/2001 Cath: LAD 100, patent LIMA->LAD, otw nonobs LCX/RCA dzs.  . Depression   . Dermatitis   . Diabetes mellitus   . Dyspnea   . Edema    FEET/LEGS  . Fractured fibula   . Heart murmur   . Hypertensive heart disease   . Hypothyroidism   . Nephrolithiasis    h/o  . Orthopnea    2-3 PILLOWS  . OSA (obstructive sleep apnea)    a. Unable to tolerate CPAP because of allergic reaction to straps of face mask.  . Paroxysmal atrial fibrillation ([place])    a. 07/2009 PAF noted on holter; b. 05/2010 Coumadin d/c'd 2/2 falls; c. 02/2015 CHA2DS2VASc = 5-->Xarelto.  . Peripheral neuropathy    suspect diabetes related. has led to gait instability. seen by [place] neurology, head MRI showed only mild diffuse atrophy with moderate peri-sylvan atrophy.  Marland Kitchen PTSD (post-traumatic stress disorder)    takes prazosin for this.  . Pulmonary embolism ([place]) 2010  . Stroke [facility])    POSSIBLE TIA  . Wears dentures    full upper and lower (only wears upper)    Past Surgical History:  Procedure Laterality Date  . ABLATION     CARDIAC  . CATARACT EXTRACTION W/PHACO Right 11/04/2016   Procedure: CATARACT EXTRACTION PHACO AND INTRAOCULAR LENS PLACEMENT (IOC);  Surgeon: Eulogio Bear, MD;  Location: [facility] ORS;  Service: Ophthalmology;  Laterality: Right;  [place] 53.7 AP% 12.6 CDE 6.75 Fluid pack lot # [id number]  . CATARACT EXTRACTION W/PHACO Left 12/02/2016   Procedure: CATARACT EXTRACTION PHACO AND INTRAOCULAR LENS PLACEMENT (IOC);  Surgeon: Eulogio Bear, MD;  Location: [facility] ORS;  Service: Ophthalmology;  Laterality: Left;  [place] 43.5 AP% 12.5 CDE 5.48 Fluid Pack lot # [id number]  . COLONOSCOPY WITH PROPOFOL N/A 08/26/2017   Procedure: COLONOSCOPY WITH PROPOFOL;  Surgeon: Lucilla Lame, MD;  Location: [place];  Service: Gastroenterology;  Laterality: N/A;  Diabetic - insulin  .  CORONARY ARTERY BYPASS GRAFT  1995  . HEMORRHOID SURGERY    . HERNIA REPAIR    . POLYPECTOMY  08/26/2017   Procedure: POLYPECTOMY;  Surgeon: Lucilla Lame, MD;  Location: [place];  Service: Gastroenterology;;  . [place]    . ROTATOR CUFF REPAIR       Home Meds: Prior to Admission medications   Medication Sig Start Date End Date Taking? Authorizing Provider  atorvastatin (LIPITOR) 80 MG tablet Take 80 mg by mouth every evening.    Yes [provider]  cholecalciferol (VITAMIN D) 1000 units tablet Take 1,000 Units by mouth daily.   Yes [provider]  docusate sodium (COLACE) 50 MG capsule Take 200 mg by mouth daily.    Yes [provider]  furosemide (LASIX) 20 MG tablet Take 20 mg by mouth daily. As  needed   Yes [provider]  insulin glargine (LANTUS) 100 UNIT/ML injection Inject 46 Units into the skin at bedtime.  05/01/14  Yes [provider]  ketoconazole (NIZORAL) 2 % shampoo Apply 1 application topically See admin instructions. Apply shampoo to head every other day 05/01/14  Yes [provider]  levothyroxine (SYNTHROID, LEVOTHROID) 50 MCG tablet Take 50 mcg by mouth daily before breakfast.  01/17/14  Yes [provider]  loratadine (ALLERGY RELIEF) 10 MG tablet Take 10 mg by mouth daily as needed for allergies.   Yes [provider]  metFORMIN (GLUCOPHAGE) 500 MG tablet Take 500 mg by mouth daily with breakfast.    Yes [provider]  methadone (DOLOPHINE) 10 MG tablet Take 10 mg by mouth 2 (two) times daily.    Yes [provider]  metoprolol (LOPRESSOR) 100 MG tablet Take 1 tablet (100 mg total) by mouth 2 (two) times daily. 03/04/15  Yes Wellington Hampshire, MD  prazosin (MINIPRESS) 2 MG capsule Take 6 mg by mouth at bedtime.    Yes [provider]  pregabalin (LYRICA) 150 MG capsule Take 1 capsule (150 mg total) by mouth 2 (two) times daily. 02/04/19 02/04/20 Yes Duanne Guess, PA-C  QUEtiapine (SEROQUEL) 100 MG tablet Take 50 mg by mouth at bedtime.   Yes [provider]  rivaroxaban (XARELTO) 20 MG TABS tablet Take 1 tablet (20 mg total) by mouth daily with supper. 03/04/15  Yes Wellington Hampshire, MD  sertraline (ZOLOFT) 100 MG tablet Take 1 tablet (100 mg total) by mouth daily. 01/24/19  Yes Jerrol Banana., MD    Inpatient Medications: Scheduled Meds: . atorvastatin  80 mg Oral QPM  . cholecalciferol  1,000 Units Oral Daily  . furosemide  40 mg Intravenous BID  . insulin glargine  46 Units Subcutaneous QHS  . [START ON 12/05/2019] levothyroxine  50 mcg Oral QAC breakfast  . methadone  10 mg Oral BID  . metoprolol tartrate  100 mg Oral BID  . prazosin  6 mg Oral QHS  . pregabalin  150 mg Oral BID  . QUEtiapine  50 mg Oral QHS  . rivaroxaban  20 mg Oral Q supper  . sertraline  100 mg Oral Daily  . sodium chloride flush  3 mL Intravenous Q12H   Continuous Infusions: . sodium chloride     PRN Meds: sodium chloride, acetaminophen, ondansetron (ZOFRAN) IV, sodium chloride flush  Allergies:  No Known Allergies  Social History:   Social History   Socioeconomic History  . Marital status: Widowed    Spouse name: Not on file  . Number of children: 1  . Years of education: Not on file  . Highest education level: Doctorate  Occupational History  . Occupation: Disabled    Employer: SELF EMPLOYED  Tobacco Use  . Smoking status: Former Smoker    Types: Cigarettes    Quit date: 1987    Years since quitting: 34.0  . Smokeless tobacco: Never Used  Substance and Sexual Activity  . Alcohol use: No  . Drug use: No  . Sexual activity: Not on file  Other Topics Concern  . Not on file  Social History Narrative   Lives in [place].   Social Determinants of Health   Financial Resource Strain: Low Risk   . Difficulty of Paying Living Expenses: Not hard at all  Food Insecurity: No Food Insecurity  . Worried About Sales executive in the  Last Year: Never true  . Ran Out of Food in the Last Year: Never true  Transportation Needs: No Transportation Needs  . Lack of Transportation (Medical): No  . Lack of Transportation (Non-Medical): No  Physical Activity: Inactive  . Days of Exercise per Week: 0 days  . Minutes of Exercise per Session: 0 min  Stress: No Stress Concern Present  . Feeling of Stress : Not at all  Social Connections: Unknown  . Frequency of Communication with Friends and Family: Patient refused  . Frequency of Social Gatherings with Friends and Family: Patient refused  . Attends Religious Services: Patient refused  . Active Member of Clubs or Organizations: Patient refused  . Attends Archivist Meetings: Patient refused  . Marital Status: Patient refused  Intimate Partner Violence: Unknown  . Fear of Current or Ex-Partner: Patient refused  . Emotionally Abused: Patient refused  . Physically Abused: Patient refused  . Sexually Abused: Patient refused     Family History:   Family History  Problem Relation Age of Onset  . Cancer Mother        Died from lung cancer  . Heart disease Father   . Cancer Sister        Rectal cancer  . Cancer Sister        colon cancer  . Alcohol abuse Sister        died from alcohol poisoning    ROS:  Review of Systems  Constitutional: Positive for malaise/fatigue. Negative for chills, diaphoresis, fever and weight loss.  HENT: Negative for congestion.   Eyes: Negative for discharge and redness.  Respiratory: Positive for cough, sputum production and shortness of breath. Negative for hemoptysis and wheezing.        Cough productive of brown to yellow sputum  Cardiovascular: Positive for orthopnea. Negative for chest pain, palpitations, claudication, leg swelling and PND.  Gastrointestinal: Negative for abdominal pain, blood in stool, heartburn, melena, nausea and vomiting.  Genitourinary: Negative for hematuria.  Musculoskeletal: Negative for  falls and myalgias.  Skin: Negative for rash.  Neurological: Positive for weakness. Negative for dizziness, tingling, tremors, sensory change, speech change, focal weakness and loss of consciousness.  Endo/Heme/Allergies: Does not bruise/bleed easily.  Psychiatric/Behavioral: Negative for substance abuse. The patient is not nervous/anxious.   All other systems reviewed and are negative.     Physical Exam/Data:   Vitals:   12/02/2019 1300 12/01/2019 1430 12/14/2019 1553 12/01/2019 1600  BP: 121/84 126/82  (!) 128/91  Pulse:  92  71  Resp: (!) 24 19  20   Temp:      TempSrc:      SpO2:  96% 96% 93%  Weight:      Height:       No intake or output data in the 24 hours ending 12/20/2019 1700 Filed Weights   12/27/2019 1255  Weight: 94.8 kg   Body mass index is 32.73 kg/m.   Physical Exam: General: Well developed, well nourished, in no acute distress. Head: Normocephalic, atraumatic, sclera non-icteric, no xanthomas, nares without discharge.  Neck: Negative for carotid bruits. JVD elevated approximately 10 cm. Lungs: Crackles heard along the left lingula. Breathing is mildly labored on supplemental oxygen via nasal cannula. Heart: Irregularly irregular with S1 S2. No murmurs, rubs, or gallops appreciated. Abdomen: Soft, non-tender, non-distended with normoactive bowel sounds. No hepatomegaly. No rebound/guarding. No obvious abdominal masses. Msk:  Strength and tone appear normal for age. Extremities: No clubbing or cyanosis. No edema. Distal pedal pulses are  2+ and equal bilaterally. Neuro: Alert and oriented X 3. No facial asymmetry. No focal deficit. Moves all extremities spontaneously. Psych:  Responds to questions appropriately with a normal affect.   EKG:  The EKG was personally reviewed and demonstrates: A. fib, 95 bpm, possible prior anteroseptal infarct, no acute ST-T changes Telemetry:  Telemetry was personally reviewed and demonstrates: A. fib with ventricular rates in the 80s to 1  teens bpm  Weights: Filed Weights   12/21/2019 1255  Weight: 94.8 kg    Relevant CV Studies:  2D echo 04/2016: - Left ventricle: The cavity size was normal. There was mild   concentric hypertrophy. Systolic function was normal. The   estimated ejection fraction was in the range of 55% to 60%. Wall   motion was normal; there were no regional wall motion   abnormalities. - Mitral valve: There was mild regurgitation. - Left atrium: The atrium was mildly dilated. - Pulmonary arteries: Systolic pressure was mildly increased. PA   peak pressure: 40 mm Hg (S). __________  2D echo this admission pending   Laboratory Data:  Chemistry Recent Labs  Lab 12/13/2019 1252  NA 131*  K 3.7  CL 96*  CO2 21*  GLUCOSE 179*  BUN 18  CREATININE 1.24  CALCIUM 8.8*  GFRNONAA 57*  GFRAA >60  ANIONGAP 14    Recent Labs  Lab 12/20/2019 1252  PROT 7.8  ALBUMIN 3.6  AST 38  ALT 28  ALKPHOS 146*  BILITOT 0.9   Hematology Recent Labs  Lab 12/06/2019 1252  WBC 10.7*  RBC 4.13*  HGB 11.5*  HCT 36.2*  MCV 87.7  MCH 27.8  MCHC 31.8  RDW 13.3  PLT 297   Cardiac EnzymesNo results for input(s): TROPONINI in the last 168 hours. No results for input(s): TROPIPOC in the last 168 hours.  BNP Recent Labs  Lab 12/25/2019 1252  BNP 276.0*    DDimer No results for input(s): DDIMER in the last 168 hours.  Radiology/Studies:  [facility] Chest Port 1 View  Result Date: 12/26/2019 IMPRESSION: 1. Diffuse severe bilateral pulmonary infiltrates/edema. Bilateral multifocal pneumonia and or pulmonary edema could present this fashion. 2.  Prior median sternotomy.  Borderline cardiomegaly. Electronically Signed   By: Marcello Moores  Register   On: 12/20/2019 13:21    Assessment and Plan:   1.  Acute hypoxic respiratory distress: -Patient with O2 saturations in the 70s to 80s on room air at home -Suspect this is multifactorial including concern for left lingular pneumonia on chest x-ray as well as acute on chronic  diastolic CHF -Less likely the patient's degree of CHF exacerbation solely explains his hypoxia documented at home -Consider de-escalation of steroids as patient is not wheezing on exam and there is no known history of COPD.  This also could potentially exacerbate his ventricular rates and any potential fluid overload -Consider escalation of antibiotic therapy despite PCT of 0.21 -PE is less likely given the patient is on Xarelto however he does indicate he has missed some doses here there, including recently.  Defer further evaluation of this to internal medicine at their discretion  2.  Acute on chronic HFpEF/pulmonary hypertension: -Patient does have mild JVD on exam though otherwise does not appear significantly volume overloaded -Possibly exacerbated by recent salty meatloaf as well as in the setting of pulmonary hypertension with known sleep apnea not on CPAP -His weight is down approximately 6 kg when compared to his last office visit at which time he was felt to be euvolemic -  Continue IV Lasix 40 mg twice daily throughout today with reassessment tomorrow -BNP is not significantly elevated at a value of 276 -Minimal crackles on posterior auscultation -Check echo  3.  CAD status post CABG: -Patient denies any angina -Troponin not cycled -On Xarelto in place of aspirin -Continue Lopressor and atorvastatin -Check echo as above -At this time, no plans for inpatient ischemic evaluation  4.  Chronic A. fib: -He remains in A. fib with controlled ventricular rates -Continue Xarelto 20 mg q. dinner given Estimated Creatinine Clearance: 57.4 mL/min (by C-G formula based on SCr of 1.24 mg/dL).  5.  Atrial flutter: -Status post RCA in 2010  6.  Possible pneumonia: -Patient reports a history of cough productive of brown to yellow sputum with chest congestion -Blood cultures pending -Consider addition of antibiotics as above at the discretion of internal medicine  7.  HLD: -Continue  atorvastatin with most recent LDL of 43 from 09/2018   For questions or updates, please contact [place] Please consult [email or web address] for contact info under Cardiology/STEMI.   Signed, Christell Faith, PA-C [place] Pager: [phone] 12/20/2019, 5:00 PM

## 2019-12-05 ENCOUNTER — Inpatient Hospital Stay: Admit: 2019-12-05 | Payer: Medicare Other

## 2019-12-05 ENCOUNTER — Inpatient Hospital Stay (HOSPITAL_COMMUNITY)
Admit: 2019-12-05 | Discharge: 2019-12-05 | Disposition: A | Discharge status: Home / Self Care | Payer: Medicare Other | Attending: Internal Medicine | Admitting: Internal Medicine

## 2019-12-05 DIAGNOSIS — I340 Nonrheumatic mitral (valve) insufficiency: Secondary | ICD-10-CM

## 2019-12-05 DIAGNOSIS — J9601 Acute respiratory failure with hypoxia: Secondary | ICD-10-CM

## 2019-12-05 DIAGNOSIS — J810 Acute pulmonary edema: Secondary | ICD-10-CM

## 2019-12-05 DIAGNOSIS — E1142 Type 2 diabetes mellitus with diabetic polyneuropathy: Secondary | ICD-10-CM

## 2019-12-05 DIAGNOSIS — I25118 Atherosclerotic heart disease of native coronary artery with other forms of angina pectoris: Secondary | ICD-10-CM

## 2019-12-05 DIAGNOSIS — G9340 Encephalopathy, unspecified: Secondary | ICD-10-CM

## 2019-12-05 DIAGNOSIS — Z794 Long term (current) use of insulin: Secondary | ICD-10-CM

## 2019-12-05 LAB — BLOOD GAS, ARTERIAL
Acid-Base Excess: 1.3 mmol/L (ref 0.0–2.0)
Acid-Base Excess: 1.9 mmol/L (ref 0.0–2.0)
Bicarbonate: 25.9 mmol/L (ref 20.0–28.0)
Bicarbonate: 26.6 mmol/L (ref 20.0–28.0)
FIO2: 100
FIO2: 1
O2 Saturation: 98.4 %
O2 Saturation: 90.2 %
Patient temperature: 37
Patient temperature: 37
pCO2 arterial: 40 mmHg (ref 32.0–48.0)
pCO2 arterial: 41 mmHg (ref 32.0–48.0)
pH, Arterial: 7.42 (ref 7.350–7.450)
pH, Arterial: 7.42 (ref 7.350–7.450)
pO2, Arterial: 110 mmHg — ABNORMAL HIGH (ref 83.0–108.0)
pO2, Arterial: 58 mmHg — ABNORMAL LOW (ref 83.0–108.0)

## 2019-12-05 LAB — TROPONIN I (HIGH SENSITIVITY)
Troponin I (High Sensitivity): 87 ng/L — ABNORMAL HIGH (ref <18)
Troponin I (High Sensitivity): 89 ng/L — ABNORMAL HIGH (ref <18)
Troponin I (High Sensitivity): 95 ng/L — ABNORMAL HIGH (ref <18)

## 2019-12-05 LAB — CBC
HCT: 31.4 % — ABNORMAL LOW (ref 39.0–52.0)
Hemoglobin: 10.4 g/dL — ABNORMAL LOW (ref 13.0–17.0)
MCH: 28.6 pg (ref 26.0–34.0)
MCHC: 33.1 g/dL (ref 30.0–36.0)
MCV: 86.3 fL (ref 80.0–100.0)
Platelets: 260 10*3/uL (ref 150–400)
RBC: 3.64 MIL/uL — ABNORMAL LOW (ref 4.22–5.81)
RDW: 13.2 % (ref 11.5–15.5)
WBC: 11.6 10*3/uL — ABNORMAL HIGH (ref 4.0–10.5)
nRBC: 0 % (ref 0.0–0.2)

## 2019-12-05 LAB — BASIC METABOLIC PANEL
Anion gap: 11 (ref 5–15)
BUN: 21 mg/dL (ref 8–23)
CO2: 24 mmol/L (ref 22–32)
Calcium: 8.3 mg/dL — ABNORMAL LOW (ref 8.9–10.3)
Chloride: 97 mmol/L — ABNORMAL LOW (ref 98–111)
Creatinine, Ser: 1.29 mg/dL — ABNORMAL HIGH (ref 0.61–1.24)
GFR calc Af Amer: >60 mL/min (ref >60)
GFR calc non Af Amer: 54 mL/min — ABNORMAL LOW (ref >60)
Glucose, Bld: 104 mg/dL — ABNORMAL HIGH (ref 70–99)
Potassium: 3.9 mmol/L (ref 3.5–5.1)
Sodium: 132 mmol/L — ABNORMAL LOW (ref 135–145)

## 2019-12-05 LAB — GLUCOSE, CAPILLARY
Glucose-Capillary: 117 mg/dL — ABNORMAL HIGH (ref 70–99)
Glucose-Capillary: 61 mg/dL — ABNORMAL LOW (ref 70–99)
Glucose-Capillary: 111 mg/dL — ABNORMAL HIGH (ref 70–99)
Glucose-Capillary: 295 mg/dL — ABNORMAL HIGH (ref 70–99)
Glucose-Capillary: 82 mg/dL (ref 70–99)
Glucose-Capillary: 85 mg/dL (ref 70–99)
Glucose-Capillary: 133 mg/dL — ABNORMAL HIGH (ref 70–99)

## 2019-12-05 LAB — MAGNESIUM: Magnesium: 1.7 mg/dL (ref 1.7–2.4)

## 2019-12-05 LAB — HEMOGLOBIN A1C
Hgb A1c MFr Bld: 8.1 % — ABNORMAL HIGH (ref 4.8–5.6)
Mean Plasma Glucose: 185.77 mg/dL

## 2019-12-05 LAB — ECHOCARDIOGRAM COMPLETE
Height: 68 in
Weight: 3313.6 oz

## 2019-12-05 LAB — BRAIN NATRIURETIC PEPTIDE: B Natriuretic Peptide: 421 pg/mL — ABNORMAL HIGH (ref 0.0–100.0)

## 2019-12-05 LAB — MRSA PCR SCREENING: MRSA by PCR: NEGATIVE

## 2019-12-05 MED ORDER — METOPROLOL TARTRATE 5 MG/5ML IV SOLN
10.0000 mg | Freq: Once | INTRAVENOUS | Status: AC
  Administered 2019-12-05: 10 mg via INTRAVENOUS
  Filled 2019-12-05: qty 10

## 2019-12-05 MED ORDER — PREDNISONE 10 MG PO TABS
40.0000 mg | ORAL_TABLET | Freq: Every day | ORAL | Status: DC
  Administered 2019-12-05 – 2019-12-07 (×3): 40 mg via ORAL
  Filled 2019-12-05 (×3): qty 4

## 2019-12-05 MED ORDER — METOPROLOL TARTRATE 50 MG PO TABS
100.0000 mg | ORAL_TABLET | Freq: Two times a day (BID) | ORAL | Status: DC
  Administered 2019-12-05 – 2019-12-08 (×7): 100 mg via ORAL
  Filled 2019-12-05 (×7): qty 2

## 2019-12-05 MED ORDER — INSULIN GLARGINE 100 UNIT/ML ~~LOC~~ SOLN
40.0000 [IU] | Freq: Every day | SUBCUTANEOUS | Status: DC
  Administered 2019-12-05 – 2019-12-08 (×4): 40 [IU] via SUBCUTANEOUS
  Filled 2019-12-05 (×5): qty 0.4

## 2019-12-05 MED ORDER — CHLORHEXIDINE GLUCONATE CLOTH 2 % EX PADS
6.0000 | MEDICATED_PAD | Freq: Every day | CUTANEOUS | Status: DC
  Administered 2019-12-05 – 2019-12-07 (×2): 6 via TOPICAL

## 2019-12-05 MED ORDER — SODIUM CHLORIDE 0.9 % IV SOLN
500.0000 mg | INTRAVENOUS | Status: DC
  Administered 2019-12-05 – 2019-12-06 (×2): 500 mg via INTRAVENOUS
  Filled 2019-12-05 (×3): qty 500

## 2019-12-05 MED ORDER — LORAZEPAM 2 MG/ML IJ SOLN: 2.0000 mg | Freq: Once | INTRAMUSCULAR | Status: DC

## 2019-12-05 MED ORDER — FUROSEMIDE 10 MG/ML IJ SOLN
40.0000 mg | INTRAMUSCULAR | Status: AC
  Administered 2019-12-05: 40 mg via INTRAVENOUS
  Filled 2019-12-05: qty 4

## 2019-12-05 MED ORDER — LIDOCAINE 5 % EX PTCH
1.0000 | MEDICATED_PATCH | CUTANEOUS | Status: DC
  Administered 2019-12-06 – 2019-12-08 (×3): 1 via TRANSDERMAL
  Filled 2019-12-05 (×6): qty 1

## 2019-12-05 MED ORDER — METHADONE HCL 10 MG PO TABS
10.0000 mg | ORAL_TABLET | Freq: Once | ORAL | Status: AC
  Administered 2019-12-05: 10 mg via ORAL
  Filled 2019-12-05: qty 1

## 2019-12-05 MED ORDER — PREGABALIN 75 MG PO CAPS
75.0000 mg | ORAL_CAPSULE | Freq: Two times a day (BID) | ORAL | Status: DC
  Administered 2019-12-06 – 2019-12-08 (×6): 75 mg via ORAL
  Filled 2019-12-05 (×7): qty 1

## 2019-12-05 MED ORDER — IPRATROPIUM-ALBUTEROL 0.5-2.5 (3) MG/3ML IN SOLN: 3.0000 mL | Freq: Four times a day (QID) | RESPIRATORY_TRACT | Status: DC | PRN

## 2019-12-05 MED ORDER — MAGNESIUM SULFATE 2 GM/50ML IV SOLN
2.0000 g | Freq: Once | INTRAVENOUS | Status: AC
  Administered 2019-12-05: 2 g via INTRAVENOUS
  Filled 2019-12-05: qty 50

## 2019-12-05 MED ORDER — LORAZEPAM 2 MG/ML IJ SOLN
1.0000 mg | Freq: Once | INTRAMUSCULAR | Status: AC
  Administered 2019-12-05: 1 mg via INTRAVENOUS
  Filled 2019-12-05: qty 1

## 2019-12-05 MED ORDER — ALBUMIN HUMAN 25 % IV SOLN
25.0000 g | Freq: Once | INTRAVENOUS | Status: AC
  Administered 2019-12-05: 25 g via INTRAVENOUS
  Filled 2019-12-05: qty 100

## 2019-12-05 NOTE — Progress Notes (Signed)
*  PRELIMINARY RESULTS* Echocardiogram 2D Echocardiogram has been performed.  Michael Lucas 12/05/2019, 1:08 PM

## 2019-12-05 NOTE — Progress Notes (Signed)
Inpatient Diabetes Program Recommendations  AACE/ADA: New Consensus Statement on Inpatient Glycemic Control (2015)  Target Ranges:  Prepandial:   less than 140 mg/dL      Peak postprandial:   less than 180 mg/dL (1-2 hours)      Critically ill patients:  140 - 180 mg/dL   Lab Results  Component Value Date   GLUCAP 295 (H) 12/05/2019   HGBA1C 8.1 (H) 12/05/2019    Review of Glycemic Control Results for [name], [name] (MRN [id number]) as of 12/05/2019 14:35  Ref. Range 12/05/2019 08:23 12/05/2019 08:54 12/05/2019 11:18  Glucose-Capillary Latest Ref Range: 70 - 99 mg/dL 61 (L) 111 (H) 295 (H)    Diabetes history: DM2 Outpatient Diabetes medications: Lantus 46 units daily; Metformin 500 mg BID Current orders for Inpatient glycemic control: Lantus 46 units daily; Novolog 0-9 TID with meals; Prednisone 40 mg daily  Inpatient Diabetes Program Recommendations:     -Please consider decreasing Lantus 40 units daily  Thank you, Geoffry Paradise, RN, BSN Diabetes Coordinator Inpatient Diabetes Program [phone] (team pager from 8a-5p)

## 2019-12-05 NOTE — Progress Notes (Signed)
While attempting to visit the pt to complete AD edu, pt had a RR event. Upon ch entry pt nurse was at bedside. Pt is is 89 YOM (who has now been transferred to 0000000) here for complications related to PNA/SOB/CHF and is currently in a CPAP. Pt shared that he does not use O2 at home. Pt presents to be weak yet is also recovering from a fall on 1.5.21. Ch will f/u w/ pt dau Verdis Frederickson) regarding pt AD education. Ch encouraged pt to rest for now.      12/05/19 0900  Clinical Encounter Type  Visited With Patient;Health care provider  Visit Type Follow-up;Social support;Critical Care  Referral From Nurse  Consult/Referral To Chaplain  Stress Factors  Patient Stress Factors Exhausted;Health changes  Family Stress Factors None identified

## 2019-12-05 NOTE — Progress Notes (Signed)
RR called, upon entering room pt sat in 70's. This RT noticed that V-60 was not connected to oxygen. Oxygen tubing connected to wall and pt sat increased to low 90's. Pt transitioned to Bubble Bottle HFNC, pt desat to high 80's. ICU Charge Nurse instructed for pt to take slow deep breaths thru his nose, oxygen saturation increased to low-mid 90's. Pt continue to have increased work of breathing. This RT called to ER while second RT in RR remained with pt.

## 2019-12-05 NOTE — Progress Notes (Addendum)
RN at pts bedside during shift change/ pt on continuous CPAP at this time/ alert and stable / CPAP machine alarming /  RN  to beside to assess/ Resp, Bambi, called to make aware of alarming CPAP and requested that she come assess- stated "she was tied up in ER and would come as soon as she can"/ RN continued to stay at bedside- 02 sats  56% on CPAP/ Rapid Response called/ MD called/  Resp, Bambi noticed that CPAP was " not connected to 02"/ connections fixed but pt continues to have lows sats(85% on continuous CPAP)/  Rapid Response team at bedside/ MD again paged to make aware that pt has not improved/ pt will be transferred to CCU/ pts daughter, Verdis Frederickson called to make aware.

## 2019-12-05 NOTE — Telephone Encounter (Signed)
FYI-spoke with the daughter who is very upset with the situation with her dad.  Advised her that we couldn't really do anything about getting him transferred and if she felt that strongly about him moving, she would need to talk to the doctors at the hospital and let them know.  She verbalized understanding and said she would see what could be done at this time. She states she was able to go in and see him last night and was very upset that while in the hospital he had taken a fall.

## 2019-12-05 NOTE — Progress Notes (Signed)
NP notified of pt's O2 sats and low BP. Orders for blood gas, IV albumin, EKG, Stat Labs, cpap and lasix iv. Pt O2 sats improved after cpap placed. VSS.

## 2019-12-05 NOTE — Progress Notes (Signed)
Received call from RN asking to transition pt ot HFNC from CPAP. Informed RN that she could just take him off CPAP and place pt on Bubble Bottle HFNC. RN stated she was not allowed to do that. This RT informed RN that I was in ED and would be up as soon as I could. NO mention about Pt desaturating.

## 2019-12-05 NOTE — Progress Notes (Signed)
PROGRESS NOTE    Michael Lucas  [id number] DOB: 12/04/1945 DOA: 12/20/2019 PCP: Jerrol Banana., MD   Brief Narrative:  Michael Lucas  is a 74 y.o. male with a known history of CAD status post CABG, history of atrial flutter/relation status post ablation 2010, hypertension, gait instability with peripheral neuropathy, depression and chronic CHF diastolic comes to the emergency room with increasing shortness of breath for last couple days. Patient does not use oxygen at baseline. He was found to be in A. fib with RVR and acute on chronic heart failure.  Subjective: Patient had a fall last night.  CT head post fall was negative.  This morning he was becoming more dyspneic and desaturating in 80s and was transferred to stepdown.  Patient was feeling little jittery and anxious when seen during morning rounds.  He was accompanied by his daughter.  Patient and daughter were both concerned about his methadone and Lyrica as patient was taking them for many years.  Apparently they were discontinued after fall last night.  Assessment & Plan:   Active Problems:   Type 2 diabetes mellitus with diabetic polyneuropathy, with long-term current use of insulin (HCC)   CHF, acute on chronic (HCC)  Acute hypoxic respiratory failure secondary to acute on chronic diastolic congestive heart failure. Cardiology was consulted-appreciate their recommendations. Patient appears anxious which is causing increased work of breathing. Rapid response was called when he was found hypoxic in 97s, found a disconnect between his oxygen supply, initially improve after reconnecting to oxygen. Saturation improves when he take deep breaths.  Currently saturating well on bubble bottle HFNC. ABG with PO2 of 58, pH 7.42 and PCO2 was 41. -Continue Lasix 40 mg twice daily. -Continue strict intake and output. -Daily weights.  COPD exacerbation.  Most likely contributory to his hypoxia. -He was placed on azithromycin and  steroid. -Continue with DuoNeb.  A. fib with RVR.  Patient was having A. fib with RVR when seen this morning.  Appears anxious.  He did not received his morning metoprolol as rapid response was called due to hypoxia. -Give him one-time dose of metoprolol 10 mg IV. -Ativan 1 mg one-time dose for anxiety. -Continue home dose of metoprolol and Xarelto.  Acute encephalopathy.  Patient is more confused than his baseline.  CT head obtained after fall last night was negative for any bleed. -Brain MRI.  Type 2 diabetes.  Uncontrolled with A1c of 8.1. CBG within normal range.  Having couple of episodes of hypoglycemia. -Decrease Lantus to 40 units at bedtime. -Continue with sensitive sliding scale.  CAD.  Denies any chest pain.  Troponin mildly elevated most likely secondary to demand. -Continue statin.  Chronic pain.  Patient has an history of chronic back pain and taking methadone and Lyrica at home for many years.  [facility] -Continue home dose of methadone. -Decrease Lyrica to 75 mg twice daily from 150 mg of home dose because of concern of fall and unsteadiness.  History of depression. Continue home dose of Seroquel and Zoloft.  Hypothyroidism. Continue home dose of Synthroid.  Objective: Vitals:   12/05/19 1200 12/05/19 1300 12/05/19 1356 12/05/19 1400  BP: 117/60 (!) 145/112  (!) 137/117  Pulse: (!) 121 (!) 104 (!) 128 (!) 129  Resp: 20 20 (!) 21 18  Temp:      TempSrc:      SpO2: 94% 97% 92% 90%  Weight:      Height:        Intake/Output  Summary (Last 24 hours) at 12/05/2019 1508 Last data filed at 12/05/2019 1156 Gross per 24 hour  Intake 351.94 ml  Output 1550 ml  Net -1198.06 ml   Filed Weights   12/12/2019 1255 12/19/2019 1730  Weight: 94.8 kg 93.9 kg    Examination:  General exam: Anxious looking elderly man, in no acute distress. Respiratory system: Few scattered rhonchi, respiratory effort normal. Cardiovascular system: Irregularly irregular with  tachycardia. Gastrointestinal system: Soft, nontender, nondistended, bowel sounds positive. Central nervous system: Alert and oriented. No focal neurological deficits. Extremities: No edema, no cyanosis, pulses intact and symmetrical. Skin: No rashes, lesions or ulcers Psychiatry: Judgement and insight appear mildly impaired.  DVT prophylaxis: Xarelto Code Status: Full Family Communication: Daughter was updated at bedside. Disposition Plan: Pending improvement.  Consultants:   Cardiology  Procedures:  Antimicrobials:  Azithromycin  Data Reviewed: I have personally reviewed following labs and imaging studies  CBC: Recent Labs  Lab 12/19/2019 1252 12/05/19 0313  WBC 10.7* 11.6*  NEUTROABS 8.9*  --   HGB 11.5* 10.4*  HCT 36.2* 31.4*  MCV 87.7 86.3  PLT 297 [id number]   Basic Metabolic Panel: Recent Labs  Lab 11/30/2019 1252 12/05/19 0313  NA 131* 132*  K 3.7 3.9  CL 96* 97*  CO2 21* 24  GLUCOSE 179* 104*  BUN 18 21  CREATININE 1.24 1.29*  CALCIUM 8.8* 8.3*  MG  --  1.7   GFR: Estimated Creatinine Clearance: 55.9 mL/min (A) (by C-G formula based on SCr of 1.29 mg/dL (H)). Liver Function Tests: Recent Labs  Lab 12/29/2019 1252  AST 38  ALT 28  ALKPHOS 146*  BILITOT 0.9  PROT 7.8  ALBUMIN 3.6   No results for input(s): LIPASE, AMYLASE in the last 168 hours. No results for input(s): AMMONIA in the last 168 hours. Coagulation Profile: No results for input(s): INR, PROTIME in the last 168 hours. Cardiac Enzymes: No results for input(s): CKTOTAL, CKMB, CKMBINDEX, TROPONINI in the last 168 hours. BNP (last 3 results) No results for input(s): PROBNP in the last 8760 hours. HbA1C: Recent Labs    12/25/2019 2134  HGBA1C 8.1*   CBG: Recent Labs  Lab 12/01/2019 2216 12/05/19 0215 12/05/19 0823 12/05/19 0854 12/05/19 1118  GLUCAP 169* 117* 61* 111* 295*   Lipid Profile: No results for input(s): CHOL, HDL, LDLCALC, TRIG, CHOLHDL, LDLDIRECT in the last 72  hours. Thyroid Function Tests: No results for input(s): TSH, T4TOTAL, FREET4, T3FREE, THYROIDAB in the last 72 hours. Anemia Panel: No results for input(s): VITAMINB12, FOLATE, FERRITIN, TIBC, IRON, RETICCTPCT in the last 72 hours. Sepsis Labs: Recent Labs  Lab 12/03/2019 1252  PROCALCITON 0.21  LATICACIDVEN 1.2    Recent Results (from the past 240 hour(s))  Blood Culture (routine x 2)     Status: None (Preliminary result)   Collection Time: 12/28/2019 12:52 PM   Specimen: BLOOD  Result Value Ref Range Status   Specimen Description BLOOD BLOOD LEFT HAND  Final   Special Requests   Final    BOTTLES DRAWN AEROBIC AND ANAEROBIC Blood Culture results may not be optimal due to an excessive volume of blood received in culture bottles   Culture   Final    NO GROWTH < 24 HOURS Performed at [facility], [address] [place], [place] [address]    Report Status PENDING  Incomplete  Blood Culture (routine x 2)     Status: None (Preliminary result)   Collection Time: 12/24/2019 12:57 PM   Specimen: BLOOD  Result Value Ref Range Status   Specimen Description BLOOD LEFT ANTECUBITAL  Final   Special Requests   Final    BOTTLES DRAWN AEROBIC AND ANAEROBIC Blood Culture adequate volume   Culture   Final    NO GROWTH < 24 HOURS Performed at [facility], [address] [place], [place] [address]    Report Status PENDING  Incomplete  Respiratory Panel by RT PCR (Flu A&B, Covid) - Nasopharyngeal Swab     Status: None   Collection Time: 12/30/2019  3:45 PM   Specimen: Nasopharyngeal Swab  Result Value Ref Range Status   SARS Coronavirus 2 by RT PCR NEGATIVE NEGATIVE Final    Comment: (NOTE) SARS-CoV-2 target nucleic acids are NOT DETECTED. The SARS-CoV-2 RNA is generally detectable in upper respiratoy specimens during the acute phase of infection. The lowest concentration of SARS-CoV-2 viral copies this assay can detect is 131 copies/mL. A negative result does not preclude  SARS-Cov-2 infection and should not be used as the sole basis for treatment or other patient management decisions. A negative result may occur with  improper specimen collection/handling, submission of specimen other than nasopharyngeal swab, presence of viral mutation(s) within the areas targeted by this assay, and inadequate number of viral copies (<131 copies/mL). A negative result must be combined with clinical observations, patient history, and epidemiological information. The expected result is Negative. Fact Sheet for Patients:  PinkCheek.be Fact Sheet for Healthcare Providers:  GravelBags.it This test is not yet ap proved or cleared by the [place] FDA and  has been authorized for detection and/or diagnosis of SARS-CoV-2 by FDA under an Emergency Use Authorization (EUA). This EUA will remain  in effect (meaning this test can be used) for the duration of the COVID-19 declaration under Section 564(b)(1) of the Act, 21 U.S.C. section 360bbb-3(b)(1), unless the authorization is terminated or revoked sooner.    Influenza A by PCR NEGATIVE NEGATIVE Final   Influenza B by PCR NEGATIVE NEGATIVE Final    Comment: (NOTE) The Xpert Xpress SARS-CoV-2/FLU/RSV assay is intended as an aid in  the diagnosis of influenza from Nasopharyngeal swab specimens and  should not be used as a sole basis for treatment. Nasal washings and  aspirates are unacceptable for Xpert Xpress SARS-CoV-2/FLU/RSV  testing. Fact Sheet for Patients: PinkCheek.be Fact Sheet for Healthcare Providers: GravelBags.it This test is not yet approved or cleared by the [place] FDA and  has been authorized for detection and/or diagnosis of SARS-CoV-2 by  FDA under an Emergency Use Authorization (EUA). This EUA will remain  in effect (meaning this test can be used) for the duration of the  Covid-19  declaration under Section 564(b)(1) of the Act, 21  U.S.C. section 360bbb-3(b)(1), unless the authorization is  terminated or revoked. Performed at [facility], [address] [place], [place] [address]      Radiology Studies: CT HEAD WO CONTRAST  Result Date: 12/21/2019 CLINICAL DATA:  Headaches EXAM: CT HEAD WITHOUT CONTRAST TECHNIQUE: Contiguous axial images were obtained from the base of the skull through the vertex without intravenous contrast. COMPARISON:  10/24/2013 FINDINGS: Brain: Mild atrophic changes and chronic white matter ischemic changes are seen. No findings to suggest acute hemorrhage, acute infarction or space-occupying mass lesion are noted. Vascular: No hyperdense vessel or unexpected calcification. Skull: Normal. Negative for fracture or focal lesion. Sinuses/Orbits: No acute finding. Other: None. IMPRESSION: Chronic atrophic and ischemic changes without acute abnormality. Electronically Signed   By: Linus Mako.D.  On: 12/25/2019 20:46   CT ANGIO CHEST PE W OR WO CONTRAST  Result Date: 12/15/2019 CLINICAL DATA:  Hypoxemia EXAM: CT ANGIOGRAPHY CHEST WITH CONTRAST TECHNIQUE: Multidetector CT imaging of the chest was performed using the standard protocol during bolus administration of intravenous contrast. Multiplanar CT image reconstructions and MIPs were obtained to evaluate the vascular anatomy. CONTRAST:  75mL OMNIPAQUE IOHEXOL 350 MG/ML SOLN COMPARISON:  Chest x-ray from earlier in the same day. FINDINGS: Cardiovascular: Thoracic aorta demonstrates atherosclerotic calcifications without aneurysmal dilatation or dissection. Coronary calcifications are seen. No cardiac enlargement is seen. No pericardial effusion is noted. The pulmonary artery shows a normal branching pattern without intraluminal filling defect to suggest pulmonary embolism. Mediastinum/Nodes: Thoracic inlet is within normal limits. Scattered mediastinal adenopathy is noted most prominent in the  region of the right paratracheal stripe. A dominant node is seen measuring 2 cm in short axis. Scattered smaller mediastinal nodes are seen. Subcarinal lymph node measures almost 18 mm in short axis. Small hilar lymph nodes are seen. These changes are increased from a prior CT from 2010. The esophagus is within normal limits. Lungs/Pleura: Some fibrotic changes are noted in the lungs bilaterally although diffuse ground-glass opacities are seen with some interstitial thickening most consistent with atypical or viral pneumonia. Mild superimposed edema is likely present as well. No sizable effusion is noted. Upper Abdomen: Visualized upper abdomen is within normal limits. Musculoskeletal: Degenerative changes of the thoracic spine are seen. No acute rib abnormality is noted. Review of the MIP images confirms the above findings. IMPRESSION: No evidence of pulmonary emboli. Patchy ground-glass infiltrates with interstitial thickening likely related to patchy edema. Patient has a negative COVID-19 test. Atypical pneumonia may also be superimposed as well. Mediastinal and hilar adenopathy likely reactive given the appearance of the lungs. Aortic Atherosclerosis (ICD10-I70.0) and Emphysema (ICD10-J43.9). Electronically Signed   By: Inez Catalina M.D.   On: 12/18/2019 20:53   DG Chest Port 1 View  Result Date: 12/19/2019 CLINICAL DATA:  Shortness of breath. EXAM: PORTABLE CHEST 1 VIEW COMPARISON:  Chest x-ray 10/24/2013. FINDINGS: Prior median sternotomy. Borderline cardiomegaly. Diffuse severe bilateral pulmonary infiltrates. No pleural effusion or pneumothorax. No acute bony abnormality. IMPRESSION: 1. Diffuse severe bilateral pulmonary infiltrates/edema. Bilateral multifocal pneumonia and or pulmonary edema could present this fashion. 2.  Prior median sternotomy.  Borderline cardiomegaly. Electronically Signed   By: Marcello Moores  Register   On: 12/03/2019 13:21    Scheduled Meds: . atorvastatin  80 mg Oral QPM  .  Chlorhexidine Gluconate Cloth  6 each Topical Daily  . cholecalciferol  1,000 Units Oral Daily  . furosemide  40 mg Intravenous BID  . influenza vaccine adjuvanted  0.5 mL Intramuscular Tomorrow-1000  . insulin aspart  0-9 Units Subcutaneous TID WC  . insulin glargine  40 Units Subcutaneous QHS  . levothyroxine  50 mcg Oral QAC breakfast  . methadone  10 mg Oral BID  . metoprolol tartrate  100 mg Oral BID  . prazosin  6 mg Oral QHS  . predniSONE  40 mg Oral Q breakfast  . pregabalin  75 mg Oral BID  . QUEtiapine  50 mg Oral QHS  . rivaroxaban  20 mg Oral Q supper  . sertraline  100 mg Oral Daily  . sodium chloride flush  3 mL Intravenous Q12H   Continuous Infusions: . sodium chloride    . azithromycin 500 mg (12/05/19 1025)     LOS: 1 day   Time spent: 45 minutes  Lorella Nimrod, MD  [facility] Pager [phone]  If 7PM-7AM, please contact night-coverage [email or web address] Password [facility] 12/05/2019, 3:08 PM   This record has been created using Dragon voice recognition software. Errors have been sought and corrected,but may not always be located. Such creation errors do not reflect on the standard of care.

## 2019-12-05 NOTE — Progress Notes (Signed)
PT Cancellation Note  Patient Details Name: Alvan Erstad MRN: [id number] DOB: 1946/07/08   Cancelled Treatment:    Reason Eval/Treat Not Completed: Patient not medically ready. Patient transferred to CCU since order placed this morning. Will await new orders when patient appropriate for PT evaluation.    Maricela Schreur 12/05/2019, 11:15 AM

## 2019-12-05 NOTE — Progress Notes (Signed)
Progress Note  Patient Name: Niven Cappuccio Date of Encounter: 12/05/2019  Primary Cardiologist: Kathlyn Sacramento, MD  Subjective   Pt fell last night.  Head CT neg for bleed.  This AM, he developed resp distress and hypoxia and was moved to ICU.  He remains on CPAP this AM.  Denies c/p.  Inpatient Medications    Scheduled Meds:  atorvastatin  80 mg Oral QPM   Chlorhexidine Gluconate Cloth  6 each Topical Daily   cholecalciferol  1,000 Units Oral Daily   furosemide  40 mg Intravenous BID   influenza vaccine adjuvanted  0.5 mL Intramuscular Tomorrow-1000   insulin aspart  0-9 Units Subcutaneous TID WC   insulin glargine  46 Units Subcutaneous QHS   levothyroxine  50 mcg Oral QAC breakfast   methadone  10 mg Oral BID   methadone  10 mg Oral Once   metoprolol tartrate  100 mg Oral BID   prazosin  6 mg Oral QHS   predniSONE  40 mg Oral Q breakfast   pregabalin  75 mg Oral BID   QUEtiapine  50 mg Oral QHS   rivaroxaban  20 mg Oral Q supper   sertraline  100 mg Oral Daily   sodium chloride flush  3 mL Intravenous Q12H   Continuous Infusions:  sodium chloride     azithromycin 500 mg (12/05/19 1025)   PRN Meds: sodium chloride, acetaminophen, ipratropium-albuterol, ondansetron (ZOFRAN) IV, sodium chloride flush   Vital Signs    Vitals:   12/05/19 0932 12/05/19 1000 12/05/19 1042 12/05/19 1100  BP:  117/71  111/64  Pulse:  (!) 106 (!) 137 (!) 121  Resp:  (!) 22 (!) 22 (!) 21  Temp:      TempSrc:      SpO2: 90% 91% 92% 91%  Weight:      Height:        Intake/Output Summary (Last 24 hours) at 12/05/2019 1246 Last data filed at 12/05/2019 1156 Gross per 24 hour  Intake 351.94 ml  Output 1550 ml  Net -1198.06 ml   Filed Weights   12/19/2019 1255 12/19/2019 1730  Weight: 94.8 kg 93.9 kg    Physical Exam   GEN: Well nourished, well developed, in no acute distress.  HEENT: Grossly normal.  Neck: Supple, no JVD, carotid bruits, or masses. Cardiac: IR,  IR, distant, no murmurs, rubs, or gallops. No clubbing, cyanosis, edema.  Radials/DP/PT 1+ and equal bilaterally.  Respiratory:  Respirations regular and unlabored, scattered rhonchi bilaterally. GI: Soft, nontender, nondistended, BS + x 4. MS: no deformity or atrophy. Skin: warm and dry, no rash. Neuro:  Strength and sensation are intact. Psych: Groggy but awakens with loud verbal stimuli.  Flat affect.  Labs    Chemistry Recent Labs  Lab 12/12/2019 1252 12/05/19 0313  NA 131* 132*  K 3.7 3.9  CL 96* 97*  CO2 21* 24  GLUCOSE 179* 104*  BUN 18 21  CREATININE 1.24 1.29*  CALCIUM 8.8* 8.3*  PROT 7.8  --   ALBUMIN 3.6  --   AST 38  --   ALT 28  --   ALKPHOS 146*  --   BILITOT 0.9  --   GFRNONAA 57* 54*  GFRAA >60 >60  ANIONGAP 14 11     Hematology Recent Labs  Lab 12/12/2019 1252 12/05/19 0313  WBC 10.7* 11.6*  RBC 4.13* 3.64*  HGB 11.5* 10.4*  HCT 36.2* 31.4*  MCV 87.7 86.3  MCH 27.8 28.6  MCHC  31.8 33.1  RDW 13.3 13.2  PLT 297 260    Cardiac Enzymes  Recent Labs  Lab 12/05/19 0313 12/05/19 0619 12/05/19 0810  TROPONINIHS 87* 89* 95*      BNP Recent Labs  Lab 12/02/2019 1252 12/05/19 0313  BNP 276.0* 421.0*      Radiology    CT HEAD WO CONTRAST  Result Date: 12/01/2019 CLINICAL DATA:  Headaches EXAM: CT HEAD WITHOUT CONTRAST TECHNIQUE: Contiguous axial images were obtained from the base of the skull through the vertex without intravenous contrast. COMPARISON:  10/24/2013 FINDINGS: Brain: Mild atrophic changes and chronic white matter ischemic changes are seen. No findings to suggest acute hemorrhage, acute infarction or space-occupying mass lesion are noted. Vascular: No hyperdense vessel or unexpected calcification. Skull: Normal. Negative for fracture or focal lesion. Sinuses/Orbits: No acute finding. Other: None. IMPRESSION: Chronic atrophic and ischemic changes without acute abnormality. Electronically Signed   By: Inez Catalina M.D.   On: 12/13/2019  20:46   CT ANGIO CHEST PE W OR WO CONTRAST  Result Date: 12/21/2019 CLINICAL DATA:  Hypoxemia EXAM: CT ANGIOGRAPHY CHEST WITH CONTRAST TECHNIQUE: Multidetector CT imaging of the chest was performed using the standard protocol during bolus administration of intravenous contrast. Multiplanar CT image reconstructions and MIPs were obtained to evaluate the vascular anatomy. CONTRAST:  56mL OMNIPAQUE IOHEXOL 350 MG/ML SOLN COMPARISON:  Chest x-ray from earlier in the same day. FINDINGS: Cardiovascular: Thoracic aorta demonstrates atherosclerotic calcifications without aneurysmal dilatation or dissection. Coronary calcifications are seen. No cardiac enlargement is seen. No pericardial effusion is noted. The pulmonary artery shows a normal branching pattern without intraluminal filling defect to suggest pulmonary embolism. Mediastinum/Nodes: Thoracic inlet is within normal limits. Scattered mediastinal adenopathy is noted most prominent in the region of the right paratracheal stripe. A dominant node is seen measuring 2 cm in short axis. Scattered smaller mediastinal nodes are seen. Subcarinal lymph node measures almost 18 mm in short axis. Small hilar lymph nodes are seen. These changes are increased from a prior CT from 2010. The esophagus is within normal limits. Lungs/Pleura: Some fibrotic changes are noted in the lungs bilaterally although diffuse ground-glass opacities are seen with some interstitial thickening most consistent with atypical or viral pneumonia. Mild superimposed edema is likely present as well. No sizable effusion is noted. Upper Abdomen: Visualized upper abdomen is within normal limits. Musculoskeletal: Degenerative changes of the thoracic spine are seen. No acute rib abnormality is noted. Review of the MIP images confirms the above findings. IMPRESSION: No evidence of pulmonary emboli. Patchy ground-glass infiltrates with interstitial thickening likely related to patchy edema. Patient has a  negative COVID-19 test. Atypical pneumonia may also be superimposed as well. Mediastinal and hilar adenopathy likely reactive given the appearance of the lungs. Aortic Atherosclerosis (ICD10-I70.0) and Emphysema (ICD10-J43.9). Electronically Signed   By: Inez Catalina M.D.   On: 12/29/2019 20:53   DG Chest Port 1 View  Result Date: 12/26/2019 CLINICAL DATA:  Shortness of breath. EXAM: PORTABLE CHEST 1 VIEW COMPARISON:  Chest x-ray 10/24/2013. FINDINGS: Prior median sternotomy. Borderline cardiomegaly. Diffuse severe bilateral pulmonary infiltrates. No pleural effusion or pneumothorax. No acute bony abnormality. IMPRESSION: 1. Diffuse severe bilateral pulmonary infiltrates/edema. Bilateral multifocal pneumonia and or pulmonary edema could present this fashion. 2.  Prior median sternotomy.  Borderline cardiomegaly. Electronically Signed   By: Marcello Moores  Register   On: 12/16/2019 13:21    Telemetry    AFib, 90's - Personally Reviewed  Cardiac Studies   Echo pending this AM.  Patient Profile   74 y.o. male with a hx of CAD status post one-vessel CABG in 1995 with LIMA to LAD, atrial flutter status post ablation in 2010, chronic A. fib previously on Coumadin though discontinued in 2011 secondary to frequent falls now on Xarelto and tolerating, HFpEF, pulmonary hypertension, CKD stage III, HTN, HLD, gait instability with peripheral neuropathy, mild nonobstructive bilateral carotid artery disease, and OSA intolerant to CPAP who was admitted 1/5 due to resp failure and acute on chronic HFpEF.  Assessment & Plan    1.  Acute hypoxic resp failure/Lingular PNA:  Pt presented 1/5 due to dyspnea and hypoxia w/ sats in the 70's on RA.  Here, CXR concerning for possible multifocal PNA w/ some degree of edema.  Covid neg.  Admitted and placed on IV lasix and abx.  He developed worsening hypoxia this AM and was tx to ICU.  Remains on CPAP.  Scattered rhonchi on exam.  No evidence of significant volume overload.  Steroids/abx per IM.  Cont IV lasix for now given edema noted on chest CT.  2.  Acute on chronic HFpEF:  Felt to have mild volume overload on admission.  Minus 1.1L overnight.  Renal fxn stable.  Assuming accuracy, current wt is below dry wt.  HRs elevated earlier, into the 130's in the setting of resp distress, currently in the 90's.  BP stable.  Cont IV lasix today given patchy edema on CT last night. Cont  blocker.  3.  Permanent Afib:  Rates up earlier in the setting of resp distress.  Now trending in the 90's.  Cont  blocker.  Anticoagulation w/ xarelto (CHA2DS2VASc = 4).  4.  CKD III:  Stable.  Follow w/ diuresis.  5.  Fall:  Fell last night and hit his head. CT head neg for bleed.  May need to reconsider [place] strategy if falling freq as outpt.  6.  CAD/elevated troponin:  S/p prior CABG.  No chest pain. HsTrop mildly elevated in the setting of above w/ flat trend 87  89  95.  Cont  blocker, statin.  No ASA in setting of xarelto. Echo currently being done.  Would consider isch eval if EF down.   7.  HL:  LDL 43 in 09/2018.  Cont statin.  Signed, Murray Hodgkins, NP  12/05/2019, 12:46 PM    For questions or updates, please contact   Please consult [email or web address] for contact info under Cardiology/STEMI.

## 2019-12-06 ENCOUNTER — Inpatient Hospital Stay: Payer: Medicare Other

## 2019-12-06 DIAGNOSIS — I4811 Longstanding persistent atrial fibrillation: Secondary | ICD-10-CM

## 2019-12-06 LAB — CBC
HCT: 33 % — ABNORMAL LOW (ref 39.0–52.0)
Hemoglobin: 10.7 g/dL — ABNORMAL LOW (ref 13.0–17.0)
MCH: 28.1 pg (ref 26.0–34.0)
MCHC: 32.4 g/dL (ref 30.0–36.0)
MCV: 86.6 fL (ref 80.0–100.0)
Platelets: 270 10*3/uL (ref 150–400)
RBC: 3.81 MIL/uL — ABNORMAL LOW (ref 4.22–5.81)
RDW: 13.2 % (ref 11.5–15.5)
WBC: 12.6 10*3/uL — ABNORMAL HIGH (ref 4.0–10.5)
nRBC: 0 % (ref 0.0–0.2)

## 2019-12-06 LAB — THYROID PANEL WITH TSH
Free Thyroxine Index: 1.9 (ref 1.2–4.9)
T3 Uptake Ratio: 34 % (ref 24–39)
T4, Total: 5.5 ug/dL (ref 4.5–12.0)
TSH: 4.75 u[IU]/mL — ABNORMAL HIGH (ref 0.450–4.500)

## 2019-12-06 LAB — BASIC METABOLIC PANEL
Anion gap: 11 (ref 5–15)
BUN: 27 mg/dL — ABNORMAL HIGH (ref 8–23)
CO2: 27 mmol/L (ref 22–32)
Calcium: 8.3 mg/dL — ABNORMAL LOW (ref 8.9–10.3)
Chloride: 95 mmol/L — ABNORMAL LOW (ref 98–111)
Creatinine, Ser: 1.35 mg/dL — ABNORMAL HIGH (ref 0.61–1.24)
GFR calc Af Amer: 60 mL/min — ABNORMAL LOW (ref >60)
GFR calc non Af Amer: 51 mL/min — ABNORMAL LOW (ref >60)
Glucose, Bld: 165 mg/dL — ABNORMAL HIGH (ref 70–99)
Potassium: 3.8 mmol/L (ref 3.5–5.1)
Sodium: 133 mmol/L — ABNORMAL LOW (ref 135–145)

## 2019-12-06 LAB — GLUCOSE, CAPILLARY
Glucose-Capillary: 171 mg/dL — ABNORMAL HIGH (ref 70–99)
Glucose-Capillary: 420 mg/dL — ABNORMAL HIGH (ref 70–99)
Glucose-Capillary: 410 mg/dL — ABNORMAL HIGH (ref 70–99)
Glucose-Capillary: 398 mg/dL — ABNORMAL HIGH (ref 70–99)
Glucose-Capillary: 253 mg/dL — ABNORMAL HIGH (ref 70–99)

## 2019-12-06 MED ORDER — GLUCERNA PO LIQD
237.0000 mL | Freq: Every morning | ORAL | Status: DC
  Administered 2019-12-07: 237 mL via ORAL
  Filled 2019-12-06: qty 237

## 2019-12-06 MED ORDER — INSULIN ASPART 100 UNIT/ML ~~LOC~~ SOLN
0.0000 [IU] | Freq: Three times a day (TID) | SUBCUTANEOUS | Status: DC
  Administered 2019-12-06: 20 [IU] via SUBCUTANEOUS
  Administered 2019-12-07: 4 [IU] via SUBCUTANEOUS
  Administered 2019-12-07: 11 [IU] via SUBCUTANEOUS
  Administered 2019-12-08 (×3): 7 [IU] via SUBCUTANEOUS
  Administered 2019-12-09: 11 [IU] via SUBCUTANEOUS
  Filled 2019-12-06 (×6): qty 1

## 2019-12-06 MED ORDER — INSULIN ASPART 100 UNIT/ML ~~LOC~~ SOLN
0.0000 [IU] | Freq: Every day | SUBCUTANEOUS | Status: DC
  Administered 2019-12-06 – 2019-12-08 (×3): 3 [IU] via SUBCUTANEOUS
  Filled 2019-12-06 (×3): qty 1

## 2019-12-06 MED ORDER — PRAZOSIN HCL 5 MG PO CAPS
6.0000 mg | ORAL_CAPSULE | Freq: Every day | ORAL | Status: DC
  Administered 2019-12-06 – 2019-12-09 (×3): 6 mg via ORAL
  Filled 2019-12-06 (×3): qty 1

## 2019-12-06 MED ORDER — FUROSEMIDE 10 MG/ML IJ SOLN
40.0000 mg | Freq: Every day | INTRAMUSCULAR | Status: DC
  Filled 2019-12-06: qty 4

## 2019-12-06 MED ORDER — QUETIAPINE FUMARATE 25 MG PO TABS
50.0000 mg | ORAL_TABLET | Freq: Every day | ORAL | Status: DC
  Administered 2019-12-07 – 2019-12-09 (×3): 50 mg via ORAL
  Filled 2019-12-06 (×3): qty 2

## 2019-12-06 MED ORDER — DOCUSATE SODIUM 100 MG PO CAPS
200.0000 mg | ORAL_CAPSULE | Freq: Every day | ORAL | Status: DC
  Administered 2019-12-06 – 2019-12-08 (×3): 200 mg via ORAL
  Filled 2019-12-06 (×3): qty 2

## 2019-12-06 NOTE — Progress Notes (Signed)
Ch f/u with pt who shared that he was doing a lot better with his breathing. Pt was able to tolerate eating apple sauce as witnessed by ch. Ch spoke later with pt's dau who c/o the care that the pt received around 1700 on 1.7.21. Ch was aware that the pt has suffered a traumatic event while hospitalized her 11 years ago as reported by the dau. Dau also presence to still be grieving the loss of her mom (pt's wife) that passed away in 03/16/1990. Ch allowed space for the pt's dau to express her concerns and frustrations with the care that the pt was receiving but also informed her that if a transfer to another facility was put in place, the pt would need to be stabilized to endure the transfer. Pt is able to conversate and is and moderately a/o. Ch has witnessed pt having moments of being lethargic and lucid. Ch encouraged pt dau to hold off from returning to work so that she could be bedside and communicate with staff until pt has improved. Pt and dau were appreciative of f/u visit.    12/06/19 1400  Clinical Encounter Type  Visited With Patient and family together;Health care provider  Visit Type Follow-up;Spiritual support;Social support;Critical Care  Stress Factors  Patient Stress Factors Health changes;Lack of knowledge  Family Stress Factors Loss;Loss of control

## 2019-12-06 NOTE — TOC Initial Note (Signed)
Transition of Care [facility]) - Initial/Assessment Note    Patient Details  Name: Michael Lucas MRN: [id number] Date of Birth: July 06, 1946  [facility] CM/SW Contact:    Shelbie Ammons, RN Phone Number: 12/06/2019, 1:40 PM  Clinical Narrative:        RNCM assessment completed via telephone with daughter due to patient eating. Patient lives at home in single family home. Daughter is primary caregiver and reports she will be here as long as he needs her. Patient receives in home services through the [place] and daughter reports this is really all they need. Encouraged daughter to reach out to [facility] or notify bedside nurse should anything else come up.         Expected Discharge Plan: [place] Barriers to Discharge: Continued Medical Work up   Patient Goals and CMS Choice Patient states their goals for this hospitalization and ongoing recovery are:: per daughter to get him back home and feeling better      Expected Discharge Plan and Services Expected Discharge Plan: [place]   Discharge Planning Services: CM Consult Post Acute Care Choice: [place] arrangements for the past 2 months: [place] Agency[facility]        Prior Living Arrangements/Services Living arrangements for the past 2 months: Single Family Home Lives with:: Self Patient language and need for interpreter reviewed:: Yes Do you feel safe going back to the place where you live?: Yes      Need for Family Participation in Patient Care: Yes (Comment) Care giver support system in place?: Yes (comment) Current home services: Home PT, Home RN Criminal Activity/Legal Involvement Pertinent to Current Situation/Hospitalization: No - Comment as needed  Activities of Daily Living Home Assistive Devices/Equipment: Eyeglasses ADL Screening (condition at time of admission) Patient's cognitive ability  adequate to safely complete daily activities?: Yes Is the patient deaf or have difficulty hearing?: No Does the patient have difficulty seeing, even when wearing glasses/contacts?: No Does the patient have difficulty concentrating, remembering, or making decisions?: No Patient able to express need for assistance with ADLs?: Yes Does the patient have difficulty dressing or bathing?: No Independently performs ADLs?: Yes (appropriate for developmental age) Does the patient have difficulty walking or climbing stairs?: No Weakness of Legs: Both Weakness of Arms/Hands: Both  Permission Sought/Granted                  Emotional Assessment Appearance:: (Daughter assessed by phone due to patient being in CCU) Attitude/Demeanor/Rapport: Engaged   Orientation: : Oriented to Self, Oriented to Place, Oriented to  Time, Oriented to Situation   Psych Involvement: No (comment)  Admission diagnosis:  Acute pulmonary edema ([place]) [J81.0] Acute respiratory failure with hypoxia ([place]) [J96.01] CHF, acute on chronic ([place]) [I50.9] Patient Active Problem List   Diagnosis Date Noted  . CHF, acute on chronic ([place]) 12/29/2019  . Acute on chronic respiratory failure ([place])   . Acute pulmonary edema (HCC)   . Tendinitis of right wrist 01/25/2017  . Hypertensive heart disease   . Chronic diastolic CHF (congestive heart failure) ([place])   . Paroxysmal atrial fibrillation (HCC)   . CKD (chronic kidney disease), stage III   . Coronary artery disease   . Absolute anemia 04/03/2015  . Arthritis  04/03/2015  . Chronic pain associated with significant psychosocial dysfunction 04/03/2015  . Constipation 04/03/2015  . Coronary artery abnormality 04/03/2015  . Type 2 diabetes mellitus with diabetic polyneuropathy, with long-term current use of insulin ([place]) 04/03/2015  . Essential (primary) hypertension 04/03/2015  . Broken fibula 04/03/2015  . Acid reflux 04/03/2015  . Adult hypothyroidism 04/03/2015  . Acute  kidney failure ([place]) 04/03/2015  . Calculus of kidney 04/03/2015  . Neuropathy 04/03/2015  . Arthritis, degenerative 04/03/2015  . 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 AF (paroxysmal atrial fibrillation) ([place]) 04/03/2015  . Decreased potassium in the blood 04/03/2015  . Neurosis, posttraumatic 04/03/2015  . Contusion of chest wall 04/03/2015  . Dermatitis seborrheica 04/03/2015  . Episode of syncope 04/03/2015  . Malaise 04/03/2015  . Clinical depression 04/03/2015  . Bilateral carotid artery stenosis 02/05/2014  . Orthostatic hypotension 06/02/2012  . Carotid bruit 11/05/2011  . Hyperlipidemia 04/10/2011  . UNSTEADY GAIT 06/24/2010  . DIZZINESS 01/21/2010  . ATRIAL FIBRILLATION 09/08/2009  . OCCLUSION&STENOS CAROTID ART W/O MENTION INFARCT 09/08/2009  . DIASTOLIC HEART FAILURE, CHRONIC 08/12/2009  . EDEMA 08/12/2009  . Essential hypertension, benign 06/16/2009  . CAD, ARTERY BYPASS GRAFT 06/16/2009  . ATRIAL FLUTTER 06/16/2009  . LEG PAIN, BILATERAL 06/16/2009   PCP:  Jerrol Banana., MD Pharmacy:   [facility], [address] [place] [address] Phone: [phone] Fax: [phone]  [facility] [place], [place] [address] [place] [address] Phone: [phone] Fax: [phone]     Social Determinants of Health ([place]) Interventions    Readmission Risk Interventions Readmission Risk Prevention Plan 12/06/2019  Transportation Screening Complete  PCP or Specialist Appt within 3-5 Days Complete  Palliative Care Screening Not Applicable  Medication Review (RN Care Manager) Complete  Some recent data might be hidden

## 2019-12-06 NOTE — Progress Notes (Signed)
PROGRESS NOTE    Michael Lucas  [id number] DOB: 1946/08/10 DOA: 12/25/2019 PCP: Jerrol Banana., MD   Brief Narrative:  Michael Lucas  is a 74 y.o. male with a known history of CAD status post CABG, history of atrial flutter/relation status post ablation 2010, hypertension, gait instability with peripheral neuropathy, depression and chronic CHF diastolic comes to the emergency room with increasing shortness of breath for last couple days. Patient does not use oxygen at baseline. He was found to be in A. fib with RVR and acute on chronic heart failure.  Subjective: Patient was feeling better when seen this morning.  His breathing status was improved and he was able to maintain saturation on nonrebreather.  He did eat his breakfast.  He was accompanied by his daughter in the room.  Assessment & Plan:   Principal Problem:   Acute on chronic respiratory failure (HCC) Active Problems:   ATRIAL FIBRILLATION   Type 2 diabetes mellitus with diabetic polyneuropathy, with long-term current use of insulin (HCC)  Acute hypoxic respiratory failure secondary to acute on chronic diastolic congestive heart failure. Cardiology was consulted-appreciate their recommendations. Echo done yesterday and exam are not consistent with volume overload. Patient is off the BiPAP now but requiring higher oxygen. -Decrease Lasix to 40 mg daily as there is a bump in his creatinine. -Continue strict intake and output. -Daily weights. -Pulmonary was consulted at the request of cardiology.  COPD exacerbation.  Most likely contributory to his hypoxia. -He was placed on azithromycin and steroid. -Continue with DuoNeb. -Repeat Covid test as CT chest with bilateral groundglass opacities and patient continue to become hypoxic.  A. fib with RVR.  Patient continued to remain in A. fib with rate control today. -Continue home dose of metoprolol and Xarelto.  Acute encephalopathy.  Seems improving today -CT  head obtained after fall last night was negative for any bleed. -Brain MRI did yesterday still pending.  Type 2 diabetes.  Uncontrolled with A1c of 8.1. CBG within normal range.  Having couple of episodes of hypoglycemia. -Decrease Lantus to 40 units at bedtime. -Continue with sensitive sliding scale.  CAD.  Denies any chest pain.  Troponin mildly elevated most likely secondary to demand. -Continue statin.  Chronic pain.  Patient has an history of chronic back pain and taking methadone and Lyrica at home for many years.  [facility] -Continue home dose of methadone. -Decrease Lyrica to 75 mg twice daily from 150 mg of home dose because of concern of fall and unsteadiness.  History of depression. Continue home dose of Seroquel and Zoloft.  Hypothyroidism. Continue home dose of Synthroid.  Objective: Vitals:   12/06/19 0500 12/06/19 0600 12/06/19 1200 12/06/19 1306  BP: 90/64 103/66    Pulse: 84 81    Resp: 19 17    Temp:   98.6 F (37 C)   TempSrc:   Oral   SpO2: 92% 97%  97%  Weight: 92.7 kg     Height:        Intake/Output Summary (Last 24 hours) at 12/06/2019 1521 Last data filed at 12/05/2019 2151 Gross per 24 hour  Intake 3 ml  Output --  Net 3 ml   Filed Weights   12/14/2019 1255 12/26/2019 1730 12/06/19 0500  Weight: 94.8 kg 93.9 kg 92.7 kg    Examination:  General exam: Chronically ill-appearing elderly man, in no acute distress. Respiratory system: Clear bilaterally, respiratory effort normal. Cardiovascular system: Irregularly irregular. Gastrointestinal system: Soft, nontender, nondistended,  bowel sounds positive. Central nervous system: Alert and oriented. No focal neurological deficits. Extremities: No edema, no cyanosis, pulses intact and symmetrical. Skin: No rashes, lesions or ulcers Psychiatry: Judgement and insight appear appropriate.  DVT prophylaxis: Xarelto Code Status: Full Family Communication: Daughter was updated at  bedside. Disposition Plan: Pending improvement.  Consultants:   Cardiology  Procedures:  Antimicrobials:  Azithromycin  Data Reviewed: I have personally reviewed following labs and imaging studies  CBC: Recent Labs  Lab 12/19/2019 1252 12/05/19 0313 12/06/19 0256  WBC 10.7* 11.6* 12.6*  NEUTROABS 8.9*  --   --   HGB 11.5* 10.4* 10.7*  HCT 36.2* 31.4* 33.0*  MCV 87.7 86.3 86.6  PLT 297 260 [id number]   Basic Metabolic Panel: Recent Labs  Lab 12/13/2019 1252 12/05/19 0313 12/06/19 0256  NA 131* 132* 133*  K 3.7 3.9 3.8  CL 96* 97* 95*  CO2 21* 24 27  GLUCOSE 179* 104* 165*  BUN 18 21 27*  CREATININE 1.24 1.29* 1.35*  CALCIUM 8.8* 8.3* 8.3*  MG  --  1.7  --    GFR: Estimated Creatinine Clearance: 53 mL/min (A) (by C-G formula based on SCr of 1.35 mg/dL (H)). Liver Function Tests: Recent Labs  Lab 12/08/2019 1252  AST 38  ALT 28  ALKPHOS 146*  BILITOT 0.9  PROT 7.8  ALBUMIN 3.6   No results for input(s): LIPASE, AMYLASE in the last 168 hours. No results for input(s): AMMONIA in the last 168 hours. Coagulation Profile: No results for input(s): INR, PROTIME in the last 168 hours. Cardiac Enzymes: No results for input(s): CKTOTAL, CKMB, CKMBINDEX, TROPONINI in the last 168 hours. BNP (last 3 results) No results for input(s): PROBNP in the last 8760 hours. HbA1C: Recent Labs    12/08/2019 2134  HGBA1C 8.1*   CBG: Recent Labs  Lab 12/05/19 1555 12/05/19 1837 12/05/19 2138 12/06/19 0821 12/06/19 1145  GLUCAP 82 85 133* 171* 420*   Lipid Profile: No results for input(s): CHOL, HDL, LDLCALC, TRIG, CHOLHDL, LDLDIRECT in the last 72 hours. Thyroid Function Tests: Recent Labs    12/05/19 0313  TSH 4.750*  T4TOTAL 5.5   Anemia Panel: No results for input(s): VITAMINB12, FOLATE, FERRITIN, TIBC, IRON, RETICCTPCT in the last 72 hours. Sepsis Labs: Recent Labs  Lab 12/25/2019 1252  PROCALCITON 0.21  LATICACIDVEN 1.2    Recent Results (from the past 240  hour(s))  Blood Culture (routine x 2)     Status: None (Preliminary result)   Collection Time: 12/26/2019 12:52 PM   Specimen: BLOOD  Result Value Ref Range Status   Specimen Description BLOOD BLOOD LEFT HAND  Final   Special Requests   Final    BOTTLES DRAWN AEROBIC AND ANAEROBIC Blood Culture results may not be optimal due to an excessive volume of blood received in culture bottles   Culture   Final    NO GROWTH 2 DAYS Performed at [facility], [address], [place], [place] [address]    Report Status PENDING  Incomplete  Blood Culture (routine x 2)     Status: None (Preliminary result)   Collection Time: 12/14/2019 12:57 PM   Specimen: BLOOD  Result Value Ref Range Status   Specimen Description BLOOD LEFT ANTECUBITAL  Final   Special Requests   Final    BOTTLES DRAWN AEROBIC AND ANAEROBIC Blood Culture adequate volume   Culture   Final    NO GROWTH 2 DAYS Performed at [facility], [place],  [place] [address]    Report Status PENDING  Incomplete  Respiratory Panel by RT PCR (Flu A&B, Covid) - Nasopharyngeal Swab     Status: None   Collection Time: 12/29/2019  3:45 PM   Specimen: Nasopharyngeal Swab  Result Value Ref Range Status   SARS Coronavirus 2 by RT PCR NEGATIVE NEGATIVE Final    Comment: (NOTE) SARS-CoV-2 target nucleic acids are NOT DETECTED. The SARS-CoV-2 RNA is generally detectable in upper respiratoy specimens during the acute phase of infection. The lowest concentration of SARS-CoV-2 viral copies this assay can detect is 131 copies/mL. A negative result does not preclude SARS-Cov-2 infection and should not be used as the sole basis for treatment or other patient management decisions. A negative result may occur with  improper specimen collection/handling, submission of specimen other than nasopharyngeal swab, presence of viral mutation(s) within the areas targeted by this assay, and inadequate number of viral copies (<131  copies/mL). A negative result must be combined with clinical observations, patient history, and epidemiological information. The expected result is Negative. Fact Sheet for Patients:  PinkCheek.be Fact Sheet for Healthcare Providers:  GravelBags.it This test is not yet ap proved or cleared by the [place] FDA and  has been authorized for detection and/or diagnosis of SARS-CoV-2 by FDA under an Emergency Use Authorization (EUA). This EUA will remain  in effect (meaning this test can be used) for the duration of the COVID-19 declaration under Section 564(b)(1) of the Act, 21 U.S.C. section 360bbb-3(b)(1), unless the authorization is terminated or revoked sooner.    Influenza A by PCR NEGATIVE NEGATIVE Final   Influenza B by PCR NEGATIVE NEGATIVE Final    Comment: (NOTE) The Xpert Xpress SARS-CoV-2/FLU/RSV assay is intended as an aid in  the diagnosis of influenza from Nasopharyngeal swab specimens and  should not be used as a sole basis for treatment. Nasal washings and  aspirates are unacceptable for Xpert Xpress SARS-CoV-2/FLU/RSV  testing. Fact Sheet for Patients: PinkCheek.be Fact Sheet for Healthcare Providers: GravelBags.it This test is not yet approved or cleared by the [place] FDA and  has been authorized for detection and/or diagnosis of SARS-CoV-2 by  FDA under an Emergency Use Authorization (EUA). This EUA will remain  in effect (meaning this test can be used) for the duration of the  Covid-19 declaration under Section 564(b)(1) of the Act, 21  U.S.C. section 360bbb-3(b)(1), unless the authorization is  terminated or revoked. Performed at [facility], [address] [place], [place] [address]   MRSA PCR Screening     Status: None   Collection Time: 12/05/19  9:52 PM   Specimen: Nasopharyngeal  Result Value Ref Range Status   MRSA by  PCR NEGATIVE NEGATIVE Final    Comment:        The GeneXpert MRSA Assay (FDA approved for NASAL specimens only), is one component of a comprehensive MRSA colonization surveillance program. It is not intended to diagnose MRSA infection nor to guide or monitor treatment for MRSA infections. Performed at [facility], [address] [place], [place] [address]      Radiology Studies: CT HEAD WO CONTRAST  Result Date: 12/02/2019 CLINICAL DATA:  Headaches EXAM: CT HEAD WITHOUT CONTRAST TECHNIQUE: Contiguous axial images were obtained from the base of the skull through the vertex without intravenous contrast. COMPARISON:  10/24/2013 FINDINGS: Brain: Mild atrophic changes and chronic white matter ischemic changes are seen. No findings to suggest acute hemorrhage, acute infarction or space-occupying mass lesion are noted. Vascular:  No hyperdense vessel or unexpected calcification. Skull: Normal. Negative for fracture or focal lesion. Sinuses/Orbits: No acute finding. Other: None. IMPRESSION: Chronic atrophic and ischemic changes without acute abnormality. Electronically Signed   By: Inez Catalina M.D.   On: 12/20/2019 20:46   CT ANGIO CHEST PE W OR WO CONTRAST  Result Date: 12/13/2019 CLINICAL DATA:  Hypoxemia EXAM: CT ANGIOGRAPHY CHEST WITH CONTRAST TECHNIQUE: Multidetector CT imaging of the chest was performed using the standard protocol during bolus administration of intravenous contrast. Multiplanar CT image reconstructions and MIPs were obtained to evaluate the vascular anatomy. CONTRAST:  31mL OMNIPAQUE IOHEXOL 350 MG/ML SOLN COMPARISON:  Chest x-ray from earlier in the same day. FINDINGS: Cardiovascular: Thoracic aorta demonstrates atherosclerotic calcifications without aneurysmal dilatation or dissection. Coronary calcifications are seen. No cardiac enlargement is seen. No pericardial effusion is noted. The pulmonary artery shows a normal branching pattern without intraluminal filling  defect to suggest pulmonary embolism. Mediastinum/Nodes: Thoracic inlet is within normal limits. Scattered mediastinal adenopathy is noted most prominent in the region of the right paratracheal stripe. A dominant node is seen measuring 2 cm in short axis. Scattered smaller mediastinal nodes are seen. Subcarinal lymph node measures almost 18 mm in short axis. Small hilar lymph nodes are seen. These changes are increased from a prior CT from 2010. The esophagus is within normal limits. Lungs/Pleura: Some fibrotic changes are noted in the lungs bilaterally although diffuse ground-glass opacities are seen with some interstitial thickening most consistent with atypical or viral pneumonia. Mild superimposed edema is likely present as well. No sizable effusion is noted. Upper Abdomen: Visualized upper abdomen is within normal limits. Musculoskeletal: Degenerative changes of the thoracic spine are seen. No acute rib abnormality is noted. Review of the MIP images confirms the above findings. IMPRESSION: No evidence of pulmonary emboli. Patchy ground-glass infiltrates with interstitial thickening likely related to patchy edema. Patient has a negative COVID-19 test. Atypical pneumonia may also be superimposed as well. Mediastinal and hilar adenopathy likely reactive given the appearance of the lungs. Aortic Atherosclerosis (ICD10-I70.0) and Emphysema (ICD10-J43.9). Electronically Signed   By: Inez Catalina M.D.   On: 12/24/2019 20:53   ECHOCARDIOGRAM COMPLETE  Result Date: 12/05/2019   ECHOCARDIOGRAM REPORT   Patient Name:   Michael Lucas Date of Exam: 12/05/2019 Medical Rec #:  [id number]      Height:       68.0 in Accession #:    [id number]     Weight:       207.1 lb Date of Birth:  [birth year]      BSA:          2.07 m Patient Age:    56 years       BP:           145/112 mmHg Patient Gender: M              HR:           128 bpm. Exam Location:  [facility] Procedure: 2D Echo, Cardiac Doppler and Color Doppler Indications:     CHF-  acute diastolic [id number]  History:         Patient has prior history of Echocardiogram examinations, most                  recent 05/04/2016. Signs/Symptoms:Murmur; Risk                  Factors:Hypertension. PAF.  Sonographer:     Sherrie Sport RDCS (AE) Referring Phys:  [id number]  SONA PATEL Diagnosing Phys: Ida Rogue MD  Sonographer Comments: No apical window and no subcostal window. Pt Could not wake up and communicate --- moving arms and legs during echo. IMPRESSIONS  1. Left ventricular ejection fraction, by visual estimation, is 60 to 65%. The left ventricle has normal function. There is no left ventricular hypertrophy.  2. The left ventricle has no regional wall motion abnormalities.  3. Global right ventricle has normal systolic function.The right ventricular size is normal. No increase in right ventricular wall thickness.  4. Left atrial size was moderately dilated.  5. TR signal is inadequate for assessing pulmonary artery systolic pressure. FINDINGS  Left Ventricle: Left ventricular ejection fraction, by visual estimation, is 60 to 65%. The left ventricle has normal function. The left ventricle has no regional wall motion abnormalities. There is no left ventricular hypertrophy. Normal left atrial pressure. Right Ventricle: The right ventricular size is normal. No increase in right ventricular wall thickness. Global RV systolic function is has normal systolic function. Left Atrium: Left atrial size was moderately dilated. Right Atrium: Right atrial size was normal in size Pericardium: There is no evidence of pericardial effusion. Mitral Valve: The mitral valve is normal in structure. Mild to moderate mitral valve regurgitation. No evidence of mitral valve stenosis by observation. Tricuspid Valve: The tricuspid valve is normal in structure. Tricuspid valve regurgitation is not demonstrated. Aortic Valve: The aortic valve was not well visualized. Aortic valve regurgitation is not visualized. Mild aortic valve  sclerosis is present, with no evidence of aortic valve stenosis. Pulmonic Valve: The pulmonic valve was normal in structure. Pulmonic valve regurgitation is not visualized. Pulmonic regurgitation is not visualized. Aorta: The aortic root, ascending aorta and aortic arch are all structurally normal, with no evidence of dilitation or obstruction. Venous: The inferior vena cava is normal in size with greater than 50% respiratory variability, suggesting right atrial pressure of 3 mmHg. IAS/Shunts: No atrial level shunt detected by color flow Doppler. There is no evidence of a patent foramen ovale. No ventricular septal defect is seen or detected. There is no evidence of an atrial septal defect.  LEFT VENTRICLE PLAX 2D LVIDd:         4.23 cm LVIDs:         2.81 cm LV PW:         1.22 cm LV IVS:        1.16 cm LVOT diam:     2.10 cm LV SV:         50 ml LV SV Index:   23.28 LVOT Area:     3.46 cm  LEFT ATRIUM         Index LA diam:    5.00 cm 2.41 cm/m                        PULMONIC VALVE AORTA                 PV Vmax:        0.61 m/s Ao Root diam: 3.30 cm PV Peak grad:   1.5 mmHg                       RVOT Peak grad: 4 mmHg   SHUNTS Systemic Diam: 2.10 cm  Ida Rogue MD Electronically signed by Ida Rogue MD Signature Date/Time: 12/05/2019/5:39:27 PM    Final     Scheduled Meds: . atorvastatin  80 mg Oral QPM  .  Chlorhexidine Gluconate Cloth  6 each Topical Daily  . cholecalciferol  1,000 Units Oral Daily  . [START ON 12/07/2019] furosemide  40 mg Intravenous Daily  . influenza vaccine adjuvanted  0.5 mL Intramuscular Tomorrow-1000  . insulin aspart  0-9 Units Subcutaneous TID WC  . insulin glargine  40 Units Subcutaneous QHS  . levothyroxine  50 mcg Oral QAC breakfast  . lidocaine  1 patch Transdermal Q24H  . LORazepam  2 mg Intravenous Once  . methadone  10 mg Oral BID  . metoprolol tartrate  100 mg Oral BID  . prazosin  6 mg Oral QHS  . predniSONE  40 mg Oral Q breakfast  . pregabalin  75 mg  Oral BID  . QUEtiapine  50 mg Oral QHS  . rivaroxaban  20 mg Oral Q supper  . sertraline  100 mg Oral Daily  . sodium chloride flush  3 mL Intravenous Q12H   Continuous Infusions: . sodium chloride    . azithromycin 500 mg (12/06/19 0939)     LOS: 2 days   Time spent: 35 minutes  Lorella Nimrod, MD [facility] Pager [phone]  If 7PM-7AM, please contact night-coverage [email or web address] Password [facility] 12/06/2019, 3:21 PM   This record has been created using Dragon voice recognition software. Errors have been sought and corrected,but may not always be located. Such creation errors do not reflect on the standard of care.

## 2019-12-06 NOTE — Progress Notes (Signed)
Progress Note  Patient Name: Michael Lucas Date of Encounter: 12/06/2019  Primary Cardiologist: Kathlyn Sacramento, MD  Subjective   Feels much better today Might try to eat some eggs, reports that he typically eats Glucerna in the morning Continues to have marked desaturations when taken off oxygen with slow recovery  At Lasix IV twice daily yesterday 1 L negative  Inpatient Medications    Scheduled Meds:  atorvastatin  80 mg Oral QPM   Chlorhexidine Gluconate Cloth  6 each Topical Daily   cholecalciferol  1,000 Units Oral Daily   furosemide  40 mg Intravenous BID   influenza vaccine adjuvanted  0.5 mL Intramuscular Tomorrow-1000   insulin aspart  0-9 Units Subcutaneous TID WC   insulin glargine  40 Units Subcutaneous QHS   levothyroxine  50 mcg Oral QAC breakfast   lidocaine  1 patch Transdermal Q24H   LORazepam  2 mg Intravenous Once   methadone  10 mg Oral BID   metoprolol tartrate  100 mg Oral BID   prazosin  6 mg Oral QHS   predniSONE  40 mg Oral Q breakfast   pregabalin  75 mg Oral BID   QUEtiapine  50 mg Oral QHS   rivaroxaban  20 mg Oral Q supper   sertraline  100 mg Oral Daily   sodium chloride flush  3 mL Intravenous Q12H   Continuous Infusions:  sodium chloride     azithromycin 500 mg (12/06/19 0939)   PRN Meds: sodium chloride, acetaminophen, ipratropium-albuterol, ondansetron (ZOFRAN) IV, sodium chloride flush   Vital Signs    Vitals:   12/06/19 0400 12/06/19 0500 12/06/19 0600 12/06/19 1200  BP: 126/76 90/64 103/66   Pulse: 87 84 81   Resp: 13 19 17    Temp:    98.6 F (37 C)  TempSrc:    Oral  SpO2: 91% 92% 97%   Weight:  92.7 kg    Height:        Intake/Output Summary (Last 24 hours) at 12/06/2019 1246 Last data filed at 12/05/2019 2151 Gross per 24 hour  Intake 3 ml  Output --  Net 3 ml   Filed Weights   12/27/2019 1255 12/03/2019 1730 12/06/19 0500  Weight: 94.8 kg 93.9 kg 92.7 kg    Physical Exam    Constitutional: Alert, somewhat communicative HENT:  Head: Grossly normal Eyes:  no discharge. No scleral icterus.  Neck: No JVD, no carotid bruits  Cardiovascular: Regular rate and rhythm, no murmurs appreciated Pulmonary/Chest: Coarse breath sounds Abdominal: Soft.  no distension.  no tenderness.  Musculoskeletal: Normal range of motion Neurological:  normal muscle tone. Coordination normal. No atrophy Skin: Skin warm and dry Psychiatric: Pleasant, alert   Labs    Chemistry Recent Labs  Lab 12/21/2019 1252 12/05/19 0313 12/06/19 0256  NA 131* 132* 133*  K 3.7 3.9 3.8  CL 96* 97* 95*  CO2 21* 24 27  GLUCOSE 179* 104* 165*  BUN 18 21 27*  CREATININE 1.24 1.29* 1.35*  CALCIUM 8.8* 8.3* 8.3*  PROT 7.8  --   --   ALBUMIN 3.6  --   --   AST 38  --   --   ALT 28  --   --   ALKPHOS 146*  --   --   BILITOT 0.9  --   --   GFRNONAA 57* 54* 51*  GFRAA >60 >60 60*  ANIONGAP 14 11 11      Hematology Recent Labs  Lab 12/01/2019 1252 12/05/19  [id number] 12/06/19 0256  WBC 10.7* 11.6* 12.6*  RBC 4.13* 3.64* 3.81*  HGB 11.5* 10.4* 10.7*  HCT 36.2* 31.4* 33.0*  MCV 87.7 86.3 86.6  MCH 27.8 28.6 28.1  MCHC 31.8 33.1 32.4  RDW 13.3 13.2 13.2  PLT 297 260 270    Cardiac Enzymes  Recent Labs  Lab 12/05/19 0313 12/05/19 0619 12/05/19 0810  TROPONINIHS 87* 89* 95*      BNP Recent Labs  Lab 12/12/2019 1252 12/05/19 0313  BNP 276.0* 421.0*      Radiology    CT HEAD WO CONTRAST  Result Date: 12/22/2019 CLINICAL DATA:  Headaches EXAM: CT HEAD WITHOUT CONTRAST TECHNIQUE: Contiguous axial images were obtained from the base of the skull through the vertex without intravenous contrast. COMPARISON:  10/24/2013 FINDINGS: Brain: Mild atrophic changes and chronic white matter ischemic changes are seen. No findings to suggest acute hemorrhage, acute infarction or space-occupying mass lesion are noted. Vascular: No hyperdense vessel or unexpected calcification. Skull: Normal. Negative  for fracture or focal lesion. Sinuses/Orbits: No acute finding. Other: None. IMPRESSION: Chronic atrophic and ischemic changes without acute abnormality. Electronically Signed   By: Inez Catalina M.D.   On: 12/16/2019 20:46   CT ANGIO CHEST PE W OR WO CONTRAST  Result Date: 12/17/2019 CLINICAL DATA:  Hypoxemia EXAM: CT ANGIOGRAPHY CHEST WITH CONTRAST TECHNIQUE: Multidetector CT imaging of the chest was performed using the standard protocol during bolus administration of intravenous contrast. Multiplanar CT image reconstructions and MIPs were obtained to evaluate the vascular anatomy. CONTRAST:  44mL OMNIPAQUE IOHEXOL 350 MG/ML SOLN COMPARISON:  Chest x-ray from earlier in the same day. FINDINGS: Cardiovascular: Thoracic aorta demonstrates atherosclerotic calcifications without aneurysmal dilatation or dissection. Coronary calcifications are seen. No cardiac enlargement is seen. No pericardial effusion is noted. The pulmonary artery shows a normal branching pattern without intraluminal filling defect to suggest pulmonary embolism. Mediastinum/Nodes: Thoracic inlet is within normal limits. Scattered mediastinal adenopathy is noted most prominent in the region of the right paratracheal stripe. A dominant node is seen measuring 2 cm in short axis. Scattered smaller mediastinal nodes are seen. Subcarinal lymph node measures almost 18 mm in short axis. Small hilar lymph nodes are seen. These changes are increased from a prior CT from 2010. The esophagus is within normal limits. Lungs/Pleura: Some fibrotic changes are noted in the lungs bilaterally although diffuse ground-glass opacities are seen with some interstitial thickening most consistent with atypical or viral pneumonia. Mild superimposed edema is likely present as well. No sizable effusion is noted. Upper Abdomen: Visualized upper abdomen is within normal limits. Musculoskeletal: Degenerative changes of the thoracic spine are seen. No acute rib abnormality is  noted. Review of the MIP images confirms the above findings. IMPRESSION: No evidence of pulmonary emboli. Patchy ground-glass infiltrates with interstitial thickening likely related to patchy edema. Patient has a negative COVID-19 test. Atypical pneumonia may also be superimposed as well. Mediastinal and hilar adenopathy likely reactive given the appearance of the lungs. Aortic Atherosclerosis (ICD10-I70.0) and Emphysema (ICD10-J43.9). Electronically Signed   By: Inez Catalina M.D.   On: 12/11/2019 20:53   DG Chest Port 1 View  Result Date: 12/06/2019 CLINICAL DATA:  Shortness of breath. EXAM: PORTABLE CHEST 1 VIEW COMPARISON:  Chest x-ray 10/24/2013. FINDINGS: Prior median sternotomy. Borderline cardiomegaly. Diffuse severe bilateral pulmonary infiltrates. No pleural effusion or pneumothorax. No acute bony abnormality. IMPRESSION: 1. Diffuse severe bilateral pulmonary infiltrates/edema. Bilateral multifocal pneumonia and or pulmonary edema could present this fashion. 2.  Prior median sternotomy.  Borderline cardiomegaly. Electronically Signed   By: Marcello Moores  Register   On: 12/21/2019 13:21   ECHOCARDIOGRAM COMPLETE  Result Date: 12/05/2019   ECHOCARDIOGRAM REPORT   Patient Name:   TYQUEZ TOUTANT Date of Exam: 12/05/2019 Medical Rec #:  [id number]      Height:       68.0 in Accession #:    [id number]     Weight:       207.1 lb Date of Birth:  [birth year]      BSA:          2.07 m Patient Age:    74 years       BP:           145/112 mmHg Patient Gender: M              HR:           128 bpm. Exam Location:  [facility] Procedure: 2D Echo, Cardiac Doppler and Color Doppler Indications:     CHF- acute diastolic [id number]  History:         Patient has prior history of Echocardiogram examinations, most                  recent 05/04/2016. Signs/Symptoms:Murmur; Risk                  Factors:Hypertension. PAF.  Sonographer:     Sherrie Sport RDCS (AE) Referring Phys:  [place] Diagnosing Phys: Ida Rogue MD  Sonographer  Comments: No apical window and no subcostal window. Pt Could not wake up and communicate --- moving arms and legs during echo. IMPRESSIONS  1. Left ventricular ejection fraction, by visual estimation, is 60 to 65%. The left ventricle has normal function. There is no left ventricular hypertrophy.  2. The left ventricle has no regional wall motion abnormalities.  3. Global right ventricle has normal systolic function.The right ventricular size is normal. No increase in right ventricular wall thickness.  4. Left atrial size was moderately dilated.  5. TR signal is inadequate for assessing pulmonary artery systolic pressure. FINDINGS  Left Ventricle: Left ventricular ejection fraction, by visual estimation, is 60 to 65%. The left ventricle has normal function. The left ventricle has no regional wall motion abnormalities. There is no left ventricular hypertrophy. Normal left atrial pressure. Right Ventricle: The right ventricular size is normal. No increase in right ventricular wall thickness. Global RV systolic function is has normal systolic function. Left Atrium: Left atrial size was moderately dilated. Right Atrium: Right atrial size was normal in size Pericardium: There is no evidence of pericardial effusion. Mitral Valve: The mitral valve is normal in structure. Mild to moderate mitral valve regurgitation. No evidence of mitral valve stenosis by observation. Tricuspid Valve: The tricuspid valve is normal in structure. Tricuspid valve regurgitation is not demonstrated. Aortic Valve: The aortic valve was not well visualized. Aortic valve regurgitation is not visualized. Mild aortic valve sclerosis is present, with no evidence of aortic valve stenosis. Pulmonic Valve: The pulmonic valve was normal in structure. Pulmonic valve regurgitation is not visualized. Pulmonic regurgitation is not visualized. Aorta: The aortic root, ascending aorta and aortic arch are all structurally normal, with no evidence of dilitation or  obstruction. Venous: The inferior vena cava is normal in size with greater than 50% respiratory variability, suggesting right atrial pressure of 3 mmHg. IAS/Shunts: No atrial level shunt detected by color flow Doppler. There is no evidence of a patent foramen ovale. No ventricular septal defect is seen  or detected. There is no evidence of an atrial septal defect.  LEFT VENTRICLE PLAX 2D LVIDd:         4.23 cm LVIDs:         2.81 cm LV PW:         1.22 cm LV IVS:        1.16 cm LVOT diam:     2.10 cm LV SV:         50 ml LV SV Index:   23.28 LVOT Area:     3.46 cm  LEFT ATRIUM         Index LA diam:    5.00 cm 2.41 cm/m                        PULMONIC VALVE AORTA                 PV Vmax:        0.61 m/s Ao Root diam: 3.30 cm PV Peak grad:   1.5 mmHg                       RVOT Peak grad: 4 mmHg   SHUNTS Systemic Diam: 2.10 cm  Ida Rogue MD Electronically signed by Ida Rogue MD Signature Date/Time: 12/05/2019/5:39:27 PM    Final     Telemetry    AFib, 90's - Personally Reviewed  Cardiac Studies   Echo pending this AM.  Patient Profile   74 y.o. male with a hx of CAD status post one-vessel CABG in 1995 with LIMA to LAD, atrial flutter status post ablation in 2010, chronic A. fib previously on Coumadin though discontinued in 2011 secondary to frequent falls now on Xarelto and tolerating, HFpEF, pulmonary hypertension, CKD stage III, HTN, HLD, gait instability with peripheral neuropathy, mild nonobstructive bilateral carotid artery disease, and OSA intolerant to CPAP who was admitted 1/5 due to resp failure and acute on chronic HFpEF.  Assessment & Plan    1.  Acute hypoxic resp failure/Lingular PNA:   -Despite diuresis continues to have incredible rapid desaturations off oxygen/witnessed down into the 70s again today off nasal cannula oxygen with slow recovery --Echocardiogram clinical exam and lab work do not suggest significant fluid overload ---CT scan with patchy groundglass  infiltrates (Consider repeat Covid test?) --Recommend continued steroids, antibiotics -If no improvement may need to consult pulmonology  2.  chronic HFpEF:   No evidence of significant fluid overload on exam Now with worsening creatinine and BUN concerning for prerenal state --Would suggest we backed down on his diuretic down to Lasix IV 40 daily, from twice daily with close monitoring of renal function  3.  Permanent Afib:    Cont  blocker.  Anticoagulation w/ xarelto (CHA2DS2VASc = 4). Heart rate improved  4.  CKD III:   Worsening function, will back down on the Lasix 40 IV daily May need to hold Lasix tomorrow for worsening renal function  5.  Fall:   Fell to nights ago hit his head. CT head neg for bleed.    6.  CAD/elevated troponin:   S/p prior CABG.   Low flat trending troponin No further ischemic work-up needed given normal ejection fraction with no wall motion abnormality  7.  HL:   LDL 43 in 09/2018.  Cont statin.  Long discussion with patient and nursing concerning hypoxia, causes  Total encounter time more than 35 minutes  Greater than 50% was spent in  counseling and coordination of care with the patient   Signed, Ida Rogue, MD  12/06/2019, 12:46 PM    For questions or updates, please contact   Please consult [email or web address] for contact info under Cardiology/STEMI.

## 2019-12-06 NOTE — Progress Notes (Signed)
pts cbg 420. Notified md. Awaiting orders. Cont to monitor.

## 2019-12-07 ENCOUNTER — Inpatient Hospital Stay: Payer: Medicare Other

## 2019-12-07 DIAGNOSIS — J9621 Acute and chronic respiratory failure with hypoxia: Secondary | ICD-10-CM

## 2019-12-07 DIAGNOSIS — Z515 Encounter for palliative care: Secondary | ICD-10-CM

## 2019-12-07 DIAGNOSIS — I63413 Cerebral infarction due to embolism of bilateral middle cerebral arteries: Secondary | ICD-10-CM

## 2019-12-07 DIAGNOSIS — I4819 Other persistent atrial fibrillation: Secondary | ICD-10-CM

## 2019-12-07 LAB — CBC WITH DIFFERENTIAL/PLATELET
Abs Immature Granulocytes: 0.09 10*3/uL — ABNORMAL HIGH (ref 0.00–0.07)
Basophils Absolute: 0 10*3/uL (ref 0.0–0.1)
Basophils Relative: 0 %
Eosinophils Absolute: 0.1 10*3/uL (ref 0.0–0.5)
Eosinophils Relative: 1 %
HCT: 33.9 % — ABNORMAL LOW (ref 39.0–52.0)
Hemoglobin: 11 g/dL — ABNORMAL LOW (ref 13.0–17.0)
Immature Granulocytes: 1 %
Lymphocytes Relative: 8 %
Lymphs Abs: 1.2 10*3/uL (ref 0.7–4.0)
MCH: 27.8 pg (ref 26.0–34.0)
MCHC: 32.4 g/dL (ref 30.0–36.0)
MCV: 85.8 fL (ref 80.0–100.0)
Monocytes Absolute: 1 10*3/uL (ref 0.1–1.0)
Monocytes Relative: 7 %
Neutro Abs: 12.8 10*3/uL — ABNORMAL HIGH (ref 1.7–7.7)
Neutrophils Relative %: 83 %
Platelets: 282 10*3/uL (ref 150–400)
RBC: 3.95 MIL/uL — ABNORMAL LOW (ref 4.22–5.81)
RDW: 13.1 % (ref 11.5–15.5)
WBC: 15.2 10*3/uL — ABNORMAL HIGH (ref 4.0–10.5)
nRBC: 0 % (ref 0.0–0.2)

## 2019-12-07 LAB — COMPREHENSIVE METABOLIC PANEL
ALT: 24 U/L (ref 0–44)
AST: 32 U/L (ref 15–41)
Albumin: 3 g/dL — ABNORMAL LOW (ref 3.5–5.0)
Alkaline Phosphatase: 172 U/L — ABNORMAL HIGH (ref 38–126)
Anion gap: 12 (ref 5–15)
BUN: 37 mg/dL — ABNORMAL HIGH (ref 8–23)
CO2: 26 mmol/L (ref 22–32)
Calcium: 8.4 mg/dL — ABNORMAL LOW (ref 8.9–10.3)
Chloride: 96 mmol/L — ABNORMAL LOW (ref 98–111)
Creatinine, Ser: 1.44 mg/dL — ABNORMAL HIGH (ref 0.61–1.24)
GFR calc Af Amer: 55 mL/min — ABNORMAL LOW (ref >60)
GFR calc non Af Amer: 47 mL/min — ABNORMAL LOW (ref >60)
Glucose, Bld: 65 mg/dL — ABNORMAL LOW (ref 70–99)
Potassium: 3.5 mmol/L (ref 3.5–5.1)
Sodium: 134 mmol/L — ABNORMAL LOW (ref 135–145)
Total Bilirubin: 0.5 mg/dL (ref 0.3–1.2)
Total Protein: 6.8 g/dL (ref 6.5–8.1)

## 2019-12-07 LAB — RESPIRATORY PANEL BY RT PCR (FLU A&B, COVID)
Influenza A by PCR: NEGATIVE
Influenza B by PCR: NEGATIVE
SARS Coronavirus 2 by RT PCR: NEGATIVE

## 2019-12-07 LAB — GLUCOSE, CAPILLARY
Glucose-Capillary: 80 mg/dL (ref 70–99)
Glucose-Capillary: 153 mg/dL — ABNORMAL HIGH (ref 70–99)
Glucose-Capillary: 285 mg/dL — ABNORMAL HIGH (ref 70–99)
Glucose-Capillary: 267 mg/dL — ABNORMAL HIGH (ref 70–99)

## 2019-12-07 LAB — MAGNESIUM: Magnesium: 2.2 mg/dL (ref 1.7–2.4)

## 2019-12-07 LAB — SARS CORONAVIRUS 2 (TAT 6-24 HRS): SARS Coronavirus 2: NEGATIVE

## 2019-12-07 LAB — FERRITIN: Ferritin: 1000 ng/mL — ABNORMAL HIGH (ref 24–336)

## 2019-12-07 LAB — BRAIN NATRIURETIC PEPTIDE: B Natriuretic Peptide: 327 pg/mL — ABNORMAL HIGH (ref 0.0–100.0)

## 2019-12-07 LAB — FIBRIN DERIVATIVES D-DIMER (ARMC ONLY): Fibrin derivatives D-dimer (ARMC): 5589.48 ng/mL (FEU) — ABNORMAL HIGH (ref 0.00–499.00)

## 2019-12-07 LAB — C-REACTIVE PROTEIN: CRP: 18.4 mg/dL — ABNORMAL HIGH (ref <1.0)

## 2019-12-07 LAB — PROCALCITONIN: Procalcitonin: 1.07 ng/mL

## 2019-12-07 MED ORDER — SENNA 8.6 MG PO TABS
2.0000 | ORAL_TABLET | Freq: Every day | ORAL | Status: DC
  Administered 2019-12-07 – 2019-12-08 (×2): 17.2 mg via ORAL
  Filled 2019-12-07 (×2): qty 2

## 2019-12-07 MED ORDER — VANCOMYCIN HCL IN DEXTROSE 1-5 GM/200ML-% IV SOLN: 1000.0000 mg | INTRAVENOUS | Status: DC

## 2019-12-07 MED ORDER — POTASSIUM CHLORIDE 20 MEQ PO PACK
40.0000 meq | PACK | Freq: Once | ORAL | Status: AC
  Administered 2019-12-07: 40 meq via ORAL
  Filled 2019-12-07: qty 2

## 2019-12-07 MED ORDER — PHENOL 1.4 % MT LIQD
1.0000 | OROMUCOSAL | Status: DC | PRN
  Administered 2019-12-07: 1 via OROMUCOSAL
  Filled 2019-12-07: qty 177

## 2019-12-07 MED ORDER — SODIUM CHLORIDE 0.9% FLUSH: 10.0000 mL | INTRAVENOUS | Status: DC | PRN

## 2019-12-07 MED ORDER — SODIUM CHLORIDE 0.9 % IV SOLN
100.0000 mg | Freq: Two times a day (BID) | INTRAVENOUS | Status: DC
  Administered 2019-12-07 – 2019-12-09 (×5): 100 mg via INTRAVENOUS
  Filled 2019-12-07 (×7): qty 100

## 2019-12-07 MED ORDER — VANCOMYCIN HCL 2000 MG/400ML IV SOLN
2000.0000 mg | Freq: Once | INTRAVENOUS | Status: AC
  Administered 2019-12-07: 2000 mg via INTRAVENOUS
  Filled 2019-12-07: qty 400

## 2019-12-07 MED ORDER — IPRATROPIUM-ALBUTEROL 0.5-2.5 (3) MG/3ML IN SOLN
3.0000 mL | RESPIRATORY_TRACT | Status: DC
  Administered 2019-12-07 – 2019-12-09 (×12): 3 mL via RESPIRATORY_TRACT
  Filled 2019-12-07 (×11): qty 3

## 2019-12-07 MED ORDER — ASPIRIN EC 81 MG PO TBEC
81.0000 mg | DELAYED_RELEASE_TABLET | Freq: Every day | ORAL | Status: DC
  Administered 2019-12-07 – 2019-12-08 (×2): 81 mg via ORAL
  Filled 2019-12-07 (×2): qty 1

## 2019-12-07 MED ORDER — ATORVASTATIN CALCIUM 20 MG PO TABS
80.0000 mg | ORAL_TABLET | Freq: Every day | ORAL | Status: DC
  Administered 2019-12-07 – 2019-12-08 (×2): 80 mg via ORAL
  Filled 2019-12-07: qty 4

## 2019-12-07 MED ORDER — SODIUM CHLORIDE 0.9 % IV SOLN
2.0000 g | Freq: Three times a day (TID) | INTRAVENOUS | Status: DC
  Administered 2019-12-07: 2 g via INTRAVENOUS
  Filled 2019-12-07 (×3): qty 2

## 2019-12-07 MED ORDER — METHYLPREDNISOLONE SODIUM SUCC 40 MG IJ SOLR
40.0000 mg | Freq: Two times a day (BID) | INTRAMUSCULAR | Status: DC
  Administered 2019-12-07 – 2019-12-09 (×4): 40 mg via INTRAVENOUS
  Filled 2019-12-07 (×4): qty 1

## 2019-12-07 MED ORDER — SODIUM CHLORIDE 0.9 % IV SOLN
1.0000 g | INTRAVENOUS | Status: DC
  Administered 2019-12-07: 1 g via INTRAVENOUS
  Filled 2019-12-07: qty 10

## 2019-12-07 MED ORDER — BUDESONIDE 0.5 MG/2ML IN SUSP
0.5000 mg | Freq: Two times a day (BID) | RESPIRATORY_TRACT | Status: DC
  Administered 2019-12-07 – 2019-12-09 (×5): 0.5 mg via RESPIRATORY_TRACT
  Filled 2019-12-07 (×5): qty 2

## 2019-12-07 MED ORDER — SODIUM CHLORIDE 0.9 % IV SOLN
2.0000 g | Freq: Two times a day (BID) | INTRAVENOUS | Status: DC
  Administered 2019-12-08 – 2019-12-09 (×4): 2 g via INTRAVENOUS
  Filled 2019-12-07 (×6): qty 2

## 2019-12-07 NOTE — Consult Note (Signed)
Reason for Consult: stoke and AMS  Referring Physician: Dr. Reesa Chew   CC: embolic stroke  HPI: Michael Lucas is an 74 y.o. male a known history of CAD status post CABG, history of atrial flutter/relation status post ablation 2010, hypertension, gait instability with peripheral neuropathy, depression and chronic CHF diastolic comes to the emergency room with increasing shortness of breath for last couple days. Patient does not use oxygen at baseline. Patient was found to be confused and found to be in A. fib with RVR and acute on chronic heart failure. Patient is being treated for hypoxia/PNA.  MRI embolic strokes.    Past Medical History:  Diagnosis Date  . Anemia   . Ankle fracture, left   . Anxiety   . Arthritis   . Atrial flutter ([place])    a. 12/2008 s/p RFCA. Holter monitor 9/20 showed predominantly NSR with some short runs of atrial fibrillation. Coumadin stopped 7/11 due to frequent falls.  . Carotid arterial disease ([place])    a. 11/2011 U/S: 0-39% bilat ICA stenosis;  b. 03/2013 Carotid U/S: 0-39% bilat ICA stenosis.  . Cervical disc disease   . Chronic back pain   . Chronic diastolic CHF (congestive heart failure) ([place])    a. 07/2009 Echo: EF 50-55%, mild LVH, grade I DD, mild MR; b. 04/2015 Echo: Ef 65-70%, triv AI.  Marland Kitchen Chronic neck pain   . CKD (chronic kidney disease), stage III    stage III  . Coronary artery disease    a. 1995 s/p CABG x 1 (LIMA->LAD);  b. 04/2001 Cath: LAD 100, patent LIMA->LAD, otw nonobs LCX/RCA dzs.  . Depression   . Dermatitis   . Diabetes mellitus   . Dyspnea   . Edema    FEET/LEGS  . Fractured fibula   . Heart murmur   . Hypertensive heart disease   . Hypothyroidism   . Nephrolithiasis    h/o  . Orthopnea    2-3 PILLOWS  . OSA (obstructive sleep apnea)    a. Unable to tolerate CPAP because of allergic reaction to straps of face mask.  . Paroxysmal atrial fibrillation ([place])    a. 07/2009 PAF noted on holter; b. 05/2010 Coumadin d/c'd 2/2 falls; c.  02/2015 CHA2DS2VASc = 5-->Xarelto.  . Peripheral neuropathy    suspect diabetes related. has led to gait instability. seen by [facility] neurology, head MRI showed only mild diffuse atrophy with moderate peri-sylvan atrophy.  Marland Kitchen PTSD (post-traumatic stress disorder)    takes prazosin for this.  . Pulmonary embolism ([place]) 2010  . Stroke [facility])    POSSIBLE TIA  . Wears dentures    full upper and lower (only wears upper)    Past Surgical History:  Procedure Laterality Date  . ABLATION     CARDIAC  . CATARACT EXTRACTION W/PHACO Right 11/04/2016   Procedure: CATARACT EXTRACTION PHACO AND INTRAOCULAR LENS PLACEMENT (IOC);  Surgeon: Eulogio Bear, MD;  Location: [facility] ORS;  Service: Ophthalmology;  Laterality: Right;  [place] 53.7 AP% 12.6 CDE 6.75 Fluid pack lot # [id number]  . CATARACT EXTRACTION W/PHACO Left 12/02/2016   Procedure: CATARACT EXTRACTION PHACO AND INTRAOCULAR LENS PLACEMENT (IOC);  Surgeon: Eulogio Bear, MD;  Location: [facility] ORS;  Service: Ophthalmology;  Laterality: Left;  [place] 43.5 AP% 12.5 CDE 5.48 Fluid Pack lot # [id number]  . COLONOSCOPY WITH PROPOFOL N/A 08/26/2017   Procedure: COLONOSCOPY WITH PROPOFOL;  Surgeon: Lucilla Lame, MD;  Location: [place];  Service: Gastroenterology;  Laterality: N/A;  Diabetic -  insulin  . CORONARY ARTERY BYPASS GRAFT  1995  . HEMORRHOID SURGERY    . HERNIA REPAIR    . POLYPECTOMY  08/26/2017   Procedure: POLYPECTOMY;  Surgeon: Lucilla Lame, MD;  Location: [place];  Service: Gastroenterology;;  . [place]    . ROTATOR CUFF REPAIR      Family History  Problem Relation Age of Onset  . Cancer Mother        Died from lung cancer  . Heart disease Father   . Cancer Sister        Rectal cancer  . Cancer Sister        colon cancer  . Alcohol abuse Sister        died from alcohol poisoning    Social History:  reports that he quit smoking about 34 years ago. His smoking use included cigarettes. He has never  used smokeless tobacco. He reports that he does not drink alcohol or use drugs.  No Known Allergies  Medications: I have reviewed the patient's current medications.  ROS: Unable to obtain as periods of SOB and confusion   Physical Examination: Blood pressure 109/79, pulse 87, temperature (!) 96.7 F (35.9 C), temperature source Axillary, resp. rate (!) 21, height 5\' 8"  (1.727 m), weight 92 kg, SpO2 93 %.  Neurological Examination   Mental Status: Alert, oriented, thought content appropriate.   Able to tell me name and location  Cranial Nerves: II: Discs flat bilaterally; Visual fields grossly normal, pupils equal, round, reactive to light and accommodation III,IV, VI: ptosis not present, extra-ocular motions intact bilaterally V,VII: smile symmetric, facial light touch sensation normal bilaterally VIII: hearing normal bilaterally XI: bilateral shoulder shrug XII: midline tongue extension Motor: Generalized weakness 4+/5 bilaterally  Tone and bulk:normal tone throughout; no atrophy noted Sensory: Pinprick and light touch intact throughout, bilaterally Deep Tendon Reflexes: 1+ and symmetric throughout Plantars: Right: downgoing   Left: downgoing Cerebellar: normal finger-to-nose Gait: not tested    Laboratory Studies:   Basic Metabolic Panel: Recent Labs  Lab 12/24/2019 1252 12/05/19 0313 12/06/19 0256 12/07/19 0408  NA 131* 132* 133* 134*  K 3.7 3.9 3.8 3.5  CL 96* 97* 95* 96*  CO2 21* 24 27 26   GLUCOSE 179* 104* 165* 65*  BUN 18 21 27* 37*  CREATININE 1.24 1.29* 1.35* 1.44*  CALCIUM 8.8* 8.3* 8.3* 8.4*  MG  --  1.7  --   --     Liver Function Tests: Recent Labs  Lab 12/17/2019 1252 12/07/19 0408  AST 38 32  ALT 28 24  ALKPHOS 146* 172*  BILITOT 0.9 0.5  PROT 7.8 6.8  ALBUMIN 3.6 3.0*   No results for input(s): LIPASE, AMYLASE in the last 168 hours. No results for input(s): AMMONIA in the last 168 hours.  CBC: Recent Labs  Lab 12/21/2019 1252  12/05/19 0313 12/06/19 0256 12/07/19 0408  WBC 10.7* 11.6* 12.6* 15.2*  NEUTROABS 8.9*  --   --  12.8*  HGB 11.5* 10.4* 10.7* 11.0*  HCT 36.2* 31.4* 33.0* 33.9*  MCV 87.7 86.3 86.6 85.8  PLT 297 260 270 282    Cardiac Enzymes: No results for input(s): CKTOTAL, CKMB, CKMBINDEX, TROPONINI in the last 168 hours.  BNP: Invalid input(s): POCBNP  CBG: Recent Labs  Lab 12/06/19 0821 12/06/19 1145 12/06/19 1626 12/06/19 1747 12/06/19 2140  GLUCAP 171* 420* 410* 398* 253*    Microbiology: Results for orders placed or performed during the hospital encounter of 12/10/2019  Blood  Culture (routine x 2)     Status: None (Preliminary result)   Collection Time: 12/20/2019 12:52 PM   Specimen: BLOOD  Result Value Ref Range Status   Specimen Description BLOOD BLOOD LEFT HAND  Final   Special Requests   Final    BOTTLES DRAWN AEROBIC AND ANAEROBIC Blood Culture results may not be optimal due to an excessive volume of blood received in culture bottles   Culture   Final    NO GROWTH 3 DAYS Performed at [facility], [address], [place], [place] [address]    Report Status PENDING  Incomplete  Blood Culture (routine x 2)     Status: None (Preliminary result)   Collection Time: 12/18/2019 12:57 PM   Specimen: BLOOD  Result Value Ref Range Status   Specimen Description BLOOD LEFT ANTECUBITAL  Final   Special Requests   Final    BOTTLES DRAWN AEROBIC AND ANAEROBIC Blood Culture adequate volume   Culture   Final    NO GROWTH 3 DAYS Performed at [facility], [address], [place], [place] [address]    Report Status PENDING  Incomplete  Respiratory Panel by RT PCR (Flu A&B, Covid) - Nasopharyngeal Swab     Status: None   Collection Time: 12/24/2019  3:45 PM   Specimen: Nasopharyngeal Swab  Result Value Ref Range Status   SARS Coronavirus 2 by RT PCR NEGATIVE NEGATIVE Final    Comment: (NOTE) SARS-CoV-2 target nucleic acids are NOT DETECTED. The SARS-CoV-2 RNA is  generally detectable in upper respiratoy specimens during the acute phase of infection. The lowest concentration of SARS-CoV-2 viral copies this assay can detect is 131 copies/mL. A negative result does not preclude SARS-Cov-2 infection and should not be used as the sole basis for treatment or other patient management decisions. A negative result may occur with  improper specimen collection/handling, submission of specimen other than nasopharyngeal swab, presence of viral mutation(s) within the areas targeted by this assay, and inadequate number of viral copies (<131 copies/mL). A negative result must be combined with clinical observations, patient history, and epidemiological information. The expected result is Negative. Fact Sheet for Patients:  PinkCheek.be Fact Sheet for Healthcare Providers:  GravelBags.it This test is not yet ap proved or cleared by the [place] FDA and  has been authorized for detection and/or diagnosis of SARS-CoV-2 by FDA under an Emergency Use Authorization (EUA). This EUA will remain  in effect (meaning this test can be used) for the duration of the COVID-19 declaration under Section 564(b)(1) of the Act, 21 U.S.C. section 360bbb-3(b)(1), unless the authorization is terminated or revoked sooner.    Influenza A by PCR NEGATIVE NEGATIVE Final   Influenza B by PCR NEGATIVE NEGATIVE Final    Comment: (NOTE) The Xpert Xpress SARS-CoV-2/FLU/RSV assay is intended as an aid in  the diagnosis of influenza from Nasopharyngeal swab specimens and  should not be used as a sole basis for treatment. Nasal washings and  aspirates are unacceptable for Xpert Xpress SARS-CoV-2/FLU/RSV  testing. Fact Sheet for Patients: PinkCheek.be Fact Sheet for Healthcare Providers: GravelBags.it This test is not yet approved or cleared by the [place] FDA and   has been authorized for detection and/or diagnosis of SARS-CoV-2 by  FDA under an Emergency Use Authorization (EUA). This EUA will remain  in effect (meaning this test can be used) for the duration of the  Covid-19 declaration under Section 564(b)(1) of the Act, 21  U.S.C. section 360bbb-3(b)(1), unless the authorization  is  terminated or revoked. Performed at [facility], [address] [place], [place] [address]   MRSA PCR Screening     Status: None   Collection Time: 12/05/19  9:52 PM   Specimen: Nasopharyngeal  Result Value Ref Range Status   MRSA by PCR NEGATIVE NEGATIVE Final    Comment:        The GeneXpert MRSA Assay (FDA approved for NASAL specimens only), is one component of a comprehensive MRSA colonization surveillance program. It is not intended to diagnose MRSA infection nor to guide or monitor treatment for MRSA infections. Performed at [facility], [place], [place] [address]   SARS CORONAVIRUS 2 (TAT 6-24 HRS) Nasopharyngeal Nasopharyngeal Swab     Status: None   Collection Time: 12/06/19  7:48 PM   Specimen: Nasopharyngeal Swab  Result Value Ref Range Status   SARS Coronavirus 2 NEGATIVE NEGATIVE Final    Comment: (NOTE) SARS-CoV-2 target nucleic acids are NOT DETECTED. The SARS-CoV-2 RNA is generally detectable in upper and lower respiratory specimens during the acute phase of infection. Negative results do not preclude SARS-CoV-2 infection, do not rule out co-infections with other pathogens, and should not be used as the sole basis for treatment or other patient management decisions. Negative results must be combined with clinical observations, patient history, and epidemiological information. The expected result is Negative. Fact Sheet for Patients: SugarRoll.be Fact Sheet for Healthcare Providers: https://www.woods-mathews.com/ This test is not yet approved or cleared by  the [place] FDA and  has been authorized for detection and/or diagnosis of SARS-CoV-2 by FDA under an Emergency Use Authorization (EUA). This EUA will remain  in effect (meaning this test can be used) for the duration of the COVID-19 declaration under Section 56 4(b)(1) of the Act, 21 U.S.C. section 360bbb-3(b)(1), unless the authorization is terminated or revoked sooner. Performed at [facility], [place] [address] [place], [place] [address]   Respiratory Panel by RT PCR (Flu A&B, Covid) - Nasopharyngeal Swab     Status: None   Collection Time: 12/07/19  3:30 AM   Specimen: Nasopharyngeal Swab  Result Value Ref Range Status   SARS Coronavirus 2 by RT PCR NEGATIVE NEGATIVE Final    Comment: (NOTE) SARS-CoV-2 target nucleic acids are NOT DETECTED. The SARS-CoV-2 RNA is generally detectable in upper respiratoy specimens during the acute phase of infection. The lowest concentration of SARS-CoV-2 viral copies this assay can detect is 131 copies/mL. A negative result does not preclude SARS-Cov-2 infection and should not be used as the sole basis for treatment or other patient management decisions. A negative result may occur with  improper specimen collection/handling, submission of specimen other than nasopharyngeal swab, presence of viral mutation(s) within the areas targeted by this assay, and inadequate number of viral copies (<131 copies/mL). A negative result must be combined with clinical observations, patient history, and epidemiological information. The expected result is Negative. Fact Sheet for Patients:  PinkCheek.be Fact Sheet for Healthcare Providers:  GravelBags.it This test is not yet ap proved or cleared by the [place] FDA and  has been authorized for detection and/or diagnosis of SARS-CoV-2 by FDA under an Emergency Use Authorization (EUA). This EUA will remain  in effect (meaning this test can  be used) for the duration of the COVID-19 declaration under Section 564(b)(1) of the Act, 21 U.S.C. section 360bbb-3(b)(1), unless the authorization is terminated or revoked sooner.    Influenza A by PCR NEGATIVE NEGATIVE Final  Influenza B by PCR NEGATIVE NEGATIVE Final    Comment: (NOTE) The Xpert Xpress SARS-CoV-2/FLU/RSV assay is intended as an aid in  the diagnosis of influenza from Nasopharyngeal swab specimens and  should not be used as a sole basis for treatment. Nasal washings and  aspirates are unacceptable for Xpert Xpress SARS-CoV-2/FLU/RSV  testing. Fact Sheet for Patients: PinkCheek.be Fact Sheet for Healthcare Providers: GravelBags.it This test is not yet approved or cleared by the [place] FDA and  has been authorized for detection and/or diagnosis of SARS-CoV-2 by  FDA under an Emergency Use Authorization (EUA). This EUA will remain  in effect (meaning this test can be used) for the duration of the  Covid-19 declaration under Section 564(b)(1) of the Act, 21  U.S.C. section 360bbb-3(b)(1), unless the authorization is  terminated or revoked. Performed at [facility], [address] [place], [place] [address]     Coagulation Studies: No results for input(s): LABPROT, INR in the last 72 hours.  Urinalysis: No results for input(s): COLORURINE, LABSPEC, PHURINE, GLUCOSEU, HGBUR, BILIRUBINUR, KETONESUR, PROTEINUR, UROBILINOGEN, NITRITE, LEUKOCYTESUR in the last 168 hours.  Invalid input(s): APPERANCEUR  Lipid Panel:     Component Value Date/Time   CHOL 111 10/05/2018 0901   TRIG 137 10/05/2018 0901   HDL 41 10/05/2018 0901   CHOLHDL 2.7 10/05/2018 0901   CHOLHDL 2.9 11/02/2011 0858   VLDL 27 11/02/2011 0858   LDLCALC 43 10/05/2018 0901    HgbA1C:  Lab Results  Component Value Date   HGBA1C 8.1 (H) 12/06/2019    Urine Drug Screen:  No results found for: LABOPIA, COCAINSCRNUR,  LABBENZ, AMPHETMU, THCU, LABBARB  Alcohol Level: No results for input(s): ETH in the last 168 hours.  Other results: EKG: A fib rate 100.  Imaging: MR BRAIN WO CONTRAST  Result Date: 12/06/2019 CLINICAL DATA:  Head trauma.  Delayed recovery. EXAM: MRI HEAD WITHOUT CONTRAST TECHNIQUE: Multiplanar, multiecho pulse sequences of the brain and surrounding structures were obtained without intravenous contrast. COMPARISON:  CT studies 12/08/2019. MRI 08/21/2010. Cervical spine CT 08/21/2010 FINDINGS: Brain: Diffusion imaging shows 3 definite punctate acute infarctions in the right parietal vertex. There is question of a punctate acute infarction at the left parietal vertex, but this is not certain. Findings are consistent with micro embolic disease. If bilateral, this could be from the heart or ascending aorta. If unilateral, this could be from the right carotid bifurcation. Elsewhere, the brain shows mild age related volume loss. The brain does not show a widespread pattern of chronic small vessel disease. No large vessel territory infarction. No mass lesion, hemorrhage, hydrocephalus or extra-axial collection. Vascular: Major vessels at the base of the brain show flow. Skull and upper cervical spine: Skull base appears negative. There is an abnormal appearance of the dens. The patient had a dens fracture in 2011 and this may be nonunited. Sinuses/Orbits: Clear/normal Other: None IMPRESSION: Three punctate acute infarctions at the right parietal vertex. Question of a single punctate infarction at the left parietal vertex, not definite. If unilateral, this could be embolic disease from the heart, ascending aorta or right carotid system. If bilateral, this could be from the heart or ascending aorta. Otherwise normal appearance of the brain for age. Abnormal appearance of the dens. 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 Cannot rule out repeat fracture in the setting of  recent injury. Electronically Signed   By: Nelson Chimes M.D.   On: 12/06/2019 21:26  DG Chest Port 1 View  Result Date: 12/07/2019 CLINICAL DATA:  Hypoxia EXAM: PORTABLE CHEST 1 VIEW COMPARISON:  CTA chest 11/30/2019 chest radiograph 12/14/2019 FINDINGS: Bilateral mixed interstitial and airspace opacities throughout both lungs appear to have acutely worsened since the comparison imaging lung volumes are also somewhat diminished suggesting some of this may be due to increasing atelectatic change. Much of the cardiomediastinal silhouette is obscured by overlying opacity. Sternotomy wires are intact and aligned with few surgical clips projecting over the mediastinum. Telemetry leads overlie the chest. Vascular calcium noted in the base of the neck. No acute osseous or soft tissue abnormality. IMPRESSION: 1. Bilateral mixed interstitial and airspace opacities appear to have acutely worsened since the comparison imaging. May reflect worsening infectious consolidation, edema, and/or atelectasis given diminishing volumes. Electronically Signed   By: Lovena Le M.D.   On: 12/07/2019 02:32   ECHOCARDIOGRAM COMPLETE  Result Date: 12/05/2019   ECHOCARDIOGRAM REPORT   Patient Name:   Michael Lucas Date of Exam: 12/05/2019 Medical Rec #:  [id number]      Height:       68.0 in Accession #:    [id number]     Weight:       207.1 lb Date of Birth:  12/01/1945      BSA:          2.07 m Patient Age:    54 years       BP:           145/112 mmHg Patient Gender: M              HR:           128 bpm. Exam Location:  [facility] Procedure: 2D Echo, Cardiac Doppler and Color Doppler Indications:     CHF- acute diastolic [id number]  History:         Patient has prior history of Echocardiogram examinations, most                  recent 05/04/2016. Signs/Symptoms:Murmur; Risk                  Factors:Hypertension. PAF.  Sonographer:     Sherrie Sport RDCS (AE) Referring Phys:  [place] Diagnosing Phys: Ida Rogue MD  Sonographer Comments: No  apical window and no subcostal window. Pt Could not wake up and communicate --- moving arms and legs during echo. IMPRESSIONS  1. Left ventricular ejection fraction, by visual estimation, is 60 to 65%. The left ventricle has normal function. There is no left ventricular hypertrophy.  2. The left ventricle has no regional wall motion abnormalities.  3. Global right ventricle has normal systolic function.The right ventricular size is normal. No increase in right ventricular wall thickness.  4. Left atrial size was moderately dilated.  5. TR signal is inadequate for assessing pulmonary artery systolic pressure. FINDINGS  Left Ventricle: Left ventricular ejection fraction, by visual estimation, is 60 to 65%. The left ventricle has normal function. The left ventricle has no regional wall motion abnormalities. There is no left ventricular hypertrophy. Normal left atrial pressure. Right Ventricle: The right ventricular size is normal. No increase in right ventricular wall thickness. Global RV systolic function is has normal systolic function. Left Atrium: Left atrial size was moderately dilated. Right Atrium: Right atrial size was normal in size Pericardium: There is no evidence of pericardial effusion. Mitral Valve: The mitral valve is normal in structure. Mild to moderate mitral valve regurgitation. No evidence of mitral valve  stenosis by observation. Tricuspid Valve: The tricuspid valve is normal in structure. Tricuspid valve regurgitation is not demonstrated. Aortic Valve: The aortic valve was not well visualized. Aortic valve regurgitation is not visualized. Mild aortic valve sclerosis is present, with no evidence of aortic valve stenosis. Pulmonic Valve: The pulmonic valve was normal in structure. Pulmonic valve regurgitation is not visualized. Pulmonic regurgitation is not visualized. Aorta: The aortic root, ascending aorta and aortic arch are all structurally normal, with no evidence of dilitation or obstruction.  Venous: The inferior vena cava is normal in size with greater than 50% respiratory variability, suggesting right atrial pressure of 3 mmHg. IAS/Shunts: No atrial level shunt detected by color flow Doppler. There is no evidence of a patent foramen ovale. No ventricular septal defect is seen or detected. There is no evidence of an atrial septal defect.  LEFT VENTRICLE PLAX 2D LVIDd:         4.23 cm LVIDs:         2.81 cm LV PW:         1.22 cm LV IVS:        1.16 cm LVOT diam:     2.10 cm LV SV:         50 ml LV SV Index:   23.28 LVOT Area:     3.46 cm  LEFT ATRIUM         Index LA diam:    5.00 cm 2.41 cm/m                        PULMONIC VALVE AORTA                 PV Vmax:        0.61 m/s Ao Root diam: 3.30 cm PV Peak grad:   1.5 mmHg                       RVOT Peak grad: 4 mmHg   SHUNTS Systemic Diam: 2.10 cm  Ida Rogue MD Electronically signed by Ida Rogue MD Signature Date/Time: 12/05/2019/5:39:27 PM    Final      Assessment/Plan:  74 y.o. male a known history of CAD status post CABG, history of atrial flutter/relation status post ablation 2010, hypertension, gait instability with peripheral neuropathy, depression and chronic CHF diastolic comes to the emergency room with increasing shortness of breath for last couple days. Patient does not use oxygen at baseline. Patient was found to be confused and found to be in A. fib with RVR and acute on chronic heart failure. Patient is being treated for hypoxia/PNA.  MRI embolic strokes.  - Mentation improved as per reading notes. Able to tell me where he is and his name. Follows commands - MRI with embolic strokes in the setting of Afib with RVR that has improved - Con't xarelto - Stroke in setting of A-fib despite anticoagulation. No need change anticoagulation  - Mentation likely metabolic as embolic strokes very small  - No further imaging neurological stand point.   Leotis Pain  12/07/2019, 10:10 AM

## 2019-12-07 NOTE — Progress Notes (Signed)
Pharmacy Antibiotic Note  Michael Lucas is a 74 y.o. male admitted on 12/19/2019 with pneumonia.  Pharmacy has been consulted for Vancomycin and Cefepime dosing.  Plan: Cefepime 2gm IV q8hrs Vancomycin 2000mg  x 1 loading dose  Vancomycin 1000 mg IV Q 24 hrs. Goal AUC 400-550. Expected AUC: 502.3 SCr used: 1.35  Height: 5\' 8"  (172.7 cm) Weight: 204 lb 5.9 oz (92.7 kg) IBW/kg (Calculated) : 68.4  Temp (24hrs), Avg:98.6 F (37 C), Min:98.6 F (37 C), Max:98.6 F (37 C)  Recent Labs  Lab 12/05/2019 1252 12/05/19 0313 12/06/19 0256  WBC 10.7* 11.6* 12.6*  CREATININE 1.24 1.29* 1.35*  LATICACIDVEN 1.2  --   --     Estimated Creatinine Clearance: 53 mL/min (A) (by C-G formula based on SCr of 1.35 mg/dL (H)).    No Known Allergies  Antimicrobials this admission: Zithromax 1/6 >> 1/7 Rocephin x 1 on  1/8 Cefepime 1/8 >> Vancomycin 1/8 >>  Dose adjustments this admission:   Microbiology results:  BCx:   UCx:    Sputum:    MRSA PCR:   Thank you for allowing pharmacy to be a part of this patient's care.  Hart Robinsons A 12/07/2019 4:02 AM

## 2019-12-07 NOTE — Progress Notes (Addendum)
Progress Note  Patient Name: Michael Lucas Date of Encounter: 12/07/2019  Primary Cardiologist: Kathlyn Sacramento, MD  Subjective   Remains dyspneic @ rest w/ intermittent desats into 80's.  No c/p.  Inpatient Medications    Scheduled Meds: . aspirin EC  81 mg Oral Daily  . atorvastatin  80 mg Oral QPM  . Chlorhexidine Gluconate Cloth  6 each Topical Daily  . cholecalciferol  1,000 Units Oral Daily  . docusate sodium  200 mg Oral Daily  . Glucerna  237 mL Oral AC breakfast  . influenza vaccine adjuvanted  0.5 mL Intramuscular Tomorrow-1000  . insulin aspart  0-20 Units Subcutaneous TID WC  . insulin aspart  0-5 Units Subcutaneous QHS  . insulin glargine  40 Units Subcutaneous QHS  . levothyroxine  50 mcg Oral QAC breakfast  . lidocaine  1 patch Transdermal Q24H  . LORazepam  2 mg Intravenous Once  . methadone  10 mg Oral BID  . metoprolol tartrate  100 mg Oral BID  . potassium chloride  40 mEq Oral Once  . prazosin  6 mg Oral QHS  . predniSONE  40 mg Oral Q breakfast  . pregabalin  75 mg Oral BID  . QUEtiapine  50 mg Oral QHS  . rivaroxaban  20 mg Oral Q supper  . sertraline  100 mg Oral Daily  . sodium chloride flush  3 mL Intravenous Q12H   Continuous Infusions: . sodium chloride    . ceFEPime (MAXIPIME) IV    . doxycycline (VIBRAMYCIN) IV 100 mg (12/07/19 0848)  . [START ON 12/08/2019] vancomycin     PRN Meds: sodium chloride, acetaminophen, ipratropium-albuterol, ondansetron (ZOFRAN) IV, phenol, sodium chloride flush   Vital Signs    Vitals:   12/07/19 0630 12/07/19 0642 12/07/19 0730 12/07/19 0800  BP: 99/76  109/79   Pulse: 96  87   Resp: 20  (!) 21   Temp:    (!) 96.7 F (35.9 C)  TempSrc:    Axillary  SpO2: (!) 87% 91% 93%   Weight:      Height:        Intake/Output Summary (Last 24 hours) at 12/07/2019 1007 Last data filed at 12/07/2019 0321 Gross per 24 hour  Intake 815.5 ml  Output 850 ml  Net -34.5 ml   Filed Weights   12/16/2019 1730 12/06/19  0500 12/07/19 0500  Weight: 93.9 kg 92.7 kg 92 kg    Physical Exam   GEN: Well nourished, well developed, in no acute distress.  HEENT: Grossly normal.  Neck: Supple, no JVD, carotid bruits, or masses. Cardiac: IR, IR, no murmurs, rubs, or gallops. No clubbing, cyanosis, edema.  Radials/DP/PT 2+ and equal bilaterally.  Respiratory:  Respirations regular and unlabored, bibasilar crackles. GI: Soft, nontender, nondistended, BS + x 4. MS: no deformity or atrophy. Skin: warm and dry, no rash. Neuro:  Strength and sensation are intact. Psych: AAOx3.  Normal affect.  Labs    Chemistry Recent Labs  Lab 12/27/2019 1252 12/05/19 0313 12/06/19 0256 12/07/19 0408  NA 131* 132* 133* 134*  K 3.7 3.9 3.8 3.5  CL 96* 97* 95* 96*  CO2 21* 24 27 26   GLUCOSE 179* 104* 165* 65*  BUN 18 21 27* 37*  CREATININE 1.24 1.29* 1.35* 1.44*  CALCIUM 8.8* 8.3* 8.3* 8.4*  PROT 7.8  --   --  6.8  ALBUMIN 3.6  --   --  3.0*  AST 38  --   --  32  ALT 28  --   --  24  ALKPHOS 146*  --   --  172*  BILITOT 0.9  --   --  0.5  GFRNONAA 57* 54* 51* 47*  GFRAA >60 >60 60* 55*  ANIONGAP 14 11 11 12      Hematology Recent Labs  Lab 12/05/19 0313 12/06/19 0256 12/07/19 0408  WBC 11.6* 12.6* 15.2*  RBC 3.64* 3.81* 3.95*  HGB 10.4* 10.7* 11.0*  HCT 31.4* 33.0* 33.9*  MCV 86.3 86.6 85.8  MCH 28.6 28.1 27.8  MCHC 33.1 32.4 32.4  RDW 13.2 13.2 13.1  PLT 260 270 282    Cardiac Enzymes  Recent Labs  Lab 12/05/19 0313 12/05/19 0619 12/05/19 0810  TROPONINIHS 87* 89* 95*      BNP Recent Labs  Lab 12/07/2019 1252 12/05/19 0313 12/07/19 0408  BNP 276.0* 421.0* 327.0*     Radiology    CT HEAD WO CONTRAST  Result Date: 12/15/2019 CLINICAL DATA:  Headaches EXAM: CT HEAD WITHOUT CONTRAST TECHNIQUE: Contiguous axial images were obtained from the base of the skull through the vertex without intravenous contrast. COMPARISON:  10/24/2013 FINDINGS: Brain: Mild atrophic changes and chronic white matter  ischemic changes are seen. No findings to suggest acute hemorrhage, acute infarction or space-occupying mass lesion are noted. Vascular: No hyperdense vessel or unexpected calcification. Skull: Normal. Negative for fracture or focal lesion. Sinuses/Orbits: No acute finding. Other: None. IMPRESSION: Chronic atrophic and ischemic changes without acute abnormality. Electronically Signed   By: Inez Catalina M.D.   On: 12/01/2019 20:46   CT ANGIO CHEST PE W OR WO CONTRAST  Result Date: 12/29/2019 CLINICAL DATA:  Hypoxemia EXAM: CT ANGIOGRAPHY CHEST WITH CONTRAST TECHNIQUE: Multidetector CT imaging of the chest was performed using the standard protocol during bolus administration of intravenous contrast. Multiplanar CT image reconstructions and MIPs were obtained to evaluate the vascular anatomy. CONTRAST:  4mL OMNIPAQUE IOHEXOL 350 MG/ML SOLN COMPARISON:  Chest x-ray from earlier in the same day. FINDINGS: Cardiovascular: Thoracic aorta demonstrates atherosclerotic calcifications without aneurysmal dilatation or dissection. Coronary calcifications are seen. No cardiac enlargement is seen. No pericardial effusion is noted. The pulmonary artery shows a normal branching pattern without intraluminal filling defect to suggest pulmonary embolism. Mediastinum/Nodes: Thoracic inlet is within normal limits. Scattered mediastinal adenopathy is noted most prominent in the region of the right paratracheal stripe. A dominant node is seen measuring 2 cm in short axis. Scattered smaller mediastinal nodes are seen. Subcarinal lymph node measures almost 18 mm in short axis. Small hilar lymph nodes are seen. These changes are increased from a prior CT from 2010. The esophagus is within normal limits. Lungs/Pleura: Some fibrotic changes are noted in the lungs bilaterally although diffuse ground-glass opacities are seen with some interstitial thickening most consistent with atypical or viral pneumonia. Mild superimposed edema is likely  present as well. No sizable effusion is noted. Upper Abdomen: Visualized upper abdomen is within normal limits. Musculoskeletal: Degenerative changes of the thoracic spine are seen. No acute rib abnormality is noted. Review of the MIP images confirms the above findings. IMPRESSION: No evidence of pulmonary emboli. Patchy ground-glass infiltrates with interstitial thickening likely related to patchy edema. Patient has a negative COVID-19 test. Atypical pneumonia may also be superimposed as well. Mediastinal and hilar adenopathy likely reactive given the appearance of the lungs. Aortic Atherosclerosis (ICD10-I70.0) and Emphysema (ICD10-J43.9). Electronically Signed   By: Inez Catalina M.D.   On: 12/13/2019 20:53   MR BRAIN WO CONTRAST  Result Date:  12/06/2019 CLINICAL DATA:  Head trauma.  Delayed recovery. EXAM: MRI HEAD WITHOUT CONTRAST TECHNIQUE: Multiplanar, multiecho pulse sequences of the brain and surrounding structures were obtained without intravenous contrast. COMPARISON:  CT studies 12/08/2019. MRI 08/21/2010. Cervical spine CT 08/21/2010 FINDINGS: Brain: Diffusion imaging shows 3 definite punctate acute infarctions in the right parietal vertex. There is question of a punctate acute infarction at the left parietal vertex, but this is not certain. Findings are consistent with micro embolic disease. If bilateral, this could be from the heart or ascending aorta. If unilateral, this could be from the right carotid bifurcation. Elsewhere, the brain shows mild age related volume loss. The brain does not show a widespread pattern of chronic small vessel disease. No large vessel territory infarction. No mass lesion, hemorrhage, hydrocephalus or extra-axial collection. Vascular: Major vessels at the base of the brain show flow. Skull and upper cervical spine: Skull base appears negative. There is an abnormal appearance of the dens. The patient had a dens fracture in 2011 and this may be nonunited. Sinuses/Orbits:  Clear/normal Other: None IMPRESSION: Three punctate acute infarctions at the right parietal vertex. Question of a single punctate infarction at the left parietal vertex, not definite. If unilateral, this could be embolic disease from the heart, ascending aorta or right carotid system. If bilateral, this could be from the heart or ascending aorta. Otherwise normal appearance of the brain for age. Abnormal appearance of the dens. 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 Cannot rule out repeat fracture in the setting of recent injury. Electronically Signed   By: Nelson Chimes M.D.   On: 12/06/2019 21:26   DG Chest Port 1 View  Result Date: 12/07/2019 CLINICAL DATA:  Hypoxia EXAM: PORTABLE CHEST 1 VIEW COMPARISON:  CTA chest 12/29/2019 chest radiograph 12/11/2019 FINDINGS: Bilateral mixed interstitial and airspace opacities throughout both lungs appear to have acutely worsened since the comparison imaging lung volumes are also somewhat diminished suggesting some of this may be due to increasing atelectatic change. Much of the cardiomediastinal silhouette is obscured by overlying opacity. Sternotomy wires are intact and aligned with few surgical clips projecting over the mediastinum. Telemetry leads overlie the chest. Vascular calcium noted in the base of the neck. No acute osseous or soft tissue abnormality. IMPRESSION: 1. Bilateral mixed interstitial and airspace opacities appear to have acutely worsened since the comparison imaging. May reflect worsening infectious consolidation, edema, and/or atelectasis given diminishing volumes. Electronically Signed   By: Lovena Le M.D.   On: 12/07/2019 02:32   DG Chest Port 1 View  Result Date: 12/08/2019 CLINICAL DATA:  Shortness of breath. EXAM: PORTABLE CHEST 1 VIEW COMPARISON:  Chest x-ray 10/24/2013. FINDINGS: Prior median sternotomy. Borderline cardiomegaly. Diffuse severe bilateral pulmonary infiltrates. No pleural effusion  or pneumothorax. No acute bony abnormality. IMPRESSION: 1. Diffuse severe bilateral pulmonary infiltrates/edema. Bilateral multifocal pneumonia and or pulmonary edema could present this fashion. 2.  Prior median sternotomy.  Borderline cardiomegaly. Electronically Signed   By: Marcello Moores  Register   On: 12/16/2019 13:21   Telemetry    Afib, 80's to 90's - Personally Reviewed  Cardiac Studies   2D Echocardiogram 1.6.2021  IMPRESSIONS     1. Left ventricular ejection fraction, by visual estimation, is 60 to 65%. The left ventricle has normal function. There is no left ventricular hypertrophy.  2. The left ventricle has no regional wall motion abnormalities.  3. Global right ventricle has normal systolic function.The right ventricular size is normal.  No increase in right ventricular wall thickness.  4. Left atrial size was moderately dilated.  5. TR signal is inadequate for assessing pulmonary artery systolic pressure. _____________    Patient Profile     73 y.o.malewith a hx of CAD status post one-vessel CABG in 1995 with LIMA to LAD, atrial flutter status post ablation in 2010, chronic A. fib previously on Coumadin though discontinued in 2011 secondary to frequent falls now on Xarelto and tolerating, HFpEF, pulmonary hypertension, CKD stage III, HTN, HLD, gait instability with peripheral neuropathy, mild nonobstructive bilateral carotid artery disease, and OSA intolerant to CPAPwho was admitted 1/5 due to resp failure and acute on chronic HFpEF and atypical pna.  Assessment & Plan    1.  Acute hypoxic resp failure/Atypical PNA:  Presented 1/5 due to dyspnea/hypoxia w/ sats in the 70's on RA.  CXR concerning for multifocal pna w/ some degree of edema.  Covid neg x 2.  Despite diuresis, cont to desat. Abx, nebs, steroids per pulm.  2.  Acute on chronic HFpEF:  Mild volume overload on admission.  However, desaturations and resp failure out of proportion to volume excess. Net + 165.5 yesterday;  minus 1L since admission.  Wt down to 92kg from 93.9 on admission.  Creat up further this AM to 1.44.  Will hold lasix.  Was taking lasix 20 daily prn prior to admission.  Will have to watch volume closely in the setting of steroids.  HR/BP stable on  blocker.  3.  Permanent Afib: Rates stable on  blocker. CHA2DS2VASc = 4.  Cont xarelto.  Will have to watch creat/CrCl closely as if worsening creat persists, we will need to reduce xarelto dose (CrCl 49.5 ml/min currently).  4.  CKD III:  Creat bumping further (now 1.44) w/ IV lasix.  Will hold today.    5.  Fall:  Fell 1/5 and hit his head. CT head neg for bleed.    6.  CAD/elev HsTroponin:  S/p prior CABG.  No c/p.  HsTrop mildly elevated in setting of above w/ flat trend - 87  89  95.  Echo w/ nl EF and w/o wall motion abnormalities.  Suspect demand ischemia.  No role for ischemic eval @ this time.  Cont  blocker, statin.    7.  HL:  LDL 43 in 09/2018.  Cont statin.  8.  Acute embolic strokes:  Seen by neuro.  ASA started. On xarelto.  Signed, Murray Hodgkins, NP  12/07/2019, 10:07 AM    For questions or updates, please contact   Please consult [email or web address] for contact info under Cardiology/STEMI.

## 2019-12-07 NOTE — Progress Notes (Signed)
Inpatient Diabetes Program Recommendations  AACE/ADA: New Consensus Statement on Inpatient Glycemic Control (2015)  Target Ranges:  Prepandial:   less than 140 mg/dL      Peak postprandial:   less than 180 mg/dL (1-2 hours)      Critically ill patients:  140 - 180 mg/dL   Lab Results  Component Value Date   GLUCAP 153 (H) 12/07/2019   HGBA1C 8.1 (H) 12/19/2019    Review of Glycemic Control Results for Michael Lucas, Michael Lucas (MRN [id number]) as of 12/07/2019 13:53  Ref. Range 12/07/2019 04:08  Glucose Latest Ref Range: 70 - 99 mg/dL 65 (L)   Diabetes history: DM 2 Outpatient Diabetes medications:  Lantus 46 units q HS, Metformin 500 mg with breakfast,  Current orders for Inpatient glycemic control:  Novolog resistant tid with meals and HS Lantus 40 units q HS  Inpatient Diabetes Program Recommendations:    Note low fasting glucose this AM.  Consider reducing Lantus to 36 units q HS.   Thanks,  Adah Perl, RN, BC-ADM Inpatient Diabetes Coordinator Pager [phone] (8a-5p)

## 2019-12-07 NOTE — Consult Note (Signed)
Name: Michael Lucas MRN: [id number] DOB: Aug 15, 1946    ADMISSION DATE:  12/07/2019 CONSULTATION DATE:  12/07/2019  REFERRING MD : Dr. Rockey Situ  CHIEF COMPLAINT: Shortness of breath, persistent hypoxia  BRIEF PATIENT DESCRIPTION:  74 year old male admitted 12/01/2019 with acute hypoxic respiratory failure felt to be attributed to acute decompensated HFpEF and atrial fibrillation with RVR.  CT scan with patchy groundglass infiltrates concerning for pulmonary edema versus atypical/viral pneumonia, but initial COVID-19 PCR is negative.  Cardiology was consulted, but despite aggressive diuresis he continues to have increased FiO2 requirements and rapid desaturations off oxygen.    SIGNIFICANT EVENTS  1/5-admission the MedSurg unit 1/5-fell and hit head; CTH post fall is negative 1/6- Dyspneic, hypoxic, with increasing FiO2 requirments, transfer to stepdown 1/8-persistent hypoxia despite aggressive diuresis, PCCM consulted  STUDIES:  1/5-CTA chest>>No evidence of pulmonary emboli. Patchy ground-glass infiltrates with interstitial thickening likely related to patchy edema. Patient has a negative COVID-19 test. Atypical pneumonia may also be superimposed as well. Mediastinal and hilar adenopathy likely reactive given the appearance of the lungs. Aortic Atherosclerosis 1/5-CT head without contrast>>Chronic atrophic and ischemic changes without acute abnormality. 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 Question of a single punctate infarction at the left parietal vertex, not definite. If unilateral, this could be embolic disease from the heart, ascending aorta or right carotid system. If bilateral, this could be from the heart or ascending aorta. Otherwise normal appearance of the brain for age. Abnormal appearance of the dens. 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 Cannot rule out repeat  fracture in the setting of recent injury. 1/8- CXR>> 1. Bilateral mixed interstitial and airspace opacities appear to have acutely worsened since the comparison imaging. May reflect worsening infectious consolidation, edema, and/or atelectasis given diminishing volumes.  CULTURES: SARS-CoV-2 Ag 1/5>> negative Influenza PCR 1/5>> negative SARS-CoV-2 PCR 1/5>> negative Blood cultures x2 1/5>> MRSA PCR 1/6>> negative SARS-CoV-2 PCR 1/8>>negative Influenza PCR 1/8>>negative Sputum 1/8>> Strep pneumo urinary antigen 1/8>> Legionella urinary antigen 1/8>>  ANTIBIOTICS: Azithromycin 1/6>>1/8 Doxycycline 1/8>> Cefepime 1/8>> Vancomycin 1/8>>  HISTORY OF PRESENT ILLNESS:   Michael Lucas is a 74 year old male with a past medical history notable for CAD status post CABG 1995, atrial fibrillation on Xarelto, HFpEF, pulmonary hypertension, CKD stage III, hypertension, hyperlipidemia, peripheral neuropathy, and OSA intolerant to CPAP who presented to [facility] ED on 12/26/2019 due to shortness of breath.  He reported work progressive worsening of shortness of breath for a couple days.  He denied fever, chills, cough, loss of taste, diarrhea, or sick contacts.  Upon presentation to the ED he was noted to be , tachycardic, tachypneic, and hypoxic with sats in the 70s.  BNP was elevated at 276, and chest x-ray revealed bilateral pulmonary infiltrates versus edema.  His rapid Covid antigen was negative, along with negative COVID-19 PCR.  He was admitted to the [place] unit by the hospitalist for further work-up and treatment of acute hypoxic respiratory failure in the setting of acute decompensated HFpEF.  Cardiology was consulted for assistance in managing heart failure.  His stay was complicated by altered mental status and a fall late in the evening on 1/5, of which follow-up CT head was negative.  On 1/6 he became very dyspneic and hypoxic with increasing FiO2 requirements, of which she was subsequently  transferred to stepdown unit.  Echocardiogram on 1/6 reveals LVEF 60 to 65% and normal RV systolic function, no pericardial effusion or  evidence of thrombus.  MRI head was obtained on 1/7 which revealed Three punctate acute infarctions at the right parietal vertex, consistent with microembolic disease.  Despite aggressive diuresis he continues to be severely hypoxic with high FiO2 requirements.  Follow-up chest x-ray on 1/8 with worsening bilateral opacities concerning for atypical pneumonia versus COVID-19 infection (despite previous negative Covid PCR) versus atelectasis. PCCM is consulted for further evaluation of acute hypoxic respiratory failure.   PAST MEDICAL HISTORY :   has a past medical history of Anemia, Ankle fracture, left, Anxiety, Arthritis, Atrial flutter (HCC), Carotid arterial disease ([place]), Cervical disc disease, Chronic back pain, Chronic diastolic CHF (congestive heart failure) ([place]), Chronic neck pain, CKD (chronic kidney disease), stage III, Coronary artery disease, Depression, Dermatitis, Diabetes mellitus, Dyspnea, Edema, Fractured fibula, Heart murmur, Hypertensive heart disease, Hypothyroidism, Nephrolithiasis, Orthopnea, OSA (obstructive sleep apnea), Paroxysmal atrial fibrillation ([place]), Peripheral neuropathy, PTSD (post-traumatic stress disorder), Pulmonary embolism ([place]) (2010), Stroke [facility]), and Wears dentures.  has a past surgical history that includes Rotator cuff repair; Coronary artery bypass graft (1995); Hernia repair; Rotator cuff repair; Hemorrhoid surgery; Ablation; Cataract extraction w/PHACO (Right, 11/04/2016); Cataract extraction w/PHACO (Left, 12/02/2016); Colonoscopy with propofol (N/A, 08/26/2017); and polypectomy (08/26/2017). Prior to Admission medications   Medication Sig Start Date End Date Taking? Authorizing Provider  atorvastatin (LIPITOR) 80 MG tablet Take 80 mg by mouth every evening.    Yes [provider]  cholecalciferol (VITAMIN D) 1000  units tablet Take 1,000 Units by mouth daily.   Yes [provider]  docusate sodium (COLACE) 50 MG capsule Take 200 mg by mouth daily.    Yes [provider]  furosemide (LASIX) 20 MG tablet Take 20 mg by mouth daily. As needed   Yes [provider]  insulin glargine (LANTUS) 100 UNIT/ML injection Inject 46 Units into the skin at bedtime.  05/01/14  Yes [provider]  ketoconazole (NIZORAL) 2 % shampoo Apply 1 application topically See admin instructions. Apply shampoo to head every other day 05/01/14  Yes [provider]  levothyroxine (SYNTHROID, LEVOTHROID) 50 MCG tablet Take 50 mcg by mouth daily before breakfast.  01/17/14  Yes [provider]  loratadine (ALLERGY RELIEF) 10 MG tablet Take 10 mg by mouth daily as needed for allergies.   Yes [provider]  metFORMIN (GLUCOPHAGE) 500 MG tablet Take 500 mg by mouth daily with breakfast.    Yes [provider]  methadone (DOLOPHINE) 10 MG tablet Take 10 mg by mouth 2 (two) times daily.    Yes [provider]  metoprolol (LOPRESSOR) 100 MG tablet Take 1 tablet (100 mg total) by mouth 2 (two) times daily. 03/04/15  Yes Wellington Hampshire, MD  prazosin (MINIPRESS) 2 MG capsule Take 6 mg by mouth at bedtime.    Yes [provider]  pregabalin (LYRICA) 150 MG capsule Take 1 capsule (150 mg total) by mouth 2 (two) times daily. 02/04/19 02/04/20 Yes Duanne Guess, PA-C  QUEtiapine (SEROQUEL) 100 MG tablet Take 50 mg by mouth at bedtime.   Yes [provider]  rivaroxaban (XARELTO) 20 MG TABS tablet Take 1 tablet (20 mg total) by mouth daily with supper. 03/04/15  Yes Wellington Hampshire, MD  sertraline (ZOLOFT) 100 MG tablet Take 1 tablet (100 mg total) by mouth daily. 01/24/19  Yes Jerrol Banana., MD   No Known Allergies  FAMILY HISTORY:  family history includes Alcohol abuse in his sister; Cancer in his mother, sister, and sister; Heart  disease in his  father. SOCIAL HISTORY:  reports that he quit smoking about 34 years ago. His smoking use included cigarettes. He has never used smokeless tobacco. He reports that he does not drink alcohol or use drugs.   COVID-19 DISASTER DECLARATION:  FULL CONTACT PHYSICAL EXAMINATION WAS NOT POSSIBLE DUE TO TREATMENT OF COVID-19 AND  CONSERVATION OF PERSONAL PROTECTIVE EQUIPMENT, LIMITED EXAM FINDINGS INCLUDE-  Patient assessed or the symptoms described in the history of present illness.  In the context of the Global COVID-19 pandemic, which necessitated consideration that the patient might be at risk for infection with the SARS-CoV-2 virus that causes COVID-19, Institutional protocols and algorithms that pertain to the evaluation of patients at risk for COVID-19 are in a state of rapid change based on information released by regulatory bodies including the CDC and federal and state organizations. These policies and algorithms were followed during the patient's care while in hospital.  REVIEW OF SYSTEMS:  Positives in BOLD Constitutional: Negative for fever, chills, weight loss, +malaise/fatigue and diaphoresis.  HENT: Negative for hearing loss, ear pain, nosebleeds, congestion, sore throat, neck pain, tinnitus and ear discharge.   Eyes: Negative for blurred vision, double vision, photophobia, pain, discharge and redness.  Respiratory: Negative for cough, hemoptysis, sputum production, shortness of breath, +dsypnea on exertion, wheezing and stridor.   Cardiovascular: Negative for chest pain, palpitations, orthopnea, claudication, leg swelling and PND.  Gastrointestinal: Negative for heartburn, nausea, vomiting, abdominal pain, diarrhea, constipation, blood in stool and melena.  Genitourinary: Negative for dysuria, urgency, frequency, hematuria and flank pain.  Musculoskeletal: Negative for myalgias, back pain, joint pain and falls.  Skin: Negative for itching and rash.  Neurological: Negative for  dizziness, tingling, tremors, sensory change, speech change, focal weakness, seizures, loss of consciousness, weakness and headaches.  Endo/Heme/Allergies: Negative for environmental allergies and polydipsia. Does not bruise/bleed easily.  SUBJECTIVE:  Patient reports dyspnea on exertion, and feeling really tired Denies shortness of breath at rest, denies chest pain, palpitations, lightheadedness, abdominal pain, nausea vomiting, fever chills Currently on high flow nasal cannula and nonrebreather mask  VITAL SIGNS: Temp:  [98.6 F (37 C)] 98.6 F (37 C) (01/07 1200) Pulse Rate:  [81-87] 81 (01/07 0600) Resp:  [13-19] 17 (01/07 0600) BP: (90-126)/(64-76) 103/66 (01/07 0600) SpO2:  [91 %-97 %] 94 % (01/08 0152) FiO2 (%):  [80 %] 80 % (01/07 1306) Weight:  [92.7 kg] 92.7 kg (01/07 0500)  PHYSICAL EXAMINATION: General: Acutely ill-appearing male, sitting in bed, on high flow nasal cannula and nonrebreather mask, in no acute distress Neuro: Awake, alert and oriented x4, follows commands, no focal deficits, speech clear, pupils PERRLA HEENT: Atraumatic, normocephalic, neck supple, no JVD Cardiovascular: Tachycardia, irregular regular rhythm, 2+ pulses Lungs: Crackles to the bilateral bases, no wheezing, even, mild tachypnea Abdomen: Soft, nontender, nondistended, no guarding or rebound tenderness, bowel sounds present x4 Musculoskeletal: Generalized weakness, normal bulk and tone, no deformities, no edema Skin: Warm and dry, no obvious rashes, lesions or ulcerations  Recent Labs  Lab 12/20/2019 1252 12/05/19 0313 12/06/19 0256  NA 131* 132* 133*  K 3.7 3.9 3.8  CL 96* 97* 95*  CO2 21* 24 27  BUN 18 21 27*  CREATININE 1.24 1.29* 1.35*  GLUCOSE 179* 104* 165*   Recent Labs  Lab 12/18/2019 1252 12/05/19 0313 12/06/19 0256  HGB 11.5* 10.4* 10.7*  HCT 36.2* 31.4* 33.0*  WBC 10.7* 11.6* 12.6*  PLT 297 260 270   MR BRAIN WO CONTRAST  Result Date: 12/06/2019 CLINICAL  DATA:  Head  trauma.  Delayed recovery. EXAM: MRI HEAD WITHOUT CONTRAST TECHNIQUE: Multiplanar, multiecho pulse sequences of the brain and surrounding structures were obtained without intravenous contrast. COMPARISON:  CT studies 12/21/2019. MRI 08/21/2010. Cervical spine CT 08/21/2010 FINDINGS: Brain: Diffusion imaging shows 3 definite punctate acute infarctions in the right parietal vertex. There is question of a punctate acute infarction at the left parietal vertex, but this is not certain. Findings are consistent with micro embolic disease. If bilateral, this could be from the heart or ascending aorta. If unilateral, this could be from the right carotid bifurcation. Elsewhere, the brain shows mild age related volume loss. The brain does not show a widespread pattern of chronic small vessel disease. No large vessel territory infarction. No mass lesion, hemorrhage, hydrocephalus or extra-axial collection. Vascular: Major vessels at the base of the brain show flow. Skull and upper cervical spine: Skull base appears negative. There is an abnormal appearance of the dens. The patient had a dens fracture in 2011 and this may be nonunited. Sinuses/Orbits: Clear/normal Other: None IMPRESSION: Three punctate acute infarctions at the right parietal vertex. Question of a single punctate infarction at the left parietal vertex, not definite. If unilateral, this could be embolic disease from the heart, ascending aorta or right carotid system. If bilateral, this could be from the heart or ascending aorta. Otherwise normal appearance of the brain for age. Abnormal appearance of the dens. 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 Cannot rule out repeat fracture in the setting of recent injury. Electronically Signed   By: Nelson Chimes M.D.   On: 12/06/2019 21:26   ECHOCARDIOGRAM COMPLETE  Result Date: 12/05/2019   ECHOCARDIOGRAM REPORT   Patient Name:   MICKENZIE SCHNEIDER Date of Exam: 12/05/2019 Medical  Rec #:  [id number]      Height:       68.0 in Accession #:    [id number]     Weight:       207.1 lb Date of Birth:  09-05-1946      BSA:          2.07 m Patient Age:    39 years       BP:           145/112 mmHg Patient Gender: M              HR:           128 bpm. Exam Location:  [facility] Procedure: 2D Echo, Cardiac Doppler and Color Doppler Indications:     CHF- acute diastolic [id number]  History:         Patient has prior history of Echocardiogram examinations, most                  recent 05/04/2016. Signs/Symptoms:Murmur; Risk                  Factors:Hypertension. PAF.  Sonographer:     Sherrie Sport RDCS (AE) Referring Phys:  [place] Diagnosing Phys: Ida Rogue MD  Sonographer Comments: No apical window and no subcostal window. Pt Could not wake up and communicate --- moving arms and legs during echo. IMPRESSIONS  1. Left ventricular ejection fraction, by visual estimation, is 60 to 65%. The left ventricle has normal function. There is no left ventricular hypertrophy.  2. The left ventricle has no regional wall motion abnormalities.  3. Global right ventricle has normal systolic function.The right ventricular size is normal. No  increase in right ventricular wall thickness.  4. Left atrial size was moderately dilated.  5. TR signal is inadequate for assessing pulmonary artery systolic pressure. FINDINGS  Left Ventricle: Left ventricular ejection fraction, by visual estimation, is 60 to 65%. The left ventricle has normal function. The left ventricle has no regional wall motion abnormalities. There is no left ventricular hypertrophy. Normal left atrial pressure. Right Ventricle: The right ventricular size is normal. No increase in right ventricular wall thickness. Global RV systolic function is has normal systolic function. Left Atrium: Left atrial size was moderately dilated. Right Atrium: Right atrial size was normal in size Pericardium: There is no evidence of pericardial effusion. Mitral Valve: The mitral  valve is normal in structure. Mild to moderate mitral valve regurgitation. No evidence of mitral valve stenosis by observation. Tricuspid Valve: The tricuspid valve is normal in structure. Tricuspid valve regurgitation is not demonstrated. Aortic Valve: The aortic valve was not well visualized. Aortic valve regurgitation is not visualized. Mild aortic valve sclerosis is present, with no evidence of aortic valve stenosis. Pulmonic Valve: The pulmonic valve was normal in structure. Pulmonic valve regurgitation is not visualized. Pulmonic regurgitation is not visualized. Aorta: The aortic root, ascending aorta and aortic arch are all structurally normal, with no evidence of dilitation or obstruction. Venous: The inferior vena cava is normal in size with greater than 50% respiratory variability, suggesting right atrial pressure of 3 mmHg. IAS/Shunts: No atrial level shunt detected by color flow Doppler. There is no evidence of a patent foramen ovale. No ventricular septal defect is seen or detected. There is no evidence of an atrial septal defect.  LEFT VENTRICLE PLAX 2D LVIDd:         4.23 cm LVIDs:         2.81 cm LV PW:         1.22 cm LV IVS:        1.16 cm LVOT diam:     2.10 cm LV SV:         50 ml LV SV Index:   23.28 LVOT Area:     3.46 cm  LEFT ATRIUM         Index LA diam:    5.00 cm 2.41 cm/m                        PULMONIC VALVE AORTA                 PV Vmax:        0.61 m/s Ao Root diam: 3.30 cm PV Peak grad:   1.5 mmHg                       RVOT Peak grad: 4 mmHg   SHUNTS Systemic Diam: 2.10 cm  Ida Rogue MD Electronically signed by Ida Rogue MD Signature Date/Time: 12/05/2019/5:39:27 PM    Final     ASSESSMENT / PLAN:  Acute Hypoxic Respiratory Failure, initially felt to be related to Acute Decompensated HFpEF,  but despite aggressive diuresis, continues to have persistent hypoxia along with high FiO2 requirements, concern for atypical pneumonia versus questionable COVID-19 infection  (despite previous negative Covid PCR) -Supplemental O2 as needed to maintain O2 sats greater than 88% -BiPAP as needed, wean as tolerated -Follow intermittent ABG and chest x-ray as needed -Chest x-ray on 1/8 with worsening bilateral opacities -Check rapid SARS-CoV-2 PCR>> repeat negative 1/8 -Check inflammatory markers -Sputum culture, strep pneumo urinary  antigen, Legionella urinary antigen -Check CBC with differential and procalcitonin -Bronchodilators -D/c azithromycin; will add doxycycline, cefepime, and Vancomycin for now -Continue steroids -IV Lasix as blood pressure and renal function permits, Cardiology adjusting -Trend BNP -2D echocardiogram 1/6 >> LVEF 60 to 65%, RV normal systolic function, no pericardial effusion  Acute decompensated HFpEF  Atrial fibrillation with RVR ~rate currently controlled Mildly elevated troponin, likely demand ischemia Hx: CAD -Continuous cardiac monitoring -Maintain MAP greater than 65 -Cardiology following, appreciate input -IV Lasix as blood pressure and renal function permits; Cardiology adjusting -Continue metoprolol and Xarelto  Acute encephalopathy ~resolved Acute Punctate infarctions in the right parietal vertex on MR Head 1/7 (Findings are consistent with micro embolic disease) -Frequent neuro checks -Consult neurology, appreciate input -Patient with atrial fibrillation on Xarelto -2D echocardiogram 1/6 without intracardiac thrombus -Aspirin -Obtain carotid ultrasound  CKD III -Monitor I&O's / urinary output -Follow BMP -Ensure adequate renal perfusion -Avoid nephrotoxic agents as able -Replace electrolytes as indicated           DISPOSITION: Stepdown GOALS OF CARE: Full code VTE prophylaxis/anticoagulation: Xarelto UPDATES: Updated patient at bedside 12/07/2019  Darel Hong, [facility] Pager: [phone]   12/07/2019, 2:08 AM

## 2019-12-07 NOTE — Progress Notes (Signed)
PROGRESS NOTE    Michael Lucas  [id number] DOB: 01/29/1946 DOA: 12/13/2019 PCP: Jerrol Banana., MD   Brief Narrative:  Michael Lucas  is a 74 y.o. male with a known history of CAD status post CABG, history of atrial flutter/relation status post ablation 2010, hypertension, gait instability with peripheral neuropathy, depression and chronic CHF diastolic comes to the emergency room with increasing shortness of breath for last couple days. Patient does not use oxygen at baseline. He was found to be in A. fib with RVR and acute on chronic heart failure.  Subjective: Patient was feeling better when seen this morning.  He was alert and oriented and about to eat his lunch.  He was comfortable on heated HF [place]. We discussed that his lung seems quite bad and he might need higher levels of oxygen from this point onward.  Assessment & Plan:   Principal Problem:   Acute on chronic respiratory failure (HCC) Active Problems:   ATRIAL FIBRILLATION   Type 2 diabetes mellitus with diabetic polyneuropathy, with long-term current use of insulin (HCC)   Palliative care status  Acute hypoxic respiratory failure secondary to acute on chronic diastolic congestive heart failure. Cardiology was consulted-appreciate their recommendations. Echo done yesterday and exam are not consistent with volume overload. Patient is off the BiPAP now but requiring higher oxygen. Discontinue Lasix as patient appears euvolemic with worsening renal function. -Continue strict intake and output. -Daily weights.  COPD exacerbation.  Most likely contributory to his hypoxia. Patient with underlying lung condition due to some red dust exposure and with now.  CT chest with worsening groundglass opacities.  Pulmonary was consulted and his antibiotics were broadened to cefepime, doxycycline and vancomycin. There is some concern of his disease progression and he might need higher levels of oxygen now. His inflammatory  markers are quite elevated with D-dimer of 5589, ferritin of 1000.  Repeat Covid testing is negative.  CTA done 2 days ago was negative for PE.  Patient remained full code.  Had a discussion with daughter who wants to involve palliative to discuss goals of care with her dad. -Continue steroid. -Continue with DuoNeb. -Consult palliative care to discuss CODE STATUS and goals of care.  A. fib with RVR.  Patient continued to remain in A. fib with rate control today. -Continue home dose of metoprolol and Xarelto.  Acute encephalopathy.  Seems improving today -CT head obtained after fall last night was negative for any bleed.  Embolic infarct.  MRI brain done yesterday was consistent with small punctate embolic infarcts.  Patient was on Xarelto. Neurology was consulted-no further imaging and he will continue with Xarelto. No focal neurologic deficit.  Type 2 diabetes.  Uncontrolled with A1c of 8.1. CBG within normal range.  Having couple of episodes of hypoglycemia. -Decrease Lantus to 40 units at bedtime. -Continue with sensitive sliding scale.  CAD.  Denies any chest pain.  Troponin mildly elevated most likely secondary to demand. -Continue statin.  Chronic pain.  Patient has an history of chronic back pain and taking methadone and Lyrica at home for many years.  [facility] -Continue home dose of methadone. -Decrease Lyrica to 75 mg twice daily from 150 mg of home dose because of concern of fall and unsteadiness.  History of depression. Continue home dose of Seroquel and Zoloft.  Hypothyroidism. Continue home dose of Synthroid.  Objective: Vitals:   12/07/19 0900 12/07/19 1000 12/07/19 1100 12/07/19 1330  BP: 104/70 123/64 118/80   Pulse: 78 (!)  110 (!) 105   Resp: (!) 21 19 18    Temp:    98 F (36.7 C)  TempSrc:    Axillary  SpO2: 90% 97% (!) 89%   Weight:      Height:        Intake/Output Summary (Last 24 hours) at 12/07/2019 1434 Last data filed at 12/07/2019 1115  Gross per 24 hour  Intake 575.5 ml  Output 650 ml  Net -74.5 ml   Filed Weights   12/22/2019 1730 12/06/19 0500 12/07/19 0500  Weight: 93.9 kg 92.7 kg 92 kg    Examination:  General exam: Chronically ill-appearing elderly man, in no acute distress. Respiratory system: Few scattered crackles bilaterally, respiratory effort normal. Cardiovascular system: Irregularly irregular. Gastrointestinal system: Soft, nontender, nondistended, bowel sounds positive. Central nervous system: Alert and oriented. No focal neurological deficits. Extremities: No edema, no cyanosis, pulses intact and symmetrical. Skin: No rashes, lesions or ulcers Psychiatry: Judgement and insight appear appropriate.  DVT prophylaxis: Xarelto Code Status: Full Family Communication: Daughter was updated at bedside. Disposition Plan: Pending improvement.  Consultants:   Cardiology  Pulmonology  Palliative care  Procedures:  Antimicrobials:  Cefepime Vancomycin Doxycycline  Data Reviewed: I have personally reviewed following labs and imaging studies  CBC: Recent Labs  Lab 11/30/2019 1252 12/05/19 0313 12/06/19 0256 12/07/19 0408  WBC 10.7* 11.6* 12.6* 15.2*  NEUTROABS 8.9*  --   --  12.8*  HGB 11.5* 10.4* 10.7* 11.0*  HCT 36.2* 31.4* 33.0* 33.9*  MCV 87.7 86.3 86.6 85.8  PLT 297 260 270 [id number]   Basic Metabolic Panel: Recent Labs  Lab 12/19/2019 1252 12/05/19 0313 12/06/19 0256 12/07/19 0408  NA 131* 132* 133* 134*  K 3.7 3.9 3.8 3.5  CL 96* 97* 95* 96*  CO2 21* 24 27 26   GLUCOSE 179* 104* 165* 65*  BUN 18 21 27* 37*  CREATININE 1.24 1.29* 1.35* 1.44*  CALCIUM 8.8* 8.3* 8.3* 8.4*  MG  --  1.7  --  2.2   GFR: Estimated Creatinine Clearance: 49.5 mL/min (A) (by C-G formula based on SCr of 1.44 mg/dL (H)). Liver Function Tests: Recent Labs  Lab 12/08/2019 1252 12/07/19 0408  AST 38 32  ALT 28 24  ALKPHOS 146* 172*  BILITOT 0.9 0.5  PROT 7.8 6.8  ALBUMIN 3.6 3.0*   No results for  input(s): LIPASE, AMYLASE in the last 168 hours. No results for input(s): AMMONIA in the last 168 hours. Coagulation Profile: No results for input(s): INR, PROTIME in the last 168 hours. Cardiac Enzymes: No results for input(s): CKTOTAL, CKMB, CKMBINDEX, TROPONINI in the last 168 hours. BNP (last 3 results) No results for input(s): PROBNP in the last 8760 hours. HbA1C: Recent Labs    12/15/2019 2134  HGBA1C 8.1*   CBG: Recent Labs  Lab 12/06/19 1626 12/06/19 1747 12/06/19 2140 12/07/19 0739 12/07/19 1225  GLUCAP 410* 398* 253* 80 153*   Lipid Profile: No results for input(s): CHOL, HDL, LDLCALC, TRIG, CHOLHDL, LDLDIRECT in the last 72 hours. Thyroid Function Tests: Recent Labs    12/05/19 0313  TSH 4.750*  T4TOTAL 5.5   Anemia Panel: Recent Labs    12/07/19 0408  FERRITIN 1,000*   Sepsis Labs: Recent Labs  Lab 12/24/2019 1252 12/07/19 0408  PROCALCITON 0.21 1.07  LATICACIDVEN 1.2  --     Recent Results (from the past 240 hour(s))  Blood Culture (routine x 2)     Status: None (Preliminary result)   Collection Time: 12/07/2019 12:52 PM  Specimen: BLOOD  Result Value Ref Range Status   Specimen Description BLOOD BLOOD LEFT HAND  Final   Special Requests   Final    BOTTLES DRAWN AEROBIC AND ANAEROBIC Blood Culture results may not be optimal due to an excessive volume of blood received in culture bottles   Culture   Final    NO GROWTH 3 DAYS Performed at [facility], [address], [place], [place] [address]    Report Status PENDING  Incomplete  Blood Culture (routine x 2)     Status: None (Preliminary result)   Collection Time: 12/07/2019 12:57 PM   Specimen: BLOOD  Result Value Ref Range Status   Specimen Description BLOOD LEFT ANTECUBITAL  Final   Special Requests   Final    BOTTLES DRAWN AEROBIC AND ANAEROBIC Blood Culture adequate volume   Culture   Final    NO GROWTH 3 DAYS Performed at [facility], [address]  [place], [place] [address]    Report Status PENDING  Incomplete  Respiratory Panel by RT PCR (Flu A&B, Covid) - Nasopharyngeal Swab     Status: None   Collection Time: 12/07/2019  3:45 PM   Specimen: Nasopharyngeal Swab  Result Value Ref Range Status   SARS Coronavirus 2 by RT PCR NEGATIVE NEGATIVE Final    Comment: (NOTE) SARS-CoV-2 target nucleic acids are NOT DETECTED. The SARS-CoV-2 RNA is generally detectable in upper respiratoy specimens during the acute phase of infection. The lowest concentration of SARS-CoV-2 viral copies this assay can detect is 131 copies/mL. A negative result does not preclude SARS-Cov-2 infection and should not be used as the sole basis for treatment or other patient management decisions. A negative result may occur with  improper specimen collection/handling, submission of specimen other than nasopharyngeal swab, presence of viral mutation(s) within the areas targeted by this assay, and inadequate number of viral copies (<131 copies/mL). A negative result must be combined with clinical observations, patient history, and epidemiological information. The expected result is Negative. Fact Sheet for Patients:  PinkCheek.be Fact Sheet for Healthcare Providers:  GravelBags.it This test is not yet ap proved or cleared by the [place] FDA and  has been authorized for detection and/or diagnosis of SARS-CoV-2 by FDA under an Emergency Use Authorization (EUA). This EUA will remain  in effect (meaning this test can be used) for the duration of the COVID-19 declaration under Section 564(b)(1) of the Act, 21 U.S.C. section 360bbb-3(b)(1), unless the authorization is terminated or revoked sooner.    Influenza A by PCR NEGATIVE NEGATIVE Final   Influenza B by PCR NEGATIVE NEGATIVE Final    Comment: (NOTE) The Xpert Xpress SARS-CoV-2/FLU/RSV assay is intended as an aid in  the diagnosis of influenza from  Nasopharyngeal swab specimens and  should not be used as a sole basis for treatment. Nasal washings and  aspirates are unacceptable for Xpert Xpress SARS-CoV-2/FLU/RSV  testing. Fact Sheet for Patients: PinkCheek.be Fact Sheet for Healthcare Providers: GravelBags.it This test is not yet approved or cleared by the [place] FDA and  has been authorized for detection and/or diagnosis of SARS-CoV-2 by  FDA under an Emergency Use Authorization (EUA). This EUA will remain  in effect (meaning this test can be used) for the duration of the  Covid-19 declaration under Section 564(b)(1) of the Act, 21  U.S.C. section 360bbb-3(b)(1), unless the authorization is  terminated or revoked. Performed at [facility], [address], [place], [place] [address]   MRSA PCR  Screening     Status: None   Collection Time: 12/05/19  9:52 PM   Specimen: Nasopharyngeal  Result Value Ref Range Status   MRSA by PCR NEGATIVE NEGATIVE Final    Comment:        The GeneXpert MRSA Assay (FDA approved for NASAL specimens only), is one component of a comprehensive MRSA colonization surveillance program. It is not intended to diagnose MRSA infection nor to guide or monitor treatment for MRSA infections. Performed at [facility], [place], [place] [address]   SARS CORONAVIRUS 2 (TAT 6-24 HRS) Nasopharyngeal Nasopharyngeal Swab     Status: None   Collection Time: 12/06/19  7:48 PM   Specimen: Nasopharyngeal Swab  Result Value Ref Range Status   SARS Coronavirus 2 NEGATIVE NEGATIVE Final    Comment: (NOTE) SARS-CoV-2 target nucleic acids are NOT DETECTED. The SARS-CoV-2 RNA is generally detectable in upper and lower respiratory specimens during the acute phase of infection. Negative results do not preclude SARS-CoV-2 infection, do not rule out co-infections with other pathogens, and should not be used as the sole  basis for treatment or other patient management decisions. Negative results must be combined with clinical observations, patient history, and epidemiological information. The expected result is Negative. Fact Sheet for Patients: SugarRoll.be Fact Sheet for Healthcare Providers: https://www.woods-mathews.com/ This test is not yet approved or cleared by the [place] FDA and  has been authorized for detection and/or diagnosis of SARS-CoV-2 by FDA under an Emergency Use Authorization (EUA). This EUA will remain  in effect (meaning this test can be used) for the duration of the COVID-19 declaration under Section 56 4(b)(1) of the Act, 21 U.S.C. section 360bbb-3(b)(1), unless the authorization is terminated or revoked sooner. Performed at [facility], [place] [address] [place], [place] [address]   Respiratory Panel by RT PCR (Flu A&B, Covid) - Nasopharyngeal Swab     Status: None   Collection Time: 12/07/19  3:30 AM   Specimen: Nasopharyngeal Swab  Result Value Ref Range Status   SARS Coronavirus 2 by RT PCR NEGATIVE NEGATIVE Final    Comment: (NOTE) SARS-CoV-2 target nucleic acids are NOT DETECTED. The SARS-CoV-2 RNA is generally detectable in upper respiratoy specimens during the acute phase of infection. The lowest concentration of SARS-CoV-2 viral copies this assay can detect is 131 copies/mL. A negative result does not preclude SARS-Cov-2 infection and should not be used as the sole basis for treatment or other patient management decisions. A negative result may occur with  improper specimen collection/handling, submission of specimen other than nasopharyngeal swab, presence of viral mutation(s) within the areas targeted by this assay, and inadequate number of viral copies (<131 copies/mL). A negative result must be combined with clinical observations, patient history, and epidemiological information. The expected result is  Negative. Fact Sheet for Patients:  PinkCheek.be Fact Sheet for Healthcare Providers:  GravelBags.it This test is not yet ap proved or cleared by the [place] FDA and  has been authorized for detection and/or diagnosis of SARS-CoV-2 by FDA under an Emergency Use Authorization (EUA). This EUA will remain  in effect (meaning this test can be used) for the duration of the COVID-19 declaration under Section 564(b)(1) of the Act, 21 U.S.C. section 360bbb-3(b)(1), unless the authorization is terminated or revoked sooner.    Influenza A by PCR NEGATIVE NEGATIVE Final   Influenza B by PCR NEGATIVE NEGATIVE Final    Comment: (NOTE) The Xpert Xpress SARS-CoV-2/FLU/RSV assay is intended as an  aid in  the diagnosis of influenza from Nasopharyngeal swab specimens and  should not be used as a sole basis for treatment. Nasal washings and  aspirates are unacceptable for Xpert Xpress SARS-CoV-2/FLU/RSV  testing. Fact Sheet for Patients: PinkCheek.be Fact Sheet for Healthcare Providers: GravelBags.it This test is not yet approved or cleared by the [place] FDA and  has been authorized for detection and/or diagnosis of SARS-CoV-2 by  FDA under an Emergency Use Authorization (EUA). This EUA will remain  in effect (meaning this test can be used) for the duration of the  Covid-19 declaration under Section 564(b)(1) of the Act, 21  U.S.C. section 360bbb-3(b)(1), unless the authorization is  terminated or revoked. Performed at [facility], [address] [place], [place] [address]      Radiology Studies: MR BRAIN WO CONTRAST  Result Date: 12/06/2019 CLINICAL DATA:  Head trauma.  Delayed recovery. EXAM: MRI HEAD WITHOUT CONTRAST TECHNIQUE: Multiplanar, multiecho pulse sequences of the brain and surrounding structures were obtained without intravenous contrast.  COMPARISON:  CT studies 12/13/2019. MRI 08/21/2010. Cervical spine CT 08/21/2010 FINDINGS: Brain: Diffusion imaging shows 3 definite punctate acute infarctions in the right parietal vertex. There is question of a punctate acute infarction at the left parietal vertex, but this is not certain. Findings are consistent with micro embolic disease. If bilateral, this could be from the heart or ascending aorta. If unilateral, this could be from the right carotid bifurcation. Elsewhere, the brain shows mild age related volume loss. The brain does not show a widespread pattern of chronic small vessel disease. No large vessel territory infarction. No mass lesion, hemorrhage, hydrocephalus or extra-axial collection. Vascular: Major vessels at the base of the brain show flow. Skull and upper cervical spine: Skull base appears negative. There is an abnormal appearance of the dens. The patient had a dens fracture in 2011 and this may be nonunited. Sinuses/Orbits: Clear/normal Other: None IMPRESSION: Three punctate acute infarctions at the right parietal vertex. Question of a single punctate infarction at the left parietal vertex, not definite. If unilateral, this could be embolic disease from the heart, ascending aorta or right carotid system. If bilateral, this could be from the heart or ascending aorta. Otherwise normal appearance of the brain for age. Abnormal appearance of the dens. 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 Cannot rule out repeat fracture in the setting of recent injury. Electronically Signed   By: Nelson Chimes M.D.   On: 12/06/2019 21:26   DG Chest Port 1 View  Result Date: 12/07/2019 CLINICAL DATA:  Hypoxia EXAM: PORTABLE CHEST 1 VIEW COMPARISON:  CTA chest 12/26/2019 chest radiograph 12/12/2019 FINDINGS: Bilateral mixed interstitial and airspace opacities throughout both lungs appear to have acutely worsened since the comparison imaging lung volumes are also  somewhat diminished suggesting some of this may be due to increasing atelectatic change. Much of the cardiomediastinal silhouette is obscured by overlying opacity. Sternotomy wires are intact and aligned with few surgical clips projecting over the mediastinum. Telemetry leads overlie the chest. Vascular calcium noted in the base of the neck. No acute osseous or soft tissue abnormality. IMPRESSION: 1. Bilateral mixed interstitial and airspace opacities appear to have acutely worsened since the comparison imaging. May reflect worsening infectious consolidation, edema, and/or atelectasis given diminishing volumes. Electronically Signed   By: Lovena Le M.D.   On: 12/07/2019 02:32    Scheduled Meds: . aspirin EC  81 mg Oral Daily  . atorvastatin  80 mg Oral QPM  . budesonide (PULMICORT) nebulizer solution  0.5 mg Nebulization BID  . Chlorhexidine Gluconate Cloth  6 each Topical Daily  . cholecalciferol  1,000 Units Oral Daily  . docusate sodium  200 mg Oral Daily  . Glucerna  237 mL Oral AC breakfast  . influenza vaccine adjuvanted  0.5 mL Intramuscular Tomorrow-1000  . insulin aspart  0-20 Units Subcutaneous TID WC  . insulin aspart  0-5 Units Subcutaneous QHS  . insulin glargine  40 Units Subcutaneous QHS  . ipratropium-albuterol  3 mL Nebulization Q4H  . levothyroxine  50 mcg Oral QAC breakfast  . lidocaine  1 patch Transdermal Q24H  . LORazepam  2 mg Intravenous Once  . methadone  10 mg Oral BID  . methylPREDNISolone (SOLU-MEDROL) injection  40 mg Intravenous Q12H  . metoprolol tartrate  100 mg Oral BID  . prazosin  6 mg Oral QHS  . pregabalin  75 mg Oral BID  . QUEtiapine  50 mg Oral QHS  . rivaroxaban  20 mg Oral Q supper  . senna  2 tablet Oral QHS  . sertraline  100 mg Oral Daily  . sodium chloride flush  3 mL Intravenous Q12H   Continuous Infusions: . sodium chloride    . ceFEPime (MAXIPIME) IV    . doxycycline (VIBRAMYCIN) IV 100 mg (12/07/19 0848)     LOS: 3 days   Time  spent: 45 minutes  Lorella Nimrod, MD [facility] Pager [phone]  If 7PM-7AM, please contact night-coverage [email or web address] Password [facility] 12/07/2019, 2:34 PM   This record has been created using Dragon voice recognition software. Errors have been sought and corrected,but may not always be located. Such creation errors do not reflect on the standard of care.

## 2019-12-07 NOTE — Consult Note (Signed)
[facility] for Electrolyte Monitoring and Replacement   Recent Labs: Potassium (mmol/L)  Date Value  12/07/2019 3.5  10/26/2013 4.3   Magnesium (mg/dL)  Date Value  12/05/2019 1.7  10/25/2013 2.4   Calcium (mg/dL)  Date Value  12/07/2019 8.4 (L)   Calcium, Total (mg/dL)  Date Value  10/26/2013 7.9 (L)   Albumin (g/dL)  Date Value  12/07/2019 3.0 (L)  10/05/2018 4.3  10/24/2013 3.6   Phosphorus (mg/dL)  Date Value  01/18/2009 4.2   Sodium (mmol/L)  Date Value  12/07/2019 134 (L)  10/05/2018 141  10/26/2013 140   Corrected Ca: 9.2 mg/dL Add-On magnesium (above level from 1/6): 2.2 mg/dL  Assessment: 73 y.o.malewith a known history of CAD status post CABG, history of atrial flutter/relation status post ablation 2010, hypertension, gait instability with peripheral neuropathy, depression and chronic CHF diastolic   Goal of Therapy (given cardiac history):  Potassium 4.0 - 5.1 mmol/L Magnesium 2.0 - 2.4 mg/dL All Other Electrolytes WNL  Plan:   Replace potassium with 40 mEq oral KCl x 1  BMP, magnesium in am  Dallie Piles ,PharmD Clinical Pharmacist 12/07/2019 9:22 AM

## 2019-12-07 NOTE — Progress Notes (Signed)
Pharmacy Antibiotic Note  Michael Lucas is a 74 y.o. male admitted on 12/03/2019 with pneumonia. Pharmacy was originally consulted for vancomycin and cefepime dosing but vancomycin has been discontinued due to having a negative MRSA PCR in this clinical setting. Renal function has worsened slightly since admission, leukocytosis is worsening  Plan: Change cefepime dose to 2gm IV q12hrs  Height: 5\' 8"  (172.7 cm) Weight: 202 lb 13.2 oz (92 kg) IBW/kg (Calculated) : 68.4  Temp (24hrs), Avg:98.7 F (37.1 C), Min:98.6 F (37 C), Max:98.8 F (37.1 C)  Recent Labs  Lab 12/26/2019 1252 12/05/19 0313 12/06/19 0256 12/07/19 0408  WBC 10.7* 11.6* 12.6* 15.2*  CREATININE 1.24 1.29* 1.35* 1.44*  LATICACIDVEN 1.2  --   --   --     Estimated Creatinine Clearance: 49.5 mL/min (A) (by C-G formula based on SCr of 1.44 mg/dL (H)).    No Known Allergies  Antimicrobials this admission: Zithromax 1/6 >> 1/8 Rocephin x 1 on  1/8 Vancomycin 1/8 x1 Doxycycline 1/8>> Cefepime 1/8 >>  Microbiology results: 1/5 BCx: NGTD 1/8 SARS CoV-2: negative   1/6 MRSA PCR: negative  Thank you for allowing pharmacy to be a part of this patient's care.  Dallie Piles 12/07/2019 7:32 AM

## 2019-12-07 NOTE — Progress Notes (Signed)
Assisted tele visit to patient with family member.  Ambry Dix Anderson, RN   

## 2019-12-08 ENCOUNTER — Encounter: Payer: Self-pay | Admitting: Internal Medicine

## 2019-12-08 DIAGNOSIS — J849 Interstitial pulmonary disease, unspecified: Secondary | ICD-10-CM

## 2019-12-08 LAB — MAGNESIUM
Magnesium: 2 mg/dL (ref 1.7–2.4)
Magnesium: 2 mg/dL (ref 1.7–2.4)

## 2019-12-08 LAB — GLUCOSE, CAPILLARY
Glucose-Capillary: 247 mg/dL — ABNORMAL HIGH (ref 70–99)
Glucose-Capillary: 206 mg/dL — ABNORMAL HIGH (ref 70–99)
Glucose-Capillary: 215 mg/dL — ABNORMAL HIGH (ref 70–99)
Glucose-Capillary: 262 mg/dL — ABNORMAL HIGH (ref 70–99)

## 2019-12-08 LAB — BASIC METABOLIC PANEL
Anion gap: 10 (ref 5–15)
Anion gap: 12 (ref 5–15)
BUN: 35 mg/dL — ABNORMAL HIGH (ref 8–23)
BUN: 31 mg/dL — ABNORMAL HIGH (ref 8–23)
CO2: 23 mmol/L (ref 22–32)
CO2: 22 mmol/L (ref 22–32)
Calcium: 8.4 mg/dL — ABNORMAL LOW (ref 8.9–10.3)
Calcium: 8.6 mg/dL — ABNORMAL LOW (ref 8.9–10.3)
Chloride: 101 mmol/L (ref 98–111)
Chloride: 101 mmol/L (ref 98–111)
Creatinine, Ser: 1.32 mg/dL — ABNORMAL HIGH (ref 0.61–1.24)
Creatinine, Ser: 1.19 mg/dL (ref 0.61–1.24)
GFR calc Af Amer: >60 mL/min (ref >60)
GFR calc Af Amer: >60 mL/min (ref >60)
GFR calc non Af Amer: 53 mL/min — ABNORMAL LOW (ref >60)
GFR calc non Af Amer: 60 mL/min — ABNORMAL LOW (ref >60)
Glucose, Bld: 271 mg/dL — ABNORMAL HIGH (ref 70–99)
Glucose, Bld: 245 mg/dL — ABNORMAL HIGH (ref 70–99)
Potassium: 4 mmol/L (ref 3.5–5.1)
Potassium: 4.2 mmol/L (ref 3.5–5.1)
Sodium: 134 mmol/L — ABNORMAL LOW (ref 135–145)
Sodium: 135 mmol/L (ref 135–145)

## 2019-12-08 LAB — TROPONIN I (HIGH SENSITIVITY)
Troponin I (High Sensitivity): 679 ng/L (ref <18)
Troponin I (High Sensitivity): 1272 ng/L (ref <18)

## 2019-12-08 MED ORDER — METOPROLOL TARTRATE 5 MG/5ML IV SOLN: 5.0000 mg | INTRAVENOUS | Status: AC

## 2019-12-08 MED ORDER — FUROSEMIDE 10 MG/ML IJ SOLN
40.0000 mg | INTRAMUSCULAR | Status: AC
  Administered 2019-12-08: 40 mg via INTRAVENOUS

## 2019-12-08 MED ORDER — DIPHENHYDRAMINE HCL 25 MG PO CAPS
25.0000 mg | ORAL_CAPSULE | Freq: Once | ORAL | Status: AC
  Administered 2019-12-08: 25 mg via ORAL
  Filled 2019-12-08 (×2): qty 1

## 2019-12-08 MED ORDER — DILTIAZEM HCL 25 MG/5ML IV SOLN
10.0000 mg | Freq: Once | INTRAVENOUS | Status: AC
  Administered 2019-12-08: 10 mg via INTRAVENOUS
  Filled 2019-12-08: qty 5

## 2019-12-08 MED ORDER — METOPROLOL TARTRATE 5 MG/5ML IV SOLN
5.0000 mg | INTRAVENOUS | Status: AC
  Administered 2019-12-08: 5 mg via INTRAVENOUS

## 2019-12-08 MED ORDER — FUROSEMIDE 10 MG/ML IJ SOLN
INTRAMUSCULAR | Status: AC
  Filled 2019-12-08: qty 4

## 2019-12-08 MED ORDER — METOPROLOL TARTRATE 5 MG/5ML IV SOLN
INTRAVENOUS | Status: AC
  Administered 2019-12-09: 5 mg
  Filled 2019-12-08: qty 5

## 2019-12-08 MED ORDER — METOPROLOL TARTRATE 5 MG/5ML IV SOLN
5.0000 mg | Freq: Once | INTRAVENOUS | Status: AC
  Administered 2019-12-08: 5 mg via INTRAVENOUS
  Filled 2019-12-08: qty 5

## 2019-12-08 MED ORDER — METOPROLOL TARTRATE 5 MG/5ML IV SOLN
INTRAVENOUS | Status: AC
  Filled 2019-12-08: qty 5

## 2019-12-08 NOTE — Progress Notes (Signed)
Assisted tele visit to patient with family member.  Rosie Torrez P, RN  

## 2019-12-08 NOTE — Progress Notes (Signed)
Pharmacy Antibiotic Note  Michael Lucas is a 74 y.o. male admitted on 12/03/2019 with pneumonia. Pharmacy was originally consulted for vancomycin and cefepime dosing but vancomycin has been discontinued due to having a negative MRSA PCR in this clinical setting. Renal function starting to improve.  Plan: Continue cefepime 2gm IV q12hrs  Height: 5\' 8"  (172.7 cm) Weight: 202 lb 13.2 oz (92 kg) IBW/kg (Calculated) : 68.4  Temp (24hrs), Avg:98 F (36.7 C), Min:97.8 F (36.6 C), Max:98.2 F (36.8 C)  Recent Labs  Lab 12/12/2019 1252 12/05/19 0313 12/06/19 0256 12/07/19 0408 12/08/19 0503  WBC 10.7* 11.6* 12.6* 15.2*  --   CREATININE 1.24 1.29* 1.35* 1.44* 1.32*  LATICACIDVEN 1.2  --   --   --   --     Estimated Creatinine Clearance: 54 mL/min (A) (by C-G formula based on SCr of 1.32 mg/dL (H)).    No Known Allergies  Antimicrobials this admission: Zithromax 1/6 >> 1/8 Rocephin x 1 on  1/8 Vancomycin 1/8 x1 Doxycycline 1/8>> Cefepime 1/8 >>  Microbiology results: 1/5 BCx: NGTD 1/8 SARS CoV-2: negative   1/6 MRSA PCR: negative  Thank you for allowing pharmacy to be a part of this patient's care.  Tawnya Crook, PharmD 12/08/2019 8:27 AM

## 2019-12-08 NOTE — Progress Notes (Signed)
Pt experiencing more frequent runs of A Fib RVR w/ HR >150 throughout shift, while remaining hemodynamically stable. Attending notified, order given for 1x IV metoprolol push. Pt had therapeutic response with HR control being achieved. Pt went into second episode of refractory A. Fib at 0400 with accompanying desaturation event after he removed Hiflow nasal cannula. Bipap was placed and attending was notified, order placed for 1x IV cardizem push. Pt HR now controlled in low 100s, VSS, resting calmly on Bipap. Will continue to monitor.

## 2019-12-08 NOTE — Progress Notes (Signed)
CRITICAL CARE NOTE   BRIEF PATIENT DESCRIPTION:  74 year old male admitted 12/21/2019 with acute hypoxic respiratory failure felt to be attributed to acute decompensated HFpEF and atrial fibrillation with RVR.  CT scan with patchy groundglass infiltrates concerning for pulmonary edema versus atypical/viral pneumonia, but initial COVID-19 PCR is negative.  Cardiology was consulted, but despite aggressive diuresis he continues to have increased FiO2 requirements and rapid desaturations off oxygen.    SIGNIFICANT EVENTS  1/5-admission the MedSurg unit 1/5-fell and hit head; CTH post fall is negative 1/6- Dyspneic, hypoxic, with increasing FiO2 requirments, transfer to stepdown 1/8-persistent hypoxia despite aggressive diuresis, PCCM consulted  STUDIES:  1/5-CTA chest>>No evidence of pulmonary emboli. Patchy ground-glass infiltrates with interstitial thickening likely related to patchy edema. Patient has a negative COVID-19 test. Atypical pneumonia may also be superimposed as well. Mediastinal and hilar adenopathy likely reactive given the appearance of the lungs. Aortic Atherosclerosis 1/5-CT head without contrast>>Chronic atrophic and ischemic changes without acute abnormality. 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 Question of a single punctate infarction at the left parietal vertex, not definite. If unilateral, this could be embolic disease from the heart, ascending aorta or right carotid system. If bilateral, this could be from the heart or ascending aorta. Otherwise normal appearance of the brain for age. Abnormal appearance of the dens. 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 Cannot rule out repeat fracture in the setting of recent injury. 1/8- CXR>> 1. Bilateral mixed interstitial and airspace opacities appear to have acutely worsened since the comparison imaging. May  reflect worsening infectious consolidation, edema, and/or atelectasis given diminishing volumes.  CULTURES: SARS-CoV-2 Ag 1/5>> negative Influenza PCR 1/5>> negative SARS-CoV-2 PCR 1/5>> negative Blood cultures x2 1/5>> MRSA PCR 1/6>> negative SARS-CoV-2 PCR 1/8>>negative Influenza PCR 1/8>>negative Sputum 1/8>> Strep pneumo urinary antigen 1/8>> Legionella urinary antigen 1/8>>  ANTIBIOTICS: Azithromycin 1/6>>1/8 Doxycycline 1/8>> Cefepime 1/8>> Vancomycin 1/8>>    CC  follow up respiratory failure  SUBJECTIVE Patient remains critically ill Prognosis is guarded High risk for death Patient likely needs intubation    BP 108/66   Pulse (!) 120   Temp 97.9 F (36.6 C) (Axillary)   Resp (!) 24   Ht 5\' 8"  (1.727 m)   Wt 92 kg   SpO2 98%   BMI 30.84 kg/m    I/O last 3 completed shifts: In: 1081.8 [P.O.:390; IV Piggyback:691.8] Out: [id number] [Urine:1875] Total I/O In: 6 [I.V.:6] Out: -   SpO2: 98 % O2 Flow Rate (L/min): 45 L/min FiO2 (%): 70 %   SIGNIFICANT EVENTS   REVIEW OF SYSTEMS  PATIENT IS UNABLE TO PROVIDE COMPLETE REVIEW OF SYSTEMS DUE TO SEVERE CRITICAL ILLNESS   PHYSICAL EXAMINATION:  GENERAL:critically ill appearing, +resp distress HEAD: Normocephalic, atraumatic.  EYES: Pupils equal, round, reactive to light.  No scleral icterus.  MOUTH: Moist mucosal membrane. NECK: Supple.  PULMONARY: +rhonchi, +wheezing CARDIOVASCULAR: S1 and S2. Regular rate and rhythm. No murmurs, rubs, or gallops.  GASTROINTESTINAL: Soft, nontender, -distended.  Positive bowel sounds.   MUSCULOSKELETAL: No swelling, clubbing, or edema.  NEUROLOGIC:alert intermittently lethargic SKIN:intact,warm,dry  MEDICATIONS: I have reviewed all medications and confirmed regimen as documented   CULTURE RESULTS   Recent Results (from the past 240 hour(s))  Blood Culture (routine x 2)     Status: None (Preliminary result)   Collection Time: 12/11/2019 12:52 PM   Specimen:  BLOOD  Result Value Ref Range Status   Specimen Description  BLOOD BLOOD LEFT HAND  Final   Special Requests   Final    BOTTLES DRAWN AEROBIC AND ANAEROBIC Blood Culture results may not be optimal due to an excessive volume of blood received in culture bottles   Culture   Final    NO GROWTH 4 DAYS Performed at [facility], [address], [place], [place] [address]    Report Status PENDING  Incomplete  Blood Culture (routine x 2)     Status: None (Preliminary result)   Collection Time: 12/08/2019 12:57 PM   Specimen: BLOOD  Result Value Ref Range Status   Specimen Description BLOOD LEFT ANTECUBITAL  Final   Special Requests   Final    BOTTLES DRAWN AEROBIC AND ANAEROBIC Blood Culture adequate volume   Culture   Final    NO GROWTH 4 DAYS Performed at [facility], [address], [place], [place] [address]    Report Status PENDING  Incomplete  Respiratory Panel by RT PCR (Flu A&B, Covid) - Nasopharyngeal Swab     Status: None   Collection Time: 12/21/2019  3:45 PM   Specimen: Nasopharyngeal Swab  Result Value Ref Range Status   SARS Coronavirus 2 by RT PCR NEGATIVE NEGATIVE Final    Comment: (NOTE) SARS-CoV-2 target nucleic acids are NOT DETECTED. The SARS-CoV-2 RNA is generally detectable in upper respiratoy specimens during the acute phase of infection. The lowest concentration of SARS-CoV-2 viral copies this assay can detect is 131 copies/mL. A negative result does not preclude SARS-Cov-2 infection and should not be used as the sole basis for treatment or other patient management decisions. A negative result may occur with  improper specimen collection/handling, submission of specimen other than nasopharyngeal swab, presence of viral mutation(s) within the areas targeted by this assay, and inadequate number of viral copies (<131 copies/mL). A negative result must be combined with clinical observations, patient history, and epidemiological information.  The expected result is Negative. Fact Sheet for Patients:  PinkCheek.be Fact Sheet for Healthcare Providers:  GravelBags.it This test is not yet ap proved or cleared by the [place] FDA and  has been authorized for detection and/or diagnosis of SARS-CoV-2 by FDA under an Emergency Use Authorization (EUA). This EUA will remain  in effect (meaning this test can be used) for the duration of the COVID-19 declaration under Section 564(b)(1) of the Act, 21 U.S.C. section 360bbb-3(b)(1), unless the authorization is terminated or revoked sooner.    Influenza A by PCR NEGATIVE NEGATIVE Final   Influenza B by PCR NEGATIVE NEGATIVE Final    Comment: (NOTE) The Xpert Xpress SARS-CoV-2/FLU/RSV assay is intended as an aid in  the diagnosis of influenza from Nasopharyngeal swab specimens and  should not be used as a sole basis for treatment. Nasal washings and  aspirates are unacceptable for Xpert Xpress SARS-CoV-2/FLU/RSV  testing. Fact Sheet for Patients: PinkCheek.be Fact Sheet for Healthcare Providers: GravelBags.it This test is not yet approved or cleared by the [place] FDA and  has been authorized for detection and/or diagnosis of SARS-CoV-2 by  FDA under an Emergency Use Authorization (EUA). This EUA will remain  in effect (meaning this test can be used) for the duration of the  Covid-19 declaration under Section 564(b)(1) of the Act, 21  U.S.C. section 360bbb-3(b)(1), unless the authorization is  terminated or revoked. Performed at [facility], [address] [place], [place] [address]   MRSA PCR Screening     Status: None   Collection Time: 12/05/19  9:52 PM   Specimen: Nasopharyngeal  Result Value Ref Range Status   MRSA by PCR NEGATIVE NEGATIVE Final    Comment:        The GeneXpert MRSA Assay (FDA approved for NASAL specimens only), is one  component of a comprehensive MRSA colonization surveillance program. It is not intended to diagnose MRSA infection nor to guide or monitor treatment for MRSA infections. Performed at [facility], [place], [place] [address]   SARS CORONAVIRUS 2 (TAT 6-24 HRS) Nasopharyngeal Nasopharyngeal Swab     Status: None   Collection Time: 12/06/19  7:48 PM   Specimen: Nasopharyngeal Swab  Result Value Ref Range Status   SARS Coronavirus 2 NEGATIVE NEGATIVE Final    Comment: (NOTE) SARS-CoV-2 target nucleic acids are NOT DETECTED. The SARS-CoV-2 RNA is generally detectable in upper and lower respiratory specimens during the acute phase of infection. Negative results do not preclude SARS-CoV-2 infection, do not rule out co-infections with other pathogens, and should not be used as the sole basis for treatment or other patient management decisions. Negative results must be combined with clinical observations, patient history, and epidemiological information. The expected result is Negative. Fact Sheet for Patients: SugarRoll.be Fact Sheet for Healthcare Providers: https://www.woods-mathews.com/ This test is not yet approved or cleared by the [place] FDA and  has been authorized for detection and/or diagnosis of SARS-CoV-2 by FDA under an Emergency Use Authorization (EUA). This EUA will remain  in effect (meaning this test can be used) for the duration of the COVID-19 declaration under Section 56 4(b)(1) of the Act, 21 U.S.C. section 360bbb-3(b)(1), unless the authorization is terminated or revoked sooner. Performed at [facility], [place] [address] [place], [place] [address]   Respiratory Panel by RT PCR (Flu A&B, Covid) - Nasopharyngeal Swab     Status: None   Collection Time: 12/07/19  3:30 AM   Specimen: Nasopharyngeal Swab  Result Value Ref Range Status   SARS Coronavirus 2 by RT PCR NEGATIVE NEGATIVE Final     Comment: (NOTE) SARS-CoV-2 target nucleic acids are NOT DETECTED. The SARS-CoV-2 RNA is generally detectable in upper respiratoy specimens during the acute phase of infection. The lowest concentration of SARS-CoV-2 viral copies this assay can detect is 131 copies/mL. A negative result does not preclude SARS-Cov-2 infection and should not be used as the sole basis for treatment or other patient management decisions. A negative result may occur with  improper specimen collection/handling, submission of specimen other than nasopharyngeal swab, presence of viral mutation(s) within the areas targeted by this assay, and inadequate number of viral copies (<131 copies/mL). A negative result must be combined with clinical observations, patient history, and epidemiological information. The expected result is Negative. Fact Sheet for Patients:  PinkCheek.be Fact Sheet for Healthcare Providers:  GravelBags.it This test is not yet ap proved or cleared by the [place] FDA and  has been authorized for detection and/or diagnosis of SARS-CoV-2 by FDA under an Emergency Use Authorization (EUA). This EUA will remain  in effect (meaning this test can be used) for the duration of the COVID-19 declaration under Section 564(b)(1) of the Act, 21 U.S.C. section 360bbb-3(b)(1), unless the authorization is terminated or revoked sooner.    Influenza A by PCR NEGATIVE NEGATIVE Final   Influenza B by PCR NEGATIVE NEGATIVE Final    Comment: (NOTE) The Xpert Xpress SARS-CoV-2/FLU/RSV assay is intended as an aid in  the diagnosis of influenza from Nasopharyngeal swab specimens and  should not be used as a sole basis for treatment. Nasal washings and  aspirates are unacceptable for Xpert Xpress SARS-CoV-2/FLU/RSV  testing. Fact Sheet for Patients: PinkCheek.be Fact Sheet for Healthcare  Providers: GravelBags.it This test is not yet approved or cleared by the [place] FDA and  has been authorized for detection and/or diagnosis of SARS-CoV-2 by  FDA under an Emergency Use Authorization (EUA). This EUA will remain  in effect (meaning this test can be used) for the duration of the  Covid-19 declaration under Section 564(b)(1) of the Act, 21  U.S.C. section 360bbb-3(b)(1), unless the authorization is  terminated or revoked. Performed at [facility], [address] [place],  [address]              ASSESSMENT AND PLAN SYNOPSIS   Severe ACUTE Hypoxic and Hypercapnic Respiratory Failure progressive ILD with atypical pneumonia High risk for intubation, cardiac arrest   Underlying fibrosis and ILD Continue IV steroids  ACUTE SYSTOLIC CARDIAC FAILURE- HFpEF -oxygen as needed -Lasix as tolerated  ACUTE KIDNEY INJURY/Renal Failure -follow chem 7 -follow UO -continue Foley Catheter-assess need -Avoid nephrotoxic agents -Recheck creatinine    CARDIAC ICU monitoring  ID-pneumonia -continue IV abx as prescibed -follow up cultures  GI GI PROPHYLAXIS as indicated  NUTRITIONAL STATUS DIET-->as tolerated Constipation protocol as indicated  ENDO - will use ICU hypoglycemic\Hyperglycemia protocol if indicated   ELECTROLYTES -follow labs as needed -replace as needed -pharmacy consultation and following   DVT/GI PRX ordered TRANSFUSIONS AS NEEDED MONITOR FSBS ASSESS the need for LABS as needed   Critical Care Time devoted to patient care services described in this note is 35 minutes.   Overall, patient is critically ill, prognosis is guarded.    Corrin Dahmen, M.D.  [facility]  Medical Director [place] Director [facility] Cardio-Pulmonary Department

## 2019-12-08 NOTE — Consult Note (Signed)
[facility] for Electrolyte Monitoring and Replacement   Recent Labs: Potassium (mmol/L)  Date Value  12/08/2019 4.0  10/26/2013 4.3   Magnesium (mg/dL)  Date Value  12/08/2019 2.0  10/25/2013 2.4   Calcium (mg/dL)  Date Value  12/08/2019 8.4 (L)   Calcium, Total (mg/dL)  Date Value  10/26/2013 7.9 (L)   Albumin (g/dL)  Date Value  12/07/2019 3.0 (L)  10/05/2018 4.3  10/24/2013 3.6   Phosphorus (mg/dL)  Date Value  01/18/2009 4.2   Sodium (mmol/L)  Date Value  12/08/2019 134 (L)  10/05/2018 141  10/26/2013 140   Corrected Ca: 9.2 mg/dL  Assessment: 74 y.o.malewith a known history of CAD status post CABG, history of atrial flutter/relation status post ablation 2010, hypertension, gait instability with peripheral neuropathy, depression and chronic CHF diastolic   Goal of Therapy (given cardiac history):  Potassium 4.0 - 5.1 mmol/L Magnesium 2.0 - 2.4 mg/dL All Other Electrolytes WNL  Plan:  Electrolytes WNL today. Lasix discontinued 1/8. Will defer replacement today and f/u with am labs.  Tawnya Crook ,PharmD Clinical Pharmacist 12/08/2019 7:41 AM

## 2019-12-08 NOTE — Progress Notes (Signed)
Pt has remained alert and oriented x 4 with no c/o pain.  Pt has remained in AFib with RVR at times non-sustaining to the 170s. BP WNL. Pt cardiac monitor began to notify for ST depression-> Kasa made aware-> EKG, trending troponins (first trop 679), BMP with Mag. Pt has titrated between 70-90% FiO2 on HFNC with sats as low as 77-> pt is slow to rebound, and needs encouragement of proper breathing techniques- pt is a mouth breather. Pt has had very little meal substance, but has tolerated fluids well. Pt is very pleasant and daughter has remained at bedside for support. She has been updated by Mortimer Fries, MD and myself.

## 2019-12-09 ENCOUNTER — Inpatient Hospital Stay: Payer: Self-pay

## 2019-12-09 ENCOUNTER — Inpatient Hospital Stay: Payer: Medicare Other

## 2019-12-09 LAB — CULTURE, BLOOD (ROUTINE X 2)
Culture: NO GROWTH
Culture: NO GROWTH
Special Requests: ADEQUATE

## 2019-12-09 LAB — BASIC METABOLIC PANEL
Anion gap: 15 (ref 5–15)
BUN: 34 mg/dL — ABNORMAL HIGH (ref 8–23)
CO2: 21 mmol/L — ABNORMAL LOW (ref 22–32)
Calcium: 8.8 mg/dL — ABNORMAL LOW (ref 8.9–10.3)
Chloride: 97 mmol/L — ABNORMAL LOW (ref 98–111)
Creatinine, Ser: 1.38 mg/dL — ABNORMAL HIGH (ref 0.61–1.24)
GFR calc Af Amer: 58 mL/min — ABNORMAL LOW (ref >60)
GFR calc non Af Amer: 50 mL/min — ABNORMAL LOW (ref >60)
Glucose, Bld: 282 mg/dL — ABNORMAL HIGH (ref 70–99)
Potassium: 4.4 mmol/L (ref 3.5–5.1)
Sodium: 133 mmol/L — ABNORMAL LOW (ref 135–145)

## 2019-12-09 LAB — BLOOD GAS, ARTERIAL
Acid-base deficit: 0.4 mmol/L (ref 0.0–2.0)
Bicarbonate: 22.9 mmol/L (ref 20.0–28.0)
FIO2: 0.98
O2 Saturation: 92.5 %
Patient temperature: 37
pCO2 arterial: 33 mmHg (ref 32.0–48.0)
pH, Arterial: 7.45 (ref 7.350–7.450)
pO2, Arterial: 62 mmHg — ABNORMAL LOW (ref 83.0–108.0)

## 2019-12-09 LAB — GLUCOSE, CAPILLARY: Glucose-Capillary: 263 mg/dL — ABNORMAL HIGH (ref 70–99)

## 2019-12-09 LAB — BRAIN NATRIURETIC PEPTIDE: B Natriuretic Peptide: 538 pg/mL — ABNORMAL HIGH (ref 0.0–100.0)

## 2019-12-09 LAB — CBC WITH DIFFERENTIAL/PLATELET
Abs Immature Granulocytes: 0.12 10*3/uL — ABNORMAL HIGH (ref 0.00–0.07)
Basophils Absolute: 0 10*3/uL (ref 0.0–0.1)
Basophils Relative: 0 %
Eosinophils Absolute: 0 10*3/uL (ref 0.0–0.5)
Eosinophils Relative: 0 %
HCT: 36.1 % — ABNORMAL LOW (ref 39.0–52.0)
Hemoglobin: 11.6 g/dL — ABNORMAL LOW (ref 13.0–17.0)
Immature Granulocytes: 1 %
Lymphocytes Relative: 5 %
Lymphs Abs: 0.8 10*3/uL (ref 0.7–4.0)
MCH: 27.9 pg (ref 26.0–34.0)
MCHC: 32.1 g/dL (ref 30.0–36.0)
MCV: 86.8 fL (ref 80.0–100.0)
Monocytes Absolute: 1.1 10*3/uL — ABNORMAL HIGH (ref 0.1–1.0)
Monocytes Relative: 6 %
Neutro Abs: 15.5 10*3/uL — ABNORMAL HIGH (ref 1.7–7.7)
Neutrophils Relative %: 88 %
Platelets: 282 10*3/uL (ref 150–400)
RBC: 4.16 MIL/uL — ABNORMAL LOW (ref 4.22–5.81)
RDW: 13.5 % (ref 11.5–15.5)
WBC: 17.6 10*3/uL — ABNORMAL HIGH (ref 4.0–10.5)
nRBC: 0 % (ref 0.0–0.2)

## 2019-12-09 LAB — TROPONIN I (HIGH SENSITIVITY)
Troponin I (High Sensitivity): 2994 ng/L (ref <18)
Troponin I (High Sensitivity): 2935 ng/L (ref <18)

## 2019-12-09 MED ORDER — DEXMEDETOMIDINE HCL IN NACL 400 MCG/100ML IV SOLN
0.4000 ug/kg/h | INTRAVENOUS | Status: DC
  Administered 2019-12-09: 0.4 ug/kg/h via INTRAVENOUS
  Filled 2019-12-09 (×2): qty 100

## 2019-12-09 MED ORDER — MORPHINE BOLUS VIA INFUSION
5.0000 mg | INTRAVENOUS | Status: DC | PRN
  Filled 2019-12-09: qty 5

## 2019-12-09 MED ORDER — MORPHINE SULFATE (PF) 2 MG/ML IV SOLN: 2.0000 mg | INTRAVENOUS | Status: DC | PRN

## 2019-12-09 MED ORDER — METOPROLOL TARTRATE 5 MG/5ML IV SOLN
5.0000 mg | INTRAVENOUS | Status: AC
  Administered 2019-12-09: 5 mg via INTRAVENOUS

## 2019-12-09 MED ORDER — AMIODARONE IV BOLUS ONLY 150 MG/100ML
INTRAVENOUS | Status: AC
  Administered 2019-12-09: 150 mg via INTRAVENOUS
  Filled 2019-12-09: qty 100

## 2019-12-09 MED ORDER — LORAZEPAM 2 MG/ML IJ SOLN
INTRAMUSCULAR | Status: AC
  Administered 2019-12-09: 2 mg via INTRAVENOUS
  Filled 2019-12-09: qty 1

## 2019-12-09 MED ORDER — GLYCOPYRROLATE 0.2 MG/ML IJ SOLN: 0.2000 mg | INTRAMUSCULAR | Status: DC | PRN

## 2019-12-09 MED ORDER — LORAZEPAM 2 MG/ML IJ SOLN: 2.0000 mg | Freq: Once | INTRAMUSCULAR | Status: AC

## 2019-12-09 MED ORDER — POLYVINYL ALCOHOL 1.4 % OP SOLN
1.0000 [drp] | Freq: Four times a day (QID) | OPHTHALMIC | Status: DC | PRN
  Filled 2019-12-09: qty 15

## 2019-12-09 MED ORDER — ACETAMINOPHEN 650 MG RE SUPP: 650.0000 mg | Freq: Four times a day (QID) | RECTAL | Status: DC | PRN

## 2019-12-09 MED ORDER — DEXTROSE 5 % IV SOLN: INTRAVENOUS | Status: DC

## 2019-12-09 MED ORDER — AMIODARONE HCL IN DEXTROSE 360-4.14 MG/200ML-% IV SOLN
30.0000 mg/h | INTRAVENOUS | Status: DC
  Administered 2019-12-09: 30 mg/h via INTRAVENOUS

## 2019-12-09 MED ORDER — MORPHINE 100MG IN NS 100ML (1MG/ML) PREMIX INFUSION
0.0000 mg/h | INTRAVENOUS | Status: DC
  Administered 2019-12-09: 3 mg/h via INTRAVENOUS
  Filled 2019-12-09: qty 100

## 2019-12-09 MED ORDER — METOPROLOL TARTRATE 5 MG/5ML IV SOLN
5.0000 mg | Freq: Four times a day (QID) | INTRAVENOUS | Status: DC
  Filled 2019-12-09: qty 5

## 2019-12-09 MED ORDER — MIDAZOLAM HCL 2 MG/2ML IJ SOLN: 2.0000 mg | INTRAMUSCULAR | Status: DC | PRN

## 2019-12-09 MED ORDER — MIDAZOLAM HCL 2 MG/2ML IJ SOLN
INTRAMUSCULAR | Status: AC
  Administered 2019-12-09: 2 mg via INTRAVENOUS
  Filled 2019-12-09: qty 2

## 2019-12-09 MED ORDER — DILTIAZEM HCL 25 MG/5ML IV SOLN: 10.0000 mg | INTRAVENOUS | Status: AC

## 2019-12-09 MED ORDER — AMIODARONE HCL IN DEXTROSE 360-4.14 MG/200ML-% IV SOLN
60.0000 mg/h | INTRAVENOUS | Status: AC
  Administered 2019-12-09: 60 mg/h via INTRAVENOUS
  Filled 2019-12-09 (×2): qty 200

## 2019-12-09 MED ORDER — TRAZODONE HCL 50 MG PO TABS: 50.0000 mg | ORAL_TABLET | Freq: Every evening | ORAL | Status: DC | PRN

## 2019-12-09 MED ORDER — MORPHINE SULFATE (PF) 2 MG/ML IV SOLN
1.0000 mg | Freq: Once | INTRAVENOUS | Status: AC
  Administered 2019-12-09: 02:00:00 1 mg via INTRAVENOUS
  Filled 2019-12-09: qty 1

## 2019-12-09 MED ORDER — AMIODARONE IV BOLUS ONLY 150 MG/100ML: 150.0000 mg | Freq: Once | INTRAVENOUS | Status: AC

## 2019-12-09 MED ORDER — MORPHINE SULFATE (PF) 2 MG/ML IV SOLN
1.0000 mg | INTRAVENOUS | Status: DC | PRN
  Administered 2019-12-09: 1 mg via INTRAVENOUS
  Filled 2019-12-09: qty 1

## 2019-12-09 MED ORDER — DILTIAZEM HCL-DEXTROSE 125-5 MG/125ML-% IV SOLN (PREMIX)
5.0000 mg/h | INTRAVENOUS | Status: DC
  Administered 2019-12-09: 5 mg/h via INTRAVENOUS
  Filled 2019-12-09: qty 125

## 2019-12-09 MED ORDER — GLYCOPYRROLATE 1 MG PO TABS
1.0000 mg | ORAL_TABLET | ORAL | Status: DC | PRN
  Filled 2019-12-09: qty 1

## 2019-12-09 MED ORDER — DILTIAZEM HCL 25 MG/5ML IV SOLN
INTRAVENOUS | Status: AC
  Administered 2019-12-09: 10 mg
  Filled 2019-12-09: qty 5

## 2019-12-09 MED ORDER — ACETAMINOPHEN 325 MG PO TABS: 650.0000 mg | ORAL_TABLET | Freq: Four times a day (QID) | ORAL | Status: DC | PRN

## 2019-12-09 MED ORDER — MIDAZOLAM HCL 2 MG/2ML IJ SOLN: 2.0000 mg | Freq: Once | INTRAMUSCULAR | Status: AC

## 2019-12-09 MED ORDER — METOPROLOL TARTRATE 5 MG/5ML IV SOLN
INTRAVENOUS | Status: AC
  Administered 2019-12-09: 5 mg
  Filled 2019-12-09: qty 5

## 2019-12-09 MED ORDER — DIPHENHYDRAMINE HCL 50 MG/ML IJ SOLN: 25.0000 mg | INTRAMUSCULAR | Status: DC | PRN

## 2019-12-11 ENCOUNTER — Ambulatory Visit: Payer: Medicare Other | Admitting: Family

## 2019-12-14 LAB — BLOOD GAS, VENOUS
Acid-base deficit: 0.4 mmol/L (ref 0.0–2.0)
Bicarbonate: 24.8 mmol/L (ref 20.0–28.0)
O2 Saturation: 25.6 %
Patient temperature: 37
pCO2, Ven: 42 mmHg — ABNORMAL LOW (ref 44.0–60.0)
pH, Ven: 7.38 (ref 7.250–7.430)

## 2019-12-19 ENCOUNTER — Ambulatory Visit: Payer: Self-pay | Admitting: Family Medicine

## 2019-12-31 NOTE — Consult Note (Signed)
[facility] for Electrolyte Monitoring and Replacement   Recent Labs: Potassium (mmol/L)  Date Value  December 30, 2019 4.4  10/26/2013 4.3   Magnesium (mg/dL)  Date Value  12/08/2019 2.0  10/25/2013 2.4   Calcium (mg/dL)  Date Value  Dec 30, 2019 8.8 (L)   Calcium, Total (mg/dL)  Date Value  10/26/2013 7.9 (L)   Albumin (g/dL)  Date Value  12/07/2019 3.0 (L)  10/05/2018 4.3  10/24/2013 3.6   Phosphorus (mg/dL)  Date Value  01/18/2009 4.2   Sodium (mmol/L)  Date Value  December 30, 2019 133 (L)  10/05/2018 141  10/26/2013 140   Corrected Ca: 9.6 mg/dL  Assessment: 74 y.o.malewith a known history of CAD status post CABG, history of atrial flutter/relation status post ablation 2010, hypertension, gait instability with peripheral neuropathy, depression and chronic CHF diastolic   Goal of Therapy (given cardiac history):  Potassium 4.0 - 5.1 mmol/L Magnesium 2.0 - 2.4 mg/dL All Other Electrolytes WNL  Plan:  Electrolytes WNL today. Lasix discontinued 1/8. Will defer replacement today and f/u with am labs.  Tawnya Crook ,PharmD Clinical Pharmacist 2019-12-30 7:31 AM

## 2019-12-31 NOTE — Progress Notes (Signed)
Time of death 1806. Family at bedside. CDS notified and released. Pt eligible for skin and eye donation-eye prep complete.

## 2019-12-31 NOTE — Progress Notes (Signed)
Pharmacy Antibiotic Note  Michael Lucas is a 73 y.o. male admitted on 12/18/2019 with pneumonia. Pharmacy was originally consulted for vancomycin and cefepime dosing but vancomycin has been discontinued due to having a negative MRSA PCR in this clinical setting. Renal function starting to improve.  Plan: Continue cefepime 2gm IV q12hrs  Height: 5\' 8"  (172.7 cm) Weight: 205 lb 7.5 oz (93.2 kg) IBW/kg (Calculated) : 68.4  Temp (24hrs), Avg:98.1 F (36.7 C), Min:97.9 F (36.6 C), Max:98.3 F (36.8 C)  Recent Labs  Lab 12/06/2019 1252 12/05/19 0313 12/06/19 0256 12/07/19 0408 12/08/19 0503 12/08/19 1554 2019/12/20 0231  WBC 10.7* 11.6* 12.6* 15.2*  --   --  17.6*  CREATININE 1.24 1.29* 1.35* 1.44* 1.32* 1.19 1.38*  LATICACIDVEN 1.2  --   --   --   --   --   --     Estimated Creatinine Clearance: 52 mL/min (A) (by C-G formula based on SCr of 1.38 mg/dL (H)).    No Known Allergies  Antimicrobials this admission: Zithromax 1/6 >> 1/8 Rocephin x 1 on  1/8 Vancomycin 1/8 x1 Doxycycline 1/8>> Cefepime 1/8 >>  Microbiology results: 1/5 BCx: NGTD 1/8 SARS CoV-2: negative   1/6 MRSA PCR: negative  Thank you for allowing pharmacy to be a part of this patient's care.  Tawnya Crook, PharmD 2019-12-20 7:33 AM

## 2019-12-31 NOTE — Progress Notes (Signed)
CRITICAL CARE NOTE  BRIEF PATIENT DESCRIPTION: 74 year old male admitted 12/26/2019 with acute hypoxic respiratory failure felt to be attributed to acute decompensatedHFpEFand atrial fibrillation with RVR.CT scan with patchy groundglass infiltrates concerning for pulmonary edema versus atypical/viral pneumonia,but initial COVID-19 PCR is negative. Cardiology was consulted,butdespite aggressive diuresis he continues to have increased FiO2 requirements and rapid desaturations off oxygen.  SIGNIFICANT EVENTS 1/5-admission the MedSurg unit 1/5-[place] hit head; CTH post fall is negative 1/6- Dyspneic, hypoxic, with increasing FiO2 requirments,transfer to stepdown 1/8-persistent hypoxia despite aggressive diuresis, PCCM consulted 1/9 severe resp failure, placed on biPAP, placed on amiodarone, cardizem and precedex, patient dying process    STUDIES: 1/5-CTA chest>>No evidence of pulmonary emboli. Patchy ground-glass infiltrates with interstitial thickening likely related to patchy edema. Patient has a negative COVID-19 test. Atypical pneumonia may also be superimposed as well. Mediastinal and hilar adenopathy likely reactive given the appearance of the lungs. Aortic Atherosclerosis 1/5-CT head without contrast>>Chronic atrophic and ischemic changes without acute abnormality. 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 Question of a single punctate infarction at the left parietal vertex, not definite. If unilateral, this could be embolic disease from the heart, ascending aorta or right carotid system. If bilateral, this could be from the heart or ascending aorta. Otherwise normal appearance of the brain for age. Abnormal appearance of the dens. 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 Cannot rule out repeat fracture in the setting of recent injury. 1/8- CXR>>1. Bilateral mixed  interstitial and airspace opacities appear to have acutely worsened since the comparison imaging. May reflect worsening infectious consolidation, edema, and/or atelectasis given diminishing volumes.  CULTURES: SARS-CoV-2Ag1/5>>negative Influenza PCR 1/5>>negative SARS-CoV-2 PCR 1/5>>negative Blood cultures x2 1/5>> MRSA PCR 1/6>>negative SARS-CoV-2 PCR 1/8>>negative Influenza PCR 1/8>>negative Sputum 1/8>> Strep pneumo urinary antigen 1/8>> Legionella urinary antigen 1/8>>  ANTIBIOTICS: Azithromycin 1/6>>1/8 Doxycycline 1/8>> Cefepime1/8>> Vancomycin 1/8>>    CC  follow up respiratory failure  SUBJECTIVE Patient remains critically ill Prognosis is guarded Severe acute hypoxic resp failure High risk for dying  BP (!) 146/86   Pulse (!) 144   Temp 98.3 F (36.8 C) (Oral)   Resp 20   Ht 5\' 8"  (1.727 m)   Wt 93.2 kg   SpO2 100%   BMI 31.24 kg/m    I/O last 3 completed shifts: In: 1278.8 [P.O.:120; I.V.:222.4; IV Piggyback:936.5] Out: 2045 [Urine:2045] No intake/output data recorded.  SpO2: 100 % O2 Flow Rate (L/min): 50 L/min FiO2 (%): 95 %    REVIEW OF SYSTEMS  PATIENT IS UNABLE TO PROVIDE COMPLETE REVIEW OF SYSTEMS DUE TO SEVERE CRITICAL ILLNESS   PHYSICAL EXAMINATION:  GENERAL:critically ill appearing, +resp distress on biPAP HEAD: Normocephalic, atraumatic.  EYES: Pupils equal, round, reactive to light.  No scleral icterus.  MOUTH: Moist mucosal membrane. NECK: Supple.  PULMONARY: +rhonchi, +wheezing CARDIOVASCULAR: S1 and S2. Regular rate and rhythm. No murmurs, rubs, or gallops.  GASTROINTESTINAL: Soft, nontender, -distended.  Positive bowel sounds.   MUSCULOSKELETAL: No swelling, clubbing, or edema.  NEUROLOGIC: obtunded,  SKIN:intact,warm,dry  MEDICATIONS: I have reviewed all medications and confirmed regimen as documented   CULTURE RESULTS   Recent Results (from the past 240 hour(s))  Blood Culture (routine x 2)      Status: None   Collection Time: 12/24/2019 12:52 PM   Specimen: BLOOD  Result Value Ref Range Status   Specimen Description BLOOD BLOOD LEFT HAND  Final   Special Requests   Final  BOTTLES DRAWN AEROBIC AND ANAEROBIC Blood Culture results may not be optimal due to an excessive volume of blood received in culture bottles   Culture   Final    NO GROWTH 5 DAYS Performed at [facility], [address] [place], [place] [address]    Report Status 12/19/19 FINAL  Final  Blood Culture (routine x 2)     Status: None   Collection Time: 12/07/2019 12:57 PM   Specimen: BLOOD  Result Value Ref Range Status   Specimen Description BLOOD LEFT ANTECUBITAL  Final   Special Requests   Final    BOTTLES DRAWN AEROBIC AND ANAEROBIC Blood Culture adequate volume   Culture   Final    NO GROWTH 5 DAYS Performed at [facility], [address] [place], [place] [address]    Report Status 2019-12-19 FINAL  Final  Respiratory Panel by RT PCR (Flu A&B, Covid) - Nasopharyngeal Swab     Status: None   Collection Time: 12/25/2019  3:45 PM   Specimen: Nasopharyngeal Swab  Result Value Ref Range Status   SARS Coronavirus 2 by RT PCR NEGATIVE NEGATIVE Final    Comment: (NOTE) SARS-CoV-2 target nucleic acids are NOT DETECTED. The SARS-CoV-2 RNA is generally detectable in upper respiratoy specimens during the acute phase of infection. The lowest concentration of SARS-CoV-2 viral copies this assay can detect is 131 copies/mL. A negative result does not preclude SARS-Cov-2 infection and should not be used as the sole basis for treatment or other patient management decisions. A negative result may occur with  improper specimen collection/handling, submission of specimen other than nasopharyngeal swab, presence of viral mutation(s) within the areas targeted by this assay, and inadequate number of viral copies (<131 copies/mL). A negative result must be combined with clinical observations, patient  history, and epidemiological information. The expected result is Negative. Fact Sheet for Patients:  PinkCheek.be Fact Sheet for Healthcare Providers:  GravelBags.it This test is not yet ap proved or cleared by the [place] FDA and  has been authorized for detection and/or diagnosis of SARS-CoV-2 by FDA under an Emergency Use Authorization (EUA). This EUA will remain  in effect (meaning this test can be used) for the duration of the COVID-19 declaration under Section 564(b)(1) of the Act, 21 U.S.C. section 360bbb-3(b)(1), unless the authorization is terminated or revoked sooner.    Influenza A by PCR NEGATIVE NEGATIVE Final   Influenza B by PCR NEGATIVE NEGATIVE Final    Comment: (NOTE) The Xpert Xpress SARS-CoV-2/FLU/RSV assay is intended as an aid in  the diagnosis of influenza from Nasopharyngeal swab specimens and  should not be used as a sole basis for treatment. Nasal washings and  aspirates are unacceptable for Xpert Xpress SARS-CoV-2/FLU/RSV  testing. Fact Sheet for Patients: PinkCheek.be Fact Sheet for Healthcare Providers: GravelBags.it This test is not yet approved or cleared by the [place] FDA and  has been authorized for detection and/or diagnosis of SARS-CoV-2 by  FDA under an Emergency Use Authorization (EUA). This EUA will remain  in effect (meaning this test can be used) for the duration of the  Covid-19 declaration under Section 564(b)(1) of the Act, 21  U.S.C. section 360bbb-3(b)(1), unless the authorization is  terminated or revoked. Performed at [facility], [address] [place], [place] [address]   MRSA PCR Screening     Status: None   Collection Time: 12/05/19  9:52 PM   Specimen: Nasopharyngeal  Result Value Ref Range Status   MRSA by  PCR NEGATIVE NEGATIVE Final    Comment:        The GeneXpert MRSA Assay  (FDA approved for NASAL specimens only), is one component of a comprehensive MRSA colonization surveillance program. It is not intended to diagnose MRSA infection nor to guide or monitor treatment for MRSA infections. Performed at [facility], [place], [place] [address]   SARS CORONAVIRUS 2 (TAT 6-24 HRS) Nasopharyngeal Nasopharyngeal Swab     Status: None   Collection Time: 12/06/19  7:48 PM   Specimen: Nasopharyngeal Swab  Result Value Ref Range Status   SARS Coronavirus 2 NEGATIVE NEGATIVE Final    Comment: (NOTE) SARS-CoV-2 target nucleic acids are NOT DETECTED. The SARS-CoV-2 RNA is generally detectable in upper and lower respiratory specimens during the acute phase of infection. Negative results do not preclude SARS-CoV-2 infection, do not rule out co-infections with other pathogens, and should not be used as the sole basis for treatment or other patient management decisions. Negative results must be combined with clinical observations, patient history, and epidemiological information. The expected result is Negative. Fact Sheet for Patients: SugarRoll.be Fact Sheet for Healthcare Providers: https://www.woods-mathews.com/ This test is not yet approved or cleared by the [place] FDA and  has been authorized for detection and/or diagnosis of SARS-CoV-2 by FDA under an Emergency Use Authorization (EUA). This EUA will remain  in effect (meaning this test can be used) for the duration of the COVID-19 declaration under Section 56 4(b)(1) of the Act, 21 U.S.C. section 360bbb-3(b)(1), unless the authorization is terminated or revoked sooner. Performed at [facility], [address] [place], [place] [address]   Respiratory Panel by RT PCR (Flu A&B, Covid) - Nasopharyngeal Swab     Status: None   Collection Time: 12/07/19  3:30 AM   Specimen: Nasopharyngeal Swab  Result Value Ref Range Status    SARS Coronavirus 2 by RT PCR NEGATIVE NEGATIVE Final    Comment: (NOTE) SARS-CoV-2 target nucleic acids are NOT DETECTED. The SARS-CoV-2 RNA is generally detectable in upper respiratoy specimens during the acute phase of infection. The lowest concentration of SARS-CoV-2 viral copies this assay can detect is 131 copies/mL. A negative result does not preclude SARS-Cov-2 infection and should not be used as the sole basis for treatment or other patient management decisions. A negative result may occur with  improper specimen collection/handling, submission of specimen other than nasopharyngeal swab, presence of viral mutation(s) within the areas targeted by this assay, and inadequate number of viral copies (<131 copies/mL). A negative result must be combined with clinical observations, patient history, and epidemiological information. The expected result is Negative. Fact Sheet for Patients:  PinkCheek.be Fact Sheet for Healthcare Providers:  GravelBags.it This test is not yet ap proved or cleared by the [place] FDA and  has been authorized for detection and/or diagnosis of SARS-CoV-2 by FDA under an Emergency Use Authorization (EUA). This EUA will remain  in effect (meaning this test can be used) for the duration of the COVID-19 declaration under Section 564(b)(1) of the Act, 21 U.S.C. section 360bbb-3(b)(1), unless the authorization is terminated or revoked sooner.    Influenza A by PCR NEGATIVE NEGATIVE Final   Influenza B by PCR NEGATIVE NEGATIVE Final    Comment: (NOTE) The Xpert Xpress SARS-CoV-2/FLU/RSV assay is intended as an aid in  the diagnosis of influenza from Nasopharyngeal swab specimens and  should not be used as a sole basis for treatment. Nasal washings and  aspirates are  unacceptable for Xpert Xpress SARS-CoV-2/FLU/RSV  testing. Fact Sheet for Patients: PinkCheek.be Fact  Sheet for Healthcare Providers: GravelBags.it This test is not yet approved or cleared by the [place] FDA and  has been authorized for detection and/or diagnosis of SARS-CoV-2 by  FDA under an Emergency Use Authorization (EUA). This EUA will remain  in effect (meaning this test can be used) for the duration of the  Covid-19 declaration under Section 564(b)(1) of the Act, 21  U.S.C. section 360bbb-3(b)(1), unless the authorization is  terminated or revoked. Performed at [facility], [address] [place], [place] [address]         CBC    Component Value Date/Time   WBC 17.6 (H) 12-26-2019 0231   RBC 4.16 (L) Dec 26, 2019 0231   HGB 11.6 (L) 12/26/2019 0231   HGB 11.4 (L) 10/05/2018 0901   HCT 36.1 (L) 12/26/2019 0231   HCT 35.5 (L) 10/05/2018 0901   PLT 282 12-26-2019 0231   PLT 169 10/05/2018 0901   MCV 86.8 12-26-19 0231   MCV 89 10/05/2018 0901   MCV 84 10/24/2013 1235   MCH 27.9 12-26-2019 0231   MCHC 32.1 12-26-2019 0231   RDW 13.5 2019-12-26 0231   RDW 13.1 10/05/2018 0901   RDW 13.6 10/24/2013 1235   LYMPHSABS 0.8 2019/12/26 0231   LYMPHSABS 2.9 10/05/2018 0901   LYMPHSABS 3.2 12/01/2011 1436   MONOABS 1.1 (H) 12-26-19 0231   MONOABS 0.8 (H) 12/01/2011 1436   EOSABS 0.0 2019-12-26 0231   EOSABS 0.7 (H) 10/05/2018 0901   EOSABS 0.5 12/01/2011 1436   BASOSABS 0.0 December 26, 2019 0231   BASOSABS 0.1 10/05/2018 0901   BASOSABS 0.1 12/01/2011 1436   BMP Latest Ref Rng & Units 12/26/2019 12/08/2019 12/08/2019  Glucose 70 - 99 mg/dL 282(H) 245(H) 271(H)  BUN 8 - 23 mg/dL 34(H) 31(H) 35(H)  Creatinine 0.61 - 1.24 mg/dL 1.38(H) 1.19 1.32(H)  BUN/Creat Ratio 10 - 24 - - -  Sodium 135 - 145 mmol/L 133(L) 135 134(L)  Potassium 3.5 - 5.1 mmol/L 4.4 4.2 4.0  Chloride 98 - 111 mmol/L 97(L) 101 101  CO2 22 - 32 mmol/L 21(L) 22 23  Calcium 8.9 - 10.3 mg/dL 8.8(L) 8.6(L) 8.4(L)      IMAGING    [place] EKG SITE RITE  Result Date:  26-Dec-2019 If Site Rite image not attached, placement could not be confirmed due to current cardiac rhythm.      Indwelling Urinary Catheter continued, requirement due to   Reason to continue Indwelling Urinary Catheter strict Intake/Output monitoring for hemodynamic instability   Central Line/ continued, requirement due to  Reason to continue [place] of central venous pressure or other hemodynamic parameters and poor IV access   Ventilator continued, requirement due to severe respiratory failure   Ventilator Sedation RASS 0 to -2      ASSESSMENT AND PLAN SYNOPSIS  Severe ACUTE Hypoxic and Hypercapnic Respiratory Failure progressive ILD with atypical pneumonia, complicated by acute afib with RVR with underlying HFpEF High risk for intubation, cardiac arrest    Severe ACUTE Hypoxic and Hypercapnic Respiratory Failure On biPAP High risk for intubation and cardiac arrest   ACUTE  CARDIAC FAILURE- HFpEF Now c/w NSTEMI -oxygen as needed -Lasix as tolerated -follow up cardiac enzymes as indicated -follow up cardiology recs    ACUTE KIDNEY INJURY/Renal Failure -follow chem 7 -follow UO -continue Foley Catheter-assess need -Avoid nephrotoxic agents -Recheck creatinine    NEUROLOGY Severe delirium and agitation On precedex  CARDIAC ICU monitoring Acute afib with RVR and NSTEMI  ID -continue IV abx as prescibed -follow up cultures  GI GI PROPHYLAXIS as indicated  NUTRITIONAL STATUS DIET-->NPO Constipation protocol as indicated  ENDO - will use ICU hypoglycemic\Hyperglycemia protocol if indicated   ELECTROLYTES -follow labs as needed -replace as needed -pharmacy consultation and following   DVT/GI PRX ordered TRANSFUSIONS AS NEEDED MONITOR FSBS ASSESS the need for LABS as needed   Critical Care Time devoted to patient care services described in this note is 45 minutes.   Overall, patient is critically ill, prognosis is  guarded.  Patient with Multiorgan failure and at high risk for cardiac arrest and death.   RECOMMEND DNR STATUS AND COMFORT CARE MEASURES  Andilyn Bettcher Patricia Pesa, M.D.  [facility]  Medical Director [place] Director [facility] Cardio-Pulmonary Department

## 2019-12-31 NOTE — Progress Notes (Signed)
Family At bedside, clinical status relayed to family  Updated and notified of patients medical condition-  Progressive multiorgan failure with very low chance of meaningful recovery.  Patient is in dying  Process.  Family understands the situation.  They have consented and agreed to DNR/DNI and would like to proceed with Comfort care measures.  All family has arrived and visited, they now wish to pursue [place]   Family are satisfied with Plan of action and management. All questions answered  Corrin Counts, M.D.  [facility]  Medical Director [place] Director [facility] Cardio-Pulmonary Department

## 2019-12-31 NOTE — Death Summary Note (Signed)
DEATH SUMMARY   Patient Details  Name: Michael Lucas MRN: [id number] DOB: Mar 22, 1946  Admission/Discharge Information   Admit Date:  24-Dec-2019  Date of Death:   12-29-2019   Time of Death:  03-12-05  Length of Stay: 5  Referring Physician: Jerrol Banana., MD   Reason(s) for Hospitalization  74 year old male admitted 12/24/2019 with acute hypoxic respiratory failure felt to be attributed to acute decompensatedHFpEFand atrial fibrillation with RVR.CT scan with patchy groundglass infiltrates concerning for pulmonary edema versus atypical/viral pneumonia,but initial COVID-19 PCR is negative. Cardiology was consulted,butdespite aggressive diuresis he continues to have increased FiO2 requirements and rapid desaturations off oxygen.  SIGNIFICANT EVENTS 1/5-admission the MedSurg unit 1/5-[place] hit head; CTH post fall is negative 1/6- Dyspneic, hypoxic, with increasing FiO2 requirments,transfer to stepdown 1/8-persistent hypoxia despite aggressive diuresis, PCCM consulted 1/9 severe resp failure, placed on biPAP, placed on amiodarone, cardizem and precedex, patient dying process    STUDIES: 1/5-CTA chest>>No evidence of pulmonary emboli. Patchy ground-glass infiltrates with interstitial thickening likely related to patchy edema. Patient has a negative COVID-19 test. Atypical pneumonia may also be superimposed as well. Mediastinal and hilar adenopathy likely reactive given the appearance of the lungs. Aortic Atherosclerosis 1/5-CT head without contrast>>Chronic atrophic and ischemic changes without acute abnormality. 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 Question of a single punctate infarction at the left parietal vertex, not definite. If unilateral, this could be embolic disease from the heart, ascending aorta or right carotid system. If bilateral, this could be from the heart or ascending  aorta. Otherwise normal appearance of the brain for age. Abnormal appearance of the dens. The patient had a dens fracture in 03/12/10 and this may suffer from chronic nonunion. Cannot rule out repeat fracture in the setting of recent injury. 1/8- CXR>>1. Bilateral mixed interstitial and airspace opacities appear to have acutely worsened since the comparison imaging. May reflect worsening infectious consolidation, edema, and/or atelectasis given diminishing volumes.  CULTURES: SARS-CoV-2Ag1/5>>negative Influenza PCR 1/5>>negative SARS-CoV-2 PCR 1/5>>negative Blood cultures x2 1/5>> MRSA PCR 1/6>>negative SARS-CoV-2 PCR 1/8>>negative Influenza PCR 1/8>>negative Sputum 1/8>> Strep pneumo urinary antigen 1/8>> Legionella urinary antigen 1/8>>  ANTIBIOTICS: Azithromycin 1/6>>1/8 Doxycycline 1/8>> Cefepime1/8>> Vancomycin 1/8>>    Diagnoses  Preliminary cause of death: ischemic cardiomyopathy, PULM FIBROSIS, PNEUMONIA Secondary Diagnoses (including complications and co-morbidities):  Principal Problem:   Acute on chronic respiratory failure (HCC) Active Problems:   ATRIAL FIBRILLATION   Type 2 diabetes mellitus with diabetic polyneuropathy, with long-term current use of insulin (HCC)   Palliative care status   Family At bedside, clinical status relayed to family  Updated and notified of patients medical condition-  Progressive multiorgan failure with very low chance of meaningful recovery.  Patient is in dying  Process.  Family understands the situation.  They have consented and agreed to DNR/DNI and would like to proceed with Comfort care measures.   Family are satisfied with Plan of action and management. All questions answered      Pertinent Labs and Studies  Significant Diagnostic Studies CT HEAD WO CONTRAST  Result Date: Dec 24, 2019 CLINICAL DATA:  Headaches EXAM: CT HEAD WITHOUT CONTRAST TECHNIQUE: Contiguous axial images were obtained from the  base of the skull through the vertex without intravenous contrast. COMPARISON:  10/24/2013 FINDINGS: Brain: Mild atrophic changes and chronic white matter ischemic changes are seen. No findings to suggest acute hemorrhage, acute infarction or space-occupying mass lesion are noted. Vascular: No hyperdense vessel or unexpected calcification. Skull: Normal. Negative for fracture  or focal lesion. Sinuses/Orbits: No acute finding. Other: None. IMPRESSION: Chronic atrophic and ischemic changes without acute abnormality. Electronically Signed   By: Inez Catalina M.D.   On: 12/24/2019 20:46   CT ANGIO CHEST PE W OR WO CONTRAST  Result Date: 12/01/2019 CLINICAL DATA:  Hypoxemia EXAM: CT ANGIOGRAPHY CHEST WITH CONTRAST TECHNIQUE: Multidetector CT imaging of the chest was performed using the standard protocol during bolus administration of intravenous contrast. Multiplanar CT image reconstructions and MIPs were obtained to evaluate the vascular anatomy. CONTRAST:  72mL OMNIPAQUE IOHEXOL 350 MG/ML SOLN COMPARISON:  Chest x-ray from earlier in the same day. FINDINGS: Cardiovascular: Thoracic aorta demonstrates atherosclerotic calcifications without aneurysmal dilatation or dissection. Coronary calcifications are seen. No cardiac enlargement is seen. No pericardial effusion is noted. The pulmonary artery shows a normal branching pattern without intraluminal filling defect to suggest pulmonary embolism. Mediastinum/Nodes: Thoracic inlet is within normal limits. Scattered mediastinal adenopathy is noted most prominent in the region of the right paratracheal stripe. A dominant node is seen measuring 2 cm in short axis. Scattered smaller mediastinal nodes are seen. Subcarinal lymph node measures almost 18 mm in short axis. Small hilar lymph nodes are seen. These changes are increased from a prior CT from 2010. The esophagus is within normal limits. Lungs/Pleura: Some fibrotic changes are noted in the lungs bilaterally although  diffuse ground-glass opacities are seen with some interstitial thickening most consistent with atypical or viral pneumonia. Mild superimposed edema is likely present as well. No sizable effusion is noted. Upper Abdomen: Visualized upper abdomen is within normal limits. Musculoskeletal: Degenerative changes of the thoracic spine are seen. No acute rib abnormality is noted. Review of the MIP images confirms the above findings. IMPRESSION: No evidence of pulmonary emboli. Patchy ground-glass infiltrates with interstitial thickening likely related to patchy edema. Patient has a negative COVID-19 test. Atypical pneumonia may also be superimposed as well. Mediastinal and hilar adenopathy likely reactive given the appearance of the lungs. Aortic Atherosclerosis (ICD10-I70.0) and Emphysema (ICD10-J43.9). Electronically Signed   By: Inez Catalina M.D.   On: 12/10/2019 20:53   MR BRAIN WO CONTRAST  Result Date: 12/06/2019 CLINICAL DATA:  Head trauma.  Delayed recovery. EXAM: MRI HEAD WITHOUT CONTRAST TECHNIQUE: Multiplanar, multiecho pulse sequences of the brain and surrounding structures were obtained without intravenous contrast. COMPARISON:  CT studies 12/10/2019. MRI 08/21/2010. Cervical spine CT 08/21/2010 FINDINGS: Brain: Diffusion imaging shows 3 definite punctate acute infarctions in the right parietal vertex. There is question of a punctate acute infarction at the left parietal vertex, but this is not certain. Findings are consistent with micro embolic disease. If bilateral, this could be from the heart or ascending aorta. If unilateral, this could be from the right carotid bifurcation. Elsewhere, the brain shows mild age related volume loss. The brain does not show a widespread pattern of chronic small vessel disease. No large vessel territory infarction. No mass lesion, hemorrhage, hydrocephalus or extra-axial collection. Vascular: Major vessels at the base of the brain show flow. Skull and upper cervical spine:  Skull base appears negative. There is an abnormal appearance of the dens. The patient had a dens fracture in 2011 and this may be nonunited. Sinuses/Orbits: Clear/normal Other: None IMPRESSION: Three punctate acute infarctions at the right parietal vertex. Question of a single punctate infarction at the left parietal vertex, not definite. If unilateral, this could be embolic disease from the heart, ascending aorta or right carotid system. If bilateral, this could be from the heart or ascending aorta. Otherwise normal  appearance of the brain for age. Abnormal appearance of the dens. 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 Cannot rule out repeat fracture in the setting of recent injury. Electronically Signed   By: Nelson Chimes M.D.   On: 12/06/2019 21:26   DG Chest Port 1 View  Result Date: 26-Dec-2019 CLINICAL DATA:  Acute respiratory failure. EXAM: PORTABLE CHEST 1 VIEW COMPARISON:  12/07/2019 FINDINGS: The patient remains mildly rotated to the right. The cardiomediastinal silhouette is unchanged. Mixed interstitial and airspace opacities throughout both lungs are currently mildly greater on the left than the right and have decreased from the prior study. No sizeable pleural effusion or pneumothorax is identified. IMPRESSION: Improved but persistently extensive mixed interstitial and airspace opacity throughout both lungs. Electronically Signed   By: Logan Bores M.D.   On: Dec 26, 2019 08:14   DG Chest Port 1 View  Result Date: 12/07/2019 CLINICAL DATA:  Hypoxia EXAM: PORTABLE CHEST 1 VIEW COMPARISON:  CTA chest 12/03/2019 chest radiograph 12/23/2019 FINDINGS: Bilateral mixed interstitial and airspace opacities throughout both lungs appear to have acutely worsened since the comparison imaging lung volumes are also somewhat diminished suggesting some of this may be due to increasing atelectatic change. Much of the cardiomediastinal silhouette is obscured by overlying  opacity. Sternotomy wires are intact and aligned with few surgical clips projecting over the mediastinum. Telemetry leads overlie the chest. Vascular calcium noted in the base of the neck. No acute osseous or soft tissue abnormality. IMPRESSION: 1. Bilateral mixed interstitial and airspace opacities appear to have acutely worsened since the comparison imaging. May reflect worsening infectious consolidation, edema, and/or atelectasis given diminishing volumes. Electronically Signed   By: Lovena Le M.D.   On: 12/07/2019 02:32   DG Chest Port 1 View  Result Date: 12/11/2019 CLINICAL DATA:  Shortness of breath. EXAM: PORTABLE CHEST 1 VIEW COMPARISON:  Chest x-ray 10/24/2013. FINDINGS: Prior median sternotomy. Borderline cardiomegaly. Diffuse severe bilateral pulmonary infiltrates. No pleural effusion or pneumothorax. No acute bony abnormality. IMPRESSION: 1. Diffuse severe bilateral pulmonary infiltrates/edema. Bilateral multifocal pneumonia and or pulmonary edema could present this fashion. 2.  Prior median sternotomy.  Borderline cardiomegaly. Electronically Signed   By: Marcello Moores  Register   On: 12/29/2019 13:21   ECHOCARDIOGRAM COMPLETE  Result Date: 12/05/2019   ECHOCARDIOGRAM REPORT   Patient Name:   Michael Lucas Date of Exam: 12/05/2019 Medical Rec #:  [id number]      Height:       68.0 in Accession #:    [id number]     Weight:       207.1 lb Date of Birth:  01-01-1946      BSA:          2.07 m Patient Age:    57 years       BP:           145/112 mmHg Patient Gender: M              HR:           128 bpm. Exam Location:  [facility] Procedure: 2D Echo, Cardiac Doppler and Color Doppler Indications:     CHF- acute diastolic [id number]  History:         Patient has prior history of Echocardiogram examinations, most                  recent 05/04/2016. Signs/Symptoms:Murmur; Risk  Factors:Hypertension. PAF.  Sonographer:     Sherrie Sport RDCS (AE) Referring Phys:  Rachel Diagnosing Phys: Ida Rogue  MD  Sonographer Comments: No apical window and no subcostal window. Pt Could not wake up and communicate --- moving arms and legs during echo. IMPRESSIONS  1. Left ventricular ejection fraction, by visual estimation, is 60 to 65%. The left ventricle has normal function. There is no left ventricular hypertrophy.  2. The left ventricle has no regional wall motion abnormalities.  3. Global right ventricle has normal systolic function.The right ventricular size is normal. No increase in right ventricular wall thickness.  4. Left atrial size was moderately dilated.  5. TR signal is inadequate for assessing pulmonary artery systolic pressure. FINDINGS  Left Ventricle: Left ventricular ejection fraction, by visual estimation, is 60 to 65%. The left ventricle has normal function. The left ventricle has no regional wall motion abnormalities. There is no left ventricular hypertrophy. Normal left atrial pressure. Right Ventricle: The right ventricular size is normal. No increase in right ventricular wall thickness. Global RV systolic function is has normal systolic function. Left Atrium: Left atrial size was moderately dilated. Right Atrium: Right atrial size was normal in size Pericardium: There is no evidence of pericardial effusion. Mitral Valve: The mitral valve is normal in structure. Mild to moderate mitral valve regurgitation. No evidence of mitral valve stenosis by observation. Tricuspid Valve: The tricuspid valve is normal in structure. Tricuspid valve regurgitation is not demonstrated. Aortic Valve: The aortic valve was not well visualized. Aortic valve regurgitation is not visualized. Mild aortic valve sclerosis is present, with no evidence of aortic valve stenosis. Pulmonic Valve: The pulmonic valve was normal in structure. Pulmonic valve regurgitation is not visualized. Pulmonic regurgitation is not visualized. Aorta: The aortic root, ascending aorta and aortic arch are all structurally normal, with no evidence of  dilitation or obstruction. Venous: The inferior vena cava is normal in size with greater than 50% respiratory variability, suggesting right atrial pressure of 3 mmHg. IAS/Shunts: No atrial level shunt detected by color flow Doppler. There is no evidence of a patent foramen ovale. No ventricular septal defect is seen or detected. There is no evidence of an atrial septal defect.  LEFT VENTRICLE PLAX 2D LVIDd:         4.23 cm LVIDs:         2.81 cm LV PW:         1.22 cm LV IVS:        1.16 cm LVOT diam:     2.10 cm LV SV:         50 ml LV SV Index:   23.28 LVOT Area:     3.46 cm  LEFT ATRIUM         Index LA diam:    5.00 cm 2.41 cm/m                        PULMONIC VALVE AORTA                 PV Vmax:        0.61 m/s Ao Root diam: 3.30 cm PV Peak grad:   1.5 mmHg                       RVOT Peak grad: 4 mmHg   SHUNTS Systemic Diam: 2.10 cm  Ida Rogue MD Electronically signed by Ida Rogue MD Signature Date/Time: 12/05/2019/5:39:27 PM  Final    [place] EKG SITE RITE  Result Date: 01/04/20 If Site Rite image not attached, placement could not be confirmed due to current cardiac rhythm.   Microbiology Recent Results (from the past 240 hour(s))  Blood Culture (routine x 2)     Status: None   Collection Time: 12/16/2019 12:52 PM   Specimen: BLOOD  Result Value Ref Range Status   Specimen Description BLOOD BLOOD LEFT HAND  Final   Special Requests   Final    BOTTLES DRAWN AEROBIC AND ANAEROBIC Blood Culture results may not be optimal due to an excessive volume of blood received in culture bottles   Culture   Final    NO GROWTH 5 DAYS Performed at [facility], [address] [place], [place] [address]    Report Status 01/04/20 FINAL  Final  Blood Culture (routine x 2)     Status: None   Collection Time: 12/11/2019 12:57 PM   Specimen: BLOOD  Result Value Ref Range Status   Specimen Description BLOOD LEFT ANTECUBITAL  Final   Special Requests   Final    BOTTLES DRAWN AEROBIC AND  ANAEROBIC Blood Culture adequate volume   Culture   Final    NO GROWTH 5 DAYS Performed at [facility], [address] [place], [place] [address]    Report Status 01/04/20 FINAL  Final  Respiratory Panel by RT PCR (Flu A&B, Covid) - Nasopharyngeal Swab     Status: None   Collection Time: 12/08/2019  3:45 PM   Specimen: Nasopharyngeal Swab  Result Value Ref Range Status   SARS Coronavirus 2 by RT PCR NEGATIVE NEGATIVE Final    Comment: (NOTE) SARS-CoV-2 target nucleic acids are NOT DETECTED. The SARS-CoV-2 RNA is generally detectable in upper respiratoy specimens during the acute phase of infection. The lowest concentration of SARS-CoV-2 viral copies this assay can detect is 131 copies/mL. A negative result does not preclude SARS-Cov-2 infection and should not be used as the sole basis for treatment or other patient management decisions. A negative result may occur with  improper specimen collection/handling, submission of specimen other than nasopharyngeal swab, presence of viral mutation(s) within the areas targeted by this assay, and inadequate number of viral copies (<131 copies/mL). A negative result must be combined with clinical observations, patient history, and epidemiological information. The expected result is Negative. Fact Sheet for Patients:  PinkCheek.be Fact Sheet for Healthcare Providers:  GravelBags.it This test is not yet ap proved or cleared by the [place] FDA and  has been authorized for detection and/or diagnosis of SARS-CoV-2 by FDA under an Emergency Use Authorization (EUA). This EUA will remain  in effect (meaning this test can be used) for the duration of the COVID-19 declaration under Section 564(b)(1) of the Act, 21 U.S.C. section 360bbb-3(b)(1), unless the authorization is terminated or revoked sooner.    Influenza A by PCR NEGATIVE NEGATIVE Final   Influenza B by PCR NEGATIVE  NEGATIVE Final    Comment: (NOTE) The Xpert Xpress SARS-CoV-2/FLU/RSV assay is intended as an aid in  the diagnosis of influenza from Nasopharyngeal swab specimens and  should not be used as a sole basis for treatment. Nasal washings and  aspirates are unacceptable for Xpert Xpress SARS-CoV-2/FLU/RSV  testing. Fact Sheet for Patients: PinkCheek.be Fact Sheet for Healthcare Providers: GravelBags.it This test is not yet approved or cleared by the [place] FDA and  has been authorized for detection and/or diagnosis of SARS-CoV-2 by  FDA under an Emergency  Use Authorization (EUA). This EUA will remain  in effect (meaning this test can be used) for the duration of the  Covid-19 declaration under Section 564(b)(1) of the Act, 21  U.S.C. section 360bbb-3(b)(1), unless the authorization is  terminated or revoked. Performed at [facility], [address] [place], [place] [address]   MRSA PCR Screening     Status: None   Collection Time: 12/05/19  9:52 PM   Specimen: Nasopharyngeal  Result Value Ref Range Status   MRSA by PCR NEGATIVE NEGATIVE Final    Comment:        The GeneXpert MRSA Assay (FDA approved for NASAL specimens only), is one component of a comprehensive MRSA colonization surveillance program. It is not intended to diagnose MRSA infection nor to guide or monitor treatment for MRSA infections. Performed at [facility], [place], [place] [address]   SARS CORONAVIRUS 2 (TAT 6-24 HRS) Nasopharyngeal Nasopharyngeal Swab     Status: None   Collection Time: 12/06/19  7:48 PM   Specimen: Nasopharyngeal Swab  Result Value Ref Range Status   SARS Coronavirus 2 NEGATIVE NEGATIVE Final    Comment: (NOTE) SARS-CoV-2 target nucleic acids are NOT DETECTED. The SARS-CoV-2 RNA is generally detectable in upper and lower respiratory specimens during the acute phase of infection.  Negative results do not preclude SARS-CoV-2 infection, do not rule out co-infections with other pathogens, and should not be used as the sole basis for treatment or other patient management decisions. Negative results must be combined with clinical observations, patient history, and epidemiological information. The expected result is Negative. Fact Sheet for Patients: SugarRoll.be Fact Sheet for Healthcare Providers: https://www.woods-mathews.com/ This test is not yet approved or cleared by the [place] FDA and  has been authorized for detection and/or diagnosis of SARS-CoV-2 by FDA under an Emergency Use Authorization (EUA). This EUA will remain  in effect (meaning this test can be used) for the duration of the COVID-19 declaration under Section 56 4(b)(1) of the Act, 21 U.S.C. section 360bbb-3(b)(1), unless the authorization is terminated or revoked sooner. Performed at [facility], [place] [address], [place],  [address]   Respiratory Panel by RT PCR (Flu A&B, Covid) - Nasopharyngeal Swab     Status: None   Collection Time: 12/07/19  3:30 AM   Specimen: Nasopharyngeal Swab  Result Value Ref Range Status   SARS Coronavirus 2 by RT PCR NEGATIVE NEGATIVE Final    Comment: (NOTE) SARS-CoV-2 target nucleic acids are NOT DETECTED. The SARS-CoV-2 RNA is generally detectable in upper respiratoy specimens during the acute phase of infection. The lowest concentration of SARS-CoV-2 viral copies this assay can detect is 131 copies/mL. A negative result does not preclude SARS-Cov-2 infection and should not be used as the sole basis for treatment or other patient management decisions. A negative result may occur with  improper specimen collection/handling, submission of specimen other than nasopharyngeal swab, presence of viral mutation(s) within the areas targeted by this assay, and inadequate number of viral copies (<131 copies/mL). A  negative result must be combined with clinical observations, patient history, and epidemiological information. The expected result is Negative. Fact Sheet for Patients:  PinkCheek.be Fact Sheet for Healthcare Providers:  GravelBags.it This test is not yet ap proved or cleared by the [place] FDA and  has been authorized for detection and/or diagnosis of SARS-CoV-2 by FDA under an Emergency Use Authorization (EUA). This EUA will remain  in effect (meaning this test can be  used) for the duration of the COVID-19 declaration under Section 564(b)(1) of the Act, 21 U.S.C. section 360bbb-3(b)(1), unless the authorization is terminated or revoked sooner.    Influenza A by PCR NEGATIVE NEGATIVE Final   Influenza B by PCR NEGATIVE NEGATIVE Final    Comment: (NOTE) The Xpert Xpress SARS-CoV-2/FLU/RSV assay is intended as an aid in  the diagnosis of influenza from Nasopharyngeal swab specimens and  should not be used as a sole basis for treatment. Nasal washings and  aspirates are unacceptable for Xpert Xpress SARS-CoV-2/FLU/RSV  testing. Fact Sheet for Patients: PinkCheek.be Fact Sheet for Healthcare Providers: GravelBags.it This test is not yet approved or cleared by the [place] FDA and  has been authorized for detection and/or diagnosis of SARS-CoV-2 by  FDA under an Emergency Use Authorization (EUA). This EUA will remain  in effect (meaning this test can be used) for the duration of the  Covid-19 declaration under Section 564(b)(1) of the Act, 21  U.S.C. section 360bbb-3(b)(1), unless the authorization is  terminated or revoked. Performed at [facility], [address] [place], [place] [address]     Lab Basic Metabolic Panel: Recent Labs  Lab 12/05/19 [phone] 12/06/19 0256 12/07/19 0408 12/08/19 0503 12/08/19 1554 2019-12-16 0231  NA 132* 133*  134* 134* 135 133*  K 3.9 3.8 3.5 4.0 4.2 4.4  CL 97* 95* 96* 101 101 97*  CO2 24 27 26 23 22  21*  GLUCOSE 104* 165* 65* 271* 245* 282*  BUN 21 27* 37* 35* 31* 34*  CREATININE 1.29* 1.35* 1.44* 1.32* 1.19 1.38*  CALCIUM 8.3* 8.3* 8.4* 8.4* 8.6* 8.8*  MG 1.7  --  2.2 2.0 2.0  --    Liver Function Tests: Recent Labs  Lab 12/19/2019 1252 12/07/19 0408  AST 38 32  ALT 28 24  ALKPHOS 146* 172*  BILITOT 0.9 0.5  PROT 7.8 6.8  ALBUMIN 3.6 3.0*   No results for input(s): LIPASE, AMYLASE in the last 168 hours. No results for input(s): AMMONIA in the last 168 hours. CBC: Recent Labs  Lab 12/13/2019 1252 12/05/19 0313 12/06/19 0256 12/07/19 0408 12-16-2019 0231  WBC 10.7* 11.6* 12.6* 15.2* 17.6*  NEUTROABS 8.9*  --   --  12.8* 15.5*  HGB 11.5* 10.4* 10.7* 11.0* 11.6*  HCT 36.2* 31.4* 33.0* 33.9* 36.1*  MCV 87.7 86.3 86.6 85.8 86.8  PLT 297 260 270 282 282   Cardiac Enzymes: No results for input(s): CKTOTAL, CKMB, CKMBINDEX, TROPONINI in the last 168 hours. Sepsis Labs: Recent Labs  Lab 12/08/2019 1252 12/05/19 0313 12/06/19 0256 12/07/19 0408 12/16/2019 0231  PROCALCITON 0.21  --   --  1.07  --   WBC 10.7* 11.6* 12.6* 15.2* 17.6*  LATICACIDVEN 1.2  --   --   --   --       Michael Lucas 12/16/2019, 6:08 PM

## 2019-12-31 NOTE — Progress Notes (Signed)
Family At bedside, Clinical status relayed to family  Updated and notified of patients medical condition-  Progressive multiorgan failure with very low chance of meaningful recovery.  Patient is in dying  Process.  Family understands the situation.  They have consented and agreed to DNR/DNI and would like to proceed with Comfort care measures when rest of family is updated and arrives to bedside.    Family are satisfied with Plan of action and management. All questions answered  Corrin Sugarman, M.D.  [facility]  Medical Director [place] Director [facility] Cardio-Pulmonary Department

## 2019-12-31 NOTE — Progress Notes (Signed)
eLink Physician-Brief Progress Note Patient Name: Michael Lucas DOB: [birth year] MRN: [id number]   Date of Service  December 25, 2019  HPI/Events of Note  Called to assess patient for Afib with RVR to 160s associated with chest pain and worsened hypoxemia and respiratory distress.  Patient was on HFNC 90% Flow 50LPM satting about 89-90%.  CXR c/w severe bilateral pulmonary edema c/w decompensated CHF.   eICU Interventions  BiPAP ordered for mgmt of hypoxemic resp failure secondary to pulm edema.  Gave metoprolol 5mg  IV x3 with minimal effect on HR. Then gave diltiazem 10mg  x1 dose with excellent effect (HR now 100-110, Afib).  Lasix 40mg  IV given for pulmonary edema.  EKG STAT now (shows some ST depressions in V2-V3, no STE), then again in 15-30 mins to ensure any ischemic changes are improving with control of his heart rate.  Patient already reports that chest pain has resolved.     Intervention Category Major Interventions: Arrhythmia - evaluation and management;Hypoxemia - evaluation and management  Charlott Rakes 2019/12/25, 12:29 AM

## 2019-12-31 NOTE — Progress Notes (Signed)
Was called to bedside by nursing as patient became confused (oriented only to self) and trying to get out of bed.  Order was placed for stat ABG.  ABG revealed hypoxia (pH 7.45 / 33 / 62 / 22.9), ABG was drawn while patientt was on high flow nasal cannula.  Patient placed back on BiPAP.  After being placed on BiPAP, SPO2 saturations have increased to 96-100%, and patient now alert and oriented, no focal deficits noted.  With patient's confusion/agitation and hypoxia, he remains in atrial fibrillation with RVR despite amiodarone infusion.  We will change p.o. metoprolol to IV,  and will add Cardizem infusion.    Patient's daughter called to bedside.  Had long discussion with patient's daughter Eulis Canner of her fathers declining status including: Atrial fibrillation with RVR requiring amiodarone and Cardizem infusions, developing non-STEMI (already on medical therapy including antiplatelets and anticoagulation with Xarelto, beta-blocker, cardiology following, currently chest pain-free, and no ST elevation on EKG), and hypoxia requiring BiPAP.  Patient is currently tolerating BiPAP and appears comfortable.  Verdis Frederickson does report that her father has a history of PTSD and does not particularly like the BiPAP.  Will place orders for Precedex as needed to help patient tolerate BiPAP in an effort to avoid deterioration requiring intubation.  Had discussion with Verdis Frederickson regarding her father's CODE STATUS, and she reaffirms that he has made it known that he would like to be a FULL CODE for now.  Discussed with her that intubation is not necessitated at this current time, but given his decline, intubation may be inevitable at some point.  She understands his poor prognosis, and that if intubated he may very well never well wean from the ventilator.  She is in agreement with current plan.     Darel Hong, AGACNP-BC  Pulmonary & Critical Care Medicine Pager: [phone]

## 2019-12-31 DEATH — deceased
